# Patient Record
Sex: Male | Born: 1945 | Race: White | Hispanic: No | Marital: Married | Smoking: Never smoker
Health system: Southern US, Community
[De-identification: ages and names within clinical notes are randomized; demographics above are authoritative.]

## PROBLEM LIST (undated history)

## (undated) DIAGNOSIS — G4730 Sleep apnea, unspecified: Secondary | ICD-10-CM

## (undated) DIAGNOSIS — I10 Essential (primary) hypertension: Secondary | ICD-10-CM

## (undated) DIAGNOSIS — E7800 Pure hypercholesterolemia, unspecified: Secondary | ICD-10-CM

## (undated) DIAGNOSIS — G4733 Obstructive sleep apnea (adult) (pediatric): Secondary | ICD-10-CM

## (undated) DIAGNOSIS — J302 Other seasonal allergic rhinitis: Secondary | ICD-10-CM

## (undated) DIAGNOSIS — Z9289 Personal history of other medical treatment: Secondary | ICD-10-CM

## (undated) DIAGNOSIS — K219 Gastro-esophageal reflux disease without esophagitis: Secondary | ICD-10-CM

## (undated) DIAGNOSIS — R42 Dizziness and giddiness: Secondary | ICD-10-CM

## (undated) DIAGNOSIS — R011 Cardiac murmur, unspecified: Secondary | ICD-10-CM

## (undated) DIAGNOSIS — C8290 Follicular lymphoma, unspecified, unspecified site: Secondary | ICD-10-CM

## (undated) DIAGNOSIS — K649 Unspecified hemorrhoids: Secondary | ICD-10-CM

## (undated) HISTORY — DX: Sleep apnea, unspecified: G47.30

## (undated) HISTORY — PX: LYMPH NODE BIOPSY: SHX201

## (undated) HISTORY — PX: TONSILLECTOMY: SUR1361

## (undated) HISTORY — PX: COLONOSCOPY: SHX174

## (undated) HISTORY — DX: Unspecified hemorrhoids: K64.9

## (undated) HISTORY — DX: Gastro-esophageal reflux disease without esophagitis: K21.9

## (undated) HISTORY — PX: HEMORROIDECTOMY: SUR656

## (undated) HISTORY — PX: CATARACT EXTRACTION: SUR2

---

## 2007-04-07 DIAGNOSIS — C8290 Follicular lymphoma, unspecified, unspecified site: Secondary | ICD-10-CM

## 2007-04-07 HISTORY — DX: Follicular lymphoma, unspecified, unspecified site: C82.90

## 2011-02-20 ENCOUNTER — Ambulatory Visit: Payer: Self-pay | Admitting: Oncology

## 2011-02-24 ENCOUNTER — Ambulatory Visit: Payer: Self-pay | Admitting: Oncology

## 2011-03-07 ENCOUNTER — Ambulatory Visit: Payer: Self-pay | Admitting: Oncology

## 2011-04-07 ENCOUNTER — Ambulatory Visit: Payer: Self-pay | Admitting: Oncology

## 2011-04-21 ENCOUNTER — Ambulatory Visit: Payer: Self-pay | Admitting: Ophthalmology

## 2011-04-21 LAB — CREATININE, SERUM
Creatinine: 0.84 mg/dL (ref 0.60–1.30)
EGFR (African American): >60
EGFR (Non-African Amer.): >60

## 2011-04-29 ENCOUNTER — Ambulatory Visit: Payer: Self-pay | Admitting: Oncology

## 2011-05-08 ENCOUNTER — Ambulatory Visit: Payer: Self-pay | Admitting: Oncology

## 2011-05-20 LAB — CBC CANCER CENTER
Basophil #: 0 x10 3/mm (ref 0.0–0.1)
Eosinophil #: 0.1 x10 3/mm (ref 0.0–0.7)
Eosinophil %: 1.3 %
HGB: 14.2 g/dL (ref 13.0–18.0)
Lymphocyte %: 20.3 %
MCH: 30.8 pg (ref 26.0–34.0)
MCV: 90 fL (ref 80–100)
Monocyte %: 9.8 %
Neutrophil #: 3.8 x10 3/mm (ref 1.4–6.5)
Platelet: 181 x10 3/mm (ref 150–440)
RBC: 4.62 10*6/uL (ref 4.40–5.90)
RDW: 13.1 % (ref 11.5–14.5)

## 2011-05-27 LAB — CBC CANCER CENTER
Basophil #: 0 x10 3/mm (ref 0.0–0.1)
Eosinophil #: 0.1 x10 3/mm (ref 0.0–0.7)
Lymphocyte %: 21.4 %
MCHC: 34.1 g/dL (ref 32.0–36.0)
MCV: 90 fL (ref 80–100)
Neutrophil %: 66.5 %
RBC: 4.98 10*6/uL (ref 4.40–5.90)
RDW: 13.3 % (ref 11.5–14.5)

## 2011-06-03 LAB — CBC CANCER CENTER
Basophil %: 0.3 %
Eosinophil #: 0.1 x10 3/mm (ref 0.0–0.7)
Lymphocyte #: 1.3 x10 3/mm (ref 1.0–3.6)
Lymphocyte %: 19.3 %
MCHC: 34 g/dL (ref 32.0–36.0)
Monocyte #: 0.6 x10 3/mm (ref 0.0–0.7)
Monocyte %: 9.5 %
Neutrophil #: 4.6 x10 3/mm (ref 1.4–6.5)
Neutrophil %: 69.8 %
RBC: 4.77 10*6/uL (ref 4.40–5.90)
WBC: 6.6 x10 3/mm (ref 3.8–10.6)

## 2011-06-05 ENCOUNTER — Ambulatory Visit: Payer: Self-pay | Admitting: Oncology

## 2011-07-06 ENCOUNTER — Ambulatory Visit: Payer: Self-pay | Admitting: Oncology

## 2011-08-03 ENCOUNTER — Ambulatory Visit: Payer: Self-pay | Admitting: Oncology

## 2011-08-05 ENCOUNTER — Ambulatory Visit: Payer: Self-pay | Admitting: Oncology

## 2011-08-10 LAB — CBC CANCER CENTER
Basophil #: 0.1 x10 3/mm (ref 0.0–0.1)
Basophil %: 1.2 %
Eosinophil #: 0.1 x10 3/mm (ref 0.0–0.7)
Eosinophil %: 1.2 %
HCT: 42.3 % (ref 40.0–52.0)
HGB: 14.1 g/dL (ref 13.0–18.0)
MCHC: 33.3 g/dL (ref 32.0–36.0)
MCV: 91 fL (ref 80–100)
Monocyte #: 0.6 x10 3/mm (ref 0.2–1.0)
Neutrophil %: 68.9 %
Platelet: 181 x10 3/mm (ref 150–440)
WBC: 6.3 x10 3/mm (ref 3.8–10.6)

## 2011-08-10 LAB — LACTATE DEHYDROGENASE: LDH: 172 U/L (ref 87–241)

## 2011-09-05 ENCOUNTER — Ambulatory Visit: Payer: Self-pay | Admitting: Oncology

## 2011-10-05 ENCOUNTER — Ambulatory Visit: Payer: Self-pay | Admitting: Oncology

## 2011-11-05 ENCOUNTER — Ambulatory Visit: Payer: Self-pay | Admitting: Oncology

## 2011-12-14 ENCOUNTER — Ambulatory Visit: Payer: Self-pay | Admitting: Oncology

## 2011-12-16 ENCOUNTER — Ambulatory Visit: Payer: Self-pay | Admitting: Oncology

## 2011-12-16 LAB — CBC CANCER CENTER
Basophil #: 0.1 x10 3/mm (ref 0.0–0.1)
Basophil %: 1.2 %
Eosinophil #: 0.1 x10 3/mm (ref 0.0–0.7)
HCT: 36.3 % — ABNORMAL LOW (ref 40.0–52.0)
HGB: 11.6 g/dL — ABNORMAL LOW (ref 13.0–18.0)
Lymphocyte #: 1.1 x10 3/mm (ref 1.0–3.6)
Lymphocyte %: 16.2 %
MCHC: 32 g/dL (ref 32.0–36.0)
MCV: 88 fL (ref 80–100)
Monocyte %: 8.1 %
Neutrophil #: 4.8 x10 3/mm (ref 1.4–6.5)
Neutrophil %: 72.9 %
RBC: 4.15 10*6/uL — ABNORMAL LOW (ref 4.40–5.90)
WBC: 6.6 x10 3/mm (ref 3.8–10.6)

## 2011-12-16 LAB — LACTATE DEHYDROGENASE: LDH: 148 U/L (ref 81–234)

## 2012-01-05 ENCOUNTER — Ambulatory Visit: Payer: Self-pay | Admitting: Oncology

## 2012-01-11 ENCOUNTER — Ambulatory Visit: Payer: Self-pay | Admitting: Unknown Physician Specialty

## 2012-01-12 LAB — PATHOLOGY REPORT

## 2012-02-05 ENCOUNTER — Ambulatory Visit: Payer: Self-pay | Admitting: Oncology

## 2012-03-06 ENCOUNTER — Ambulatory Visit: Payer: Self-pay | Admitting: Oncology

## 2012-04-06 ENCOUNTER — Ambulatory Visit: Payer: Self-pay | Admitting: Oncology

## 2012-05-07 ENCOUNTER — Ambulatory Visit: Payer: Self-pay | Admitting: Oncology

## 2012-06-08 ENCOUNTER — Ambulatory Visit: Payer: Self-pay | Admitting: Oncology

## 2012-06-14 ENCOUNTER — Ambulatory Visit: Payer: Self-pay | Admitting: Oncology

## 2012-06-14 LAB — CBC CANCER CENTER
Basophil %: 1.4 %
Eosinophil #: 0.1 x10 3/mm (ref 0.0–0.7)
Lymphocyte #: 1.4 x10 3/mm (ref 1.0–3.6)
Lymphocyte %: 24.9 %
MCHC: 33.4 g/dL (ref 32.0–36.0)
RDW: 15.1 % — ABNORMAL HIGH (ref 11.5–14.5)
WBC: 5.5 x10 3/mm (ref 3.8–10.6)

## 2012-06-14 LAB — BASIC METABOLIC PANEL
Anion Gap: 10 (ref 7–16)
Chloride: 104 mmol/L (ref 98–107)
Co2: 28 mmol/L (ref 21–32)
EGFR (African American): >60
EGFR (Non-African Amer.): >60
Glucose: 103 mg/dL — ABNORMAL HIGH (ref 65–99)
Potassium: 3.5 mmol/L (ref 3.5–5.1)
Sodium: 142 mmol/L (ref 136–145)

## 2012-06-14 LAB — LACTATE DEHYDROGENASE: LDH: 159 U/L (ref 85–241)

## 2012-07-05 ENCOUNTER — Ambulatory Visit: Payer: Self-pay | Admitting: Oncology

## 2012-08-04 ENCOUNTER — Ambulatory Visit: Payer: Self-pay | Admitting: Oncology

## 2012-08-22 ENCOUNTER — Ambulatory Visit: Payer: Self-pay | Admitting: Oncology

## 2012-08-24 ENCOUNTER — Ambulatory Visit: Payer: Self-pay | Admitting: Oncology

## 2012-09-04 ENCOUNTER — Ambulatory Visit: Payer: Self-pay | Admitting: Oncology

## 2012-09-09 LAB — CBC CANCER CENTER
Bands: 2 %
Comment - H1-Com1: NORMAL
HGB: 14.3 g/dL (ref 13.0–18.0)
MCH: 30.7 pg (ref 26.0–34.0)
MCHC: 34.6 g/dL (ref 32.0–36.0)
Platelet: 209 x10 3/mm (ref 150–440)
RDW: 13.1 % (ref 11.5–14.5)
WBC: 7.6 x10 3/mm (ref 3.8–10.6)

## 2012-09-20 LAB — BASIC METABOLIC PANEL
Anion Gap: 8 (ref 7–16)
BUN: 8 mg/dL (ref 7–18)
Calcium, Total: 8.7 mg/dL (ref 8.5–10.1)
Co2: 27 mmol/L (ref 21–32)
Glucose: 183 mg/dL — ABNORMAL HIGH (ref 65–99)
Osmolality: 279 (ref 275–301)
Sodium: 138 mmol/L (ref 136–145)

## 2012-09-20 LAB — CBC CANCER CENTER
Lymphocyte #: 1.4 x10 3/mm (ref 1.0–3.6)
MCHC: 34.5 g/dL (ref 32.0–36.0)
MCV: 89 fL (ref 80–100)
Monocyte %: 7.9 %
Neutrophil %: 70.1 %
WBC: 7.5 x10 3/mm (ref 3.8–10.6)

## 2012-09-29 LAB — CBC CANCER CENTER
Basophil #: 0 x10 3/mm (ref 0.0–0.1)
Basophil %: 0.6 %
Eosinophil #: 0.1 x10 3/mm (ref 0.0–0.7)
HGB: 13.4 g/dL (ref 13.0–18.0)
Lymphocyte #: 1.1 x10 3/mm (ref 1.0–3.6)
MCV: 89 fL (ref 80–100)
Monocyte #: 0.7 x10 3/mm (ref 0.2–1.0)
Neutrophil %: 71.3 %
RDW: 13.1 % (ref 11.5–14.5)

## 2012-10-04 ENCOUNTER — Ambulatory Visit: Payer: Self-pay | Admitting: Oncology

## 2012-10-05 LAB — CBC CANCER CENTER
Basophil #: 0.1 x10 3/mm (ref 0.0–0.1)
Basophil %: 1 %
HCT: 43 % (ref 40.0–52.0)
HGB: 14.3 g/dL (ref 13.0–18.0)
MCH: 29.2 pg (ref 26.0–34.0)
MCHC: 33.2 g/dL (ref 32.0–36.0)
Neutrophil #: 5.2 x10 3/mm (ref 1.4–6.5)
Neutrophil %: 84.1 %
RDW: 13.4 % (ref 11.5–14.5)
WBC: 6.2 x10 3/mm (ref 3.8–10.6)

## 2012-10-12 LAB — CBC CANCER CENTER
Basophil #: 0.1 x10 3/mm (ref 0.0–0.1)
Basophil %: 1.3 %
Eosinophil %: 0.9 %
HCT: 43.9 % (ref 40.0–52.0)
HGB: 15.1 g/dL (ref 13.0–18.0)
Lymphocyte %: 12.7 %
MCV: 89 fL (ref 80–100)
Monocyte #: 0.6 x10 3/mm (ref 0.2–1.0)
Neutrophil %: 70.2 %
RBC: 4.94 10*6/uL (ref 4.40–5.90)
RDW: 13.6 % (ref 11.5–14.5)
WBC: 4.3 x10 3/mm (ref 3.8–10.6)

## 2012-10-19 LAB — CBC CANCER CENTER
Basophil #: 0.1 x10 3/mm (ref 0.0–0.1)
Basophil %: 1.4 %
Eosinophil #: 0 x10 3/mm (ref 0.0–0.7)
HCT: 42.3 % (ref 40.0–52.0)
HGB: 14.5 g/dL (ref 13.0–18.0)
Lymphocyte %: 14.5 %
MCHC: 34.3 g/dL (ref 32.0–36.0)
MCV: 89 fL (ref 80–100)
Monocyte %: 14.6 %
Neutrophil %: 68.4 %
Platelet: 96 x10 3/mm — ABNORMAL LOW (ref 150–440)
RDW: 14.3 % (ref 11.5–14.5)

## 2012-10-26 LAB — CBC CANCER CENTER
Basophil #: 0 x10 3/mm (ref 0.0–0.1)
Basophil %: 1.1 %
Eosinophil #: 0.1 x10 3/mm (ref 0.0–0.7)
HGB: 14.2 g/dL (ref 13.0–18.0)
MCH: 30.7 pg (ref 26.0–34.0)
MCHC: 34.4 g/dL (ref 32.0–36.0)
MCV: 89 fL (ref 80–100)
Monocyte #: 0.4 x10 3/mm (ref 0.2–1.0)
Monocyte %: 8.7 %
Platelet: 61 x10 3/mm — ABNORMAL LOW (ref 150–440)
RBC: 4.63 10*6/uL (ref 4.40–5.90)
WBC: 4.4 x10 3/mm (ref 3.8–10.6)

## 2012-11-02 LAB — CBC CANCER CENTER
Basophil %: 0.7 %
Eosinophil #: 0.1 x10 3/mm (ref 0.0–0.7)
Eosinophil %: 2.1 %
HGB: 13.8 g/dL (ref 13.0–18.0)
MCHC: 34.6 g/dL (ref 32.0–36.0)
MCV: 90 fL (ref 80–100)
Monocyte #: 0.4 x10 3/mm (ref 0.2–1.0)
Platelet: 57 x10 3/mm — ABNORMAL LOW (ref 150–440)
RBC: 4.41 10*6/uL (ref 4.40–5.90)
RDW: 15.1 % — ABNORMAL HIGH (ref 11.5–14.5)
WBC: 2.8 x10 3/mm — ABNORMAL LOW (ref 3.8–10.6)

## 2012-11-04 ENCOUNTER — Ambulatory Visit: Payer: Self-pay | Admitting: Oncology

## 2012-11-09 LAB — CBC CANCER CENTER
Eosinophil #: 0 x10 3/mm (ref 0.0–0.7)
HCT: 37.2 % — ABNORMAL LOW (ref 40.0–52.0)
Lymphocyte %: 31.7 %
MCH: 31.3 pg (ref 26.0–34.0)
MCHC: 35.2 g/dL (ref 32.0–36.0)
Monocyte %: 21.8 %
Neutrophil #: 0.8 x10 3/mm — ABNORMAL LOW (ref 1.4–6.5)
WBC: 1.9 x10 3/mm — CL (ref 3.8–10.6)

## 2012-11-16 LAB — CBC CANCER CENTER
Basophil #: 0 x10 3/mm (ref 0.0–0.1)
Basophil %: 1 %
Eosinophil #: 0 x10 3/mm (ref 0.0–0.7)
Eosinophil %: 0.4 %
HCT: 39.2 % — ABNORMAL LOW (ref 40.0–52.0)
HGB: 13.8 g/dL (ref 13.0–18.0)
Lymphocyte #: 0.7 x10 3/mm — ABNORMAL LOW (ref 1.0–3.6)
Lymphocyte %: 27.1 %
MCH: 31.7 pg (ref 26.0–34.0)
MCHC: 35.2 g/dL (ref 32.0–36.0)
MCV: 90 fL (ref 80–100)
Neutrophil #: 1.3 x10 3/mm — ABNORMAL LOW (ref 1.4–6.5)
Neutrophil %: 48.4 %
Platelet: 130 x10 3/mm — ABNORMAL LOW (ref 150–440)
RBC: 4.35 10*6/uL — ABNORMAL LOW (ref 4.40–5.90)
RDW: 16.3 % — ABNORMAL HIGH (ref 11.5–14.5)
WBC: 2.8 x10 3/mm — ABNORMAL LOW (ref 3.8–10.6)

## 2012-11-23 LAB — CBC CANCER CENTER
Eosinophil #: 0 x10 3/mm (ref 0.0–0.7)
Eosinophil %: 0.3 %
HCT: 39.8 % — ABNORMAL LOW (ref 40.0–52.0)
MCH: 31.7 pg (ref 26.0–34.0)
MCV: 91 fL (ref 80–100)
Monocyte #: 0.8 x10 3/mm (ref 0.2–1.0)
Monocyte %: 20 %
Neutrophil #: 2.3 x10 3/mm (ref 1.4–6.5)
Platelet: 181 x10 3/mm (ref 150–440)
RBC: 4.37 10*6/uL — ABNORMAL LOW (ref 4.40–5.90)
WBC: 3.8 x10 3/mm (ref 3.8–10.6)

## 2012-11-29 LAB — CBC CANCER CENTER
Basophil #: 0 x10 3/mm (ref 0.0–0.1)
Eosinophil #: 0 x10 3/mm (ref 0.0–0.7)
Eosinophil %: 0.5 %
Lymphocyte #: 0.8 x10 3/mm — ABNORMAL LOW (ref 1.0–3.6)
Lymphocyte %: 20.4 %
MCH: 31.5 pg (ref 26.0–34.0)
MCHC: 34.6 g/dL (ref 32.0–36.0)
Monocyte #: 0.8 x10 3/mm (ref 0.2–1.0)
RDW: 17.6 % — ABNORMAL HIGH (ref 11.5–14.5)
WBC: 4 x10 3/mm (ref 3.8–10.6)

## 2012-12-05 ENCOUNTER — Ambulatory Visit: Payer: Self-pay | Admitting: Oncology

## 2012-12-07 LAB — CBC CANCER CENTER
Basophil #: 0 x10 3/mm (ref 0.0–0.1)
Eosinophil #: 0 x10 3/mm (ref 0.0–0.7)
Eosinophil %: 0.6 %
HCT: 39 % — ABNORMAL LOW (ref 40.0–52.0)
Lymphocyte #: 0.8 x10 3/mm — ABNORMAL LOW (ref 1.0–3.6)
MCH: 31.8 pg (ref 26.0–34.0)
MCHC: 34.6 g/dL (ref 32.0–36.0)
Monocyte #: 0.7 x10 3/mm (ref 0.2–1.0)
Monocyte %: 12.9 %
Neutrophil #: 3.6 x10 3/mm (ref 1.4–6.5)
Neutrophil %: 69.6 %
Platelet: 181 x10 3/mm (ref 150–440)
RBC: 4.24 10*6/uL — ABNORMAL LOW (ref 4.40–5.90)
RDW: 17.5 % — ABNORMAL HIGH (ref 11.5–14.5)
WBC: 5.1 x10 3/mm (ref 3.8–10.6)

## 2012-12-14 LAB — BASIC METABOLIC PANEL
Anion Gap: 9 (ref 7–16)
Calcium, Total: 8.9 mg/dL (ref 8.5–10.1)
Chloride: 106 mmol/L (ref 98–107)
Co2: 29 mmol/L (ref 21–32)
EGFR (African American): >60
EGFR (Non-African Amer.): >60
Glucose: 102 mg/dL — ABNORMAL HIGH (ref 65–99)

## 2012-12-14 LAB — CBC CANCER CENTER
Basophil #: 0.1 x10 3/mm (ref 0.0–0.1)
Eosinophil #: 0.1 x10 3/mm (ref 0.0–0.7)
Eosinophil %: 1.5 %
HGB: 13.8 g/dL (ref 13.0–18.0)
Lymphocyte %: 13.1 %
MCH: 31.5 pg (ref 26.0–34.0)
MCHC: 34.1 g/dL (ref 32.0–36.0)
MCV: 92 fL (ref 80–100)
Monocyte #: 0.8 x10 3/mm (ref 0.2–1.0)
Monocyte %: 14 %
Neutrophil %: 70.2 %
RBC: 4.4 10*6/uL (ref 4.40–5.90)
RDW: 17 % — ABNORMAL HIGH (ref 11.5–14.5)

## 2012-12-14 LAB — LACTATE DEHYDROGENASE: LDH: 182 U/L (ref 85–241)

## 2012-12-21 LAB — CBC CANCER CENTER
Eosinophil %: 2.1 %
HCT: 40.6 % (ref 40.0–52.0)
Lymphocyte %: 18.7 %
MCH: 31.8 pg (ref 26.0–34.0)
MCHC: 34.1 g/dL (ref 32.0–36.0)
Monocyte #: 0.9 x10 3/mm (ref 0.2–1.0)
Monocyte %: 15.3 %
Neutrophil %: 63.2 %
Platelet: 175 x10 3/mm (ref 150–440)
WBC: 5.6 x10 3/mm (ref 3.8–10.6)

## 2012-12-29 ENCOUNTER — Ambulatory Visit: Payer: Self-pay | Admitting: Oncology

## 2013-01-04 ENCOUNTER — Ambulatory Visit: Payer: Self-pay | Admitting: Oncology

## 2013-02-04 ENCOUNTER — Ambulatory Visit: Payer: Self-pay | Admitting: Oncology

## 2013-02-14 ENCOUNTER — Ambulatory Visit: Payer: Self-pay | Admitting: Internal Medicine

## 2013-02-15 ENCOUNTER — Ambulatory Visit: Payer: Self-pay | Admitting: Oncology

## 2013-02-23 ENCOUNTER — Ambulatory Visit: Payer: Self-pay | Admitting: Unknown Physician Specialty

## 2013-03-06 ENCOUNTER — Ambulatory Visit: Payer: Self-pay | Admitting: Oncology

## 2013-04-06 ENCOUNTER — Ambulatory Visit: Payer: Self-pay | Admitting: Oncology

## 2013-04-10 ENCOUNTER — Ambulatory Visit: Payer: Self-pay | Admitting: Oncology

## 2013-04-19 ENCOUNTER — Ambulatory Visit: Payer: Self-pay | Admitting: Oncology

## 2013-04-19 LAB — CBC CANCER CENTER
BASOS PCT: 1.4 %
Basophil #: 0.1 x10 3/mm (ref 0.0–0.1)
Eosinophil #: 0.1 x10 3/mm (ref 0.0–0.7)
Eosinophil %: 1.6 %
HCT: 42.1 % (ref 40.0–52.0)
HGB: 14.1 g/dL (ref 13.0–18.0)
LYMPHS ABS: 1.2 x10 3/mm (ref 1.0–3.6)
LYMPHS PCT: 22.4 %
MCH: 30.8 pg (ref 26.0–34.0)
MCHC: 33.4 g/dL (ref 32.0–36.0)
MCV: 92 fL (ref 80–100)
MONOS PCT: 8.9 %
Monocyte #: 0.5 x10 3/mm (ref 0.2–1.0)
NEUTROS ABS: 3.5 x10 3/mm (ref 1.4–6.5)
Neutrophil %: 65.7 %
Platelet: 158 x10 3/mm (ref 150–440)
RBC: 4.57 10*6/uL (ref 4.40–5.90)
RDW: 12.3 % (ref 11.5–14.5)
WBC: 5.3 x10 3/mm (ref 3.8–10.6)

## 2013-04-19 LAB — COMPREHENSIVE METABOLIC PANEL
Albumin: 3.9 g/dL (ref 3.4–5.0)
Alkaline Phosphatase: 81 U/L
Anion Gap: 10 (ref 7–16)
BILIRUBIN TOTAL: 0.6 mg/dL (ref 0.2–1.0)
BUN: 10 mg/dL (ref 7–18)
CALCIUM: 8.7 mg/dL (ref 8.5–10.1)
Chloride: 103 mmol/L (ref 98–107)
Co2: 29 mmol/L (ref 21–32)
Creatinine: 0.95 mg/dL (ref 0.60–1.30)
EGFR (African American): >60
EGFR (Non-African Amer.): >60
Glucose: 95 mg/dL (ref 65–99)
OSMOLALITY: 282 (ref 275–301)
POTASSIUM: 3.5 mmol/L (ref 3.5–5.1)
SGOT(AST): 25 U/L (ref 15–37)
SGPT (ALT): 33 U/L (ref 12–78)
Sodium: 142 mmol/L (ref 136–145)
TOTAL PROTEIN: 6.4 g/dL (ref 6.4–8.2)

## 2013-04-19 LAB — LACTATE DEHYDROGENASE: LDH: 148 U/L (ref 85–241)

## 2013-05-07 ENCOUNTER — Ambulatory Visit: Payer: Self-pay | Admitting: Oncology

## 2013-06-04 ENCOUNTER — Ambulatory Visit: Payer: Self-pay | Admitting: Oncology

## 2013-07-12 ENCOUNTER — Ambulatory Visit: Payer: Self-pay | Admitting: Oncology

## 2013-07-12 LAB — CBC CANCER CENTER
Basophil #: 0.1 10*3/uL
Basophil %: 1.4 %
Eosinophil #: 0.1 10*3/uL
Eosinophil %: 1.1 %
HCT: 41 %
HGB: 13.7 g/dL
Lymphocyte %: 22.2 %
Lymphs Abs: 1.1 10*3/uL
MCH: 31 pg
MCHC: 33.5 g/dL
MCV: 92 fL
Monocyte #: 0.4 10*3/uL
Monocyte %: 8.7 %
Neutrophil #: 3.2 10*3/uL
Neutrophil %: 66.6 %
Platelet: 153 10*3/uL
RBC: 4.43 10*6/uL
RDW: 13.2 %
WBC: 4.8 10*3/uL

## 2013-07-12 LAB — LACTATE DEHYDROGENASE: LDH: 139 U/L

## 2013-08-04 ENCOUNTER — Ambulatory Visit: Payer: Self-pay | Admitting: Oncology

## 2013-08-22 DIAGNOSIS — K219 Gastro-esophageal reflux disease without esophagitis: Secondary | ICD-10-CM | POA: Insufficient documentation

## 2013-08-22 DIAGNOSIS — G4730 Sleep apnea, unspecified: Secondary | ICD-10-CM | POA: Insufficient documentation

## 2013-08-22 DIAGNOSIS — I10 Essential (primary) hypertension: Secondary | ICD-10-CM | POA: Insufficient documentation

## 2013-08-22 DIAGNOSIS — E785 Hyperlipidemia, unspecified: Secondary | ICD-10-CM | POA: Insufficient documentation

## 2013-08-22 DIAGNOSIS — C8590 Non-Hodgkin lymphoma, unspecified, unspecified site: Secondary | ICD-10-CM | POA: Insufficient documentation

## 2013-08-22 DIAGNOSIS — C859A Non-Hodgkin lymphoma, unspecified, in remission: Secondary | ICD-10-CM | POA: Insufficient documentation

## 2013-09-04 ENCOUNTER — Ambulatory Visit: Payer: Self-pay | Admitting: Oncology

## 2013-10-04 ENCOUNTER — Ambulatory Visit: Payer: Self-pay | Admitting: Oncology

## 2013-10-25 ENCOUNTER — Ambulatory Visit: Payer: Self-pay | Admitting: Ophthalmology

## 2013-11-14 ENCOUNTER — Ambulatory Visit: Payer: Self-pay | Admitting: Oncology

## 2013-11-27 ENCOUNTER — Ambulatory Visit: Payer: Self-pay | Admitting: Oncology

## 2013-11-27 LAB — BASIC METABOLIC PANEL
Anion Gap: 8 (ref 7–16)
BUN: 10 mg/dL (ref 7–18)
CHLORIDE: 104 mmol/L (ref 98–107)
CREATININE: 1.05 mg/dL (ref 0.60–1.30)
Calcium, Total: 8.8 mg/dL (ref 8.5–10.1)
Co2: 29 mmol/L (ref 21–32)
EGFR (African American): >60
Glucose: 105 mg/dL — ABNORMAL HIGH (ref 65–99)
OSMOLALITY: 281 (ref 275–301)
POTASSIUM: 3.9 mmol/L (ref 3.5–5.1)
SODIUM: 141 mmol/L (ref 136–145)

## 2013-11-27 LAB — CBC CANCER CENTER
Basophil #: 0 x10 3/mm (ref 0.0–0.1)
Basophil %: 0.6 %
EOS ABS: 0.1 x10 3/mm (ref 0.0–0.7)
EOS PCT: 1.8 %
HCT: 43 % (ref 40.0–52.0)
HGB: 14.4 g/dL (ref 13.0–18.0)
Lymphocyte #: 1.7 x10 3/mm (ref 1.0–3.6)
Lymphocyte %: 27.3 %
MCH: 30.6 pg (ref 26.0–34.0)
MCHC: 33.4 g/dL (ref 32.0–36.0)
MCV: 92 fL (ref 80–100)
Monocyte #: 0.6 x10 3/mm (ref 0.2–1.0)
Monocyte %: 10.5 %
NEUTROS ABS: 3.6 x10 3/mm (ref 1.4–6.5)
NEUTROS PCT: 59.8 %
Platelet: 153 x10 3/mm (ref 150–440)
RBC: 4.7 10*6/uL (ref 4.40–5.90)
RDW: 12.9 % (ref 11.5–14.5)
WBC: 6 x10 3/mm (ref 3.8–10.6)

## 2013-11-27 LAB — LACTATE DEHYDROGENASE: LDH: 165 U/L (ref 85–241)

## 2013-12-05 ENCOUNTER — Ambulatory Visit: Payer: Self-pay | Admitting: Oncology

## 2014-01-04 ENCOUNTER — Ambulatory Visit: Payer: Self-pay | Admitting: Oncology

## 2014-02-07 ENCOUNTER — Ambulatory Visit: Payer: Self-pay | Admitting: Oncology

## 2014-03-06 ENCOUNTER — Ambulatory Visit: Payer: Self-pay | Admitting: Oncology

## 2014-03-07 LAB — BASIC METABOLIC PANEL
Anion Gap: 9 (ref 7–16)
BUN: 9 mg/dL (ref 7–18)
Calcium, Total: 8.6 mg/dL (ref 8.5–10.1)
Chloride: 106 mmol/L (ref 98–107)
Co2: 27 mmol/L (ref 21–32)
Creatinine: 1.06 mg/dL (ref 0.60–1.30)
EGFR (African American): >60
Glucose: 100 mg/dL — ABNORMAL HIGH (ref 65–99)
Osmolality: 282 (ref 275–301)
Potassium: 3.7 mmol/L (ref 3.5–5.1)
SODIUM: 142 mmol/L (ref 136–145)

## 2014-03-07 LAB — CBC CANCER CENTER
BASOS ABS: 0.1 x10 3/mm (ref 0.0–0.1)
Basophil %: 1.1 %
EOS ABS: 0.1 x10 3/mm (ref 0.0–0.7)
Eosinophil %: 0.9 %
HCT: 42.5 % (ref 40.0–52.0)
HGB: 14 g/dL (ref 13.0–18.0)
Lymphocyte #: 1.2 x10 3/mm (ref 1.0–3.6)
Lymphocyte %: 19.4 %
MCH: 30.2 pg (ref 26.0–34.0)
MCHC: 32.8 g/dL (ref 32.0–36.0)
MCV: 92 fL (ref 80–100)
Monocyte #: 0.5 x10 3/mm (ref 0.2–1.0)
Monocyte %: 8.4 %
Neutrophil #: 4.3 x10 3/mm (ref 1.4–6.5)
Neutrophil %: 70.2 %
Platelet: 145 x10 3/mm — ABNORMAL LOW (ref 150–440)
RBC: 4.63 10*6/uL (ref 4.40–5.90)
RDW: 13.1 % (ref 11.5–14.5)
WBC: 6.2 x10 3/mm (ref 3.8–10.6)

## 2014-03-07 LAB — LACTATE DEHYDROGENASE: LDH: 165 U/L (ref 85–241)

## 2014-04-06 ENCOUNTER — Ambulatory Visit: Payer: Self-pay | Admitting: Oncology

## 2014-05-07 ENCOUNTER — Ambulatory Visit: Payer: Self-pay | Admitting: Oncology

## 2014-06-06 ENCOUNTER — Ambulatory Visit: Payer: Self-pay | Admitting: Oncology

## 2014-06-06 ENCOUNTER — Ambulatory Visit
Admit: 2014-06-06 | Disposition: A | Discharge status: Home / Self Care | Payer: Self-pay | Attending: Oncology | Admitting: Oncology

## 2014-07-06 ENCOUNTER — Ambulatory Visit
Admit: 2014-07-06 | Disposition: A | Discharge status: Home / Self Care | Payer: Self-pay | Attending: Oncology | Admitting: Oncology

## 2014-07-29 NOTE — Consult Note (Signed)
Reason for Visit: This 69 year old Male patient presents to the clinic for initial evaluation of  Malignant lymphoma of right orbit .   Referred by Dr. Grayland Ormond.  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 CHOP with complete response. He was on maintenance Rituxan with his last infusion in April 2012. He recently noted noticed some protrusion of his right orbit seen by ophthalmologist to brain MRI scan showing a mass lesion in the right orbit consistent with progressivelymphoma. PET scan was performed showing avid uptake in the left orbit also a small area superficially in the left temple region. No other significant evidence of disease. Patient is having no fever chills or night sweats at this time. He is referred to radiation oncology for consideration of treatment. His case was presented at our weekly tumor conference and treatment recommendations for palliative radiation therapy were made.patient this time is having no diplopia or any change in visual fields.  Allergies:   No Known Allergies:   Home Meds:  Home Medications:  Norvasc 5 mg oral tablet: 1 tab(s) orally once a day, Active  Lipitor 10 mg oral tablet: 1 tab(s) orally once a day (at bedtime), Active  Prilosec 40 mg oral delayed release capsule: 1 cap(s) orally once a day, Active  Calcium 600+D 600 mg-200 intl units oral tablet: 1 tab(s) orally once a day, Active  Viagra 50 mg oral tablet: 1 tab(s) orally once a day, As Needed, Active  Review of Systems:   General negative    Performance Status  (ECOG) 0    Skin see HPI    Breast negative    Ophthalmologic see HPI    ENMT negative    Respiratory and Thorax negative    Cardiovascular negative    Gastrointestinal negative    Genitourinary negative    Musculoskeletal negative    Neurological negative    Hematology/Lymphatics negative    Endocrine negative    Allergic/Immunologic negative   Physical Exam:  General/Skin/HEENT:   General normal    Additional PE Well-developed male in NAD. He has obvious ptosis of his right orbit. He also small patchy infiltrate in the left temple region. No other evidence of some digastric cervical or supraclavicular adenopathy is identified. Oral cavity is clear no oral mucosal lesions are identified. Indirect mirror examination shows upper airway clear vallecula and base of tongue within normal limits. Crude visual fields are within normal range radial nerves II through XII are grossly intact.   Breasts/Resp/CV/GI/GU:   Respiratory and Thorax normal    Cardiovascular normal    Gastrointestinal normal    Genitourinary normal   MS/Neuro/Psych/Lymph:   Musculoskeletal normal    Neurological normal    Lymphatics normal   Other Results:  Radiology Results: MRI:    15-Jan-13 13:34, MRI Brain* and  Orbits WWO   MRI Brain* and  Orbits WWO    REASON FOR EXAM:    proptosis right eye x 1 month new onset Hx of lymphoma  COMMENTS:       PROCEDURE: MR  -  MR BRAIN AND ORBITS W/WO CONTR  - Apr 21 2011  1:34PM     RESULT: History: Proptosis    Technique: Multiplanar, multisequence MRI of the brain and orbits was   obtained without IV contrast.     Comparison:  None    Findings:     There is no acute infarct. There is no hemorrhage. There is no pathologic     extra-axial fluid collection.  There is no hydrocephalus. The ventricles   are normalfor age. The basal cisterns are patent. There are no abnormal   extra-axial fluid collections.     There is a 2 x 3 cm soft  tissue mass along the superolateral aspect of   the right orbit with a portion which appears to be postseptal and a small   portion which is preseptal resulting in proptosis. The mass appears arise   from the right lacrimal gland. Differential diagnosis includes lymphoma   versus granulomatous disease versus infection.    The visualized paranasal sinuses and mastoid sinuses are clear. The skull   base and calvarium demonstrate normal signal. The major intracranial flow   voids, including dural venous sinuses appear patent.    IMPRESSION:   There is a 2 x 3 cm soft tissue mass along the superolateral aspect of   the rightorbit with a portion which appears to be postseptal and a small   portion which is preseptal resulting in proptosis. The mass appears to   arise from the right lacrimal gland. Differential diagnosis includes   lymphoma versus granulomatous disease versus infection.          Verified By: Jennette Banker, M.D., MD  Nuclear Med:    23-Jan-13 14:21, PET/CT Scan Lymphoma Restaging   PET/CT Scan Lymphoma Restaging    REASON FOR EXAM:    Restaging lymphoma  COMMENTS:       PROCEDURE: PET - PET/CT RESTAGING LYMPHOMA  - Apr 29 2011  2:21PM       RESULT:     History: Lymphoma.    Comparison Study: No prior.    Findings: Following determination of a fasting blood sugar of 85   mg/deciliter, 12.52 mCi F-18 FDG was administered to the patient. CT was   obtained for attenuation correction and fusion.   Noted is enlargement and increased FDG uptake in the lateral rectus   muscle in the right orbit. Similar finding is noted along the lateral   aspect of the left orbit within the subcutaneous region on the left.     Noted anterior to the stomach is a tiny 1.3 cm solid nodule that is PET   positive with SUV of 4.6. Tiny borderline lymph nodes are noted in the   groin. Borderline lesion is noted in the left breast.     IMPRESSION:      1. Diffusely enlarged PET positive  lateral rectus muscle right orbit.   Soft tissue density noted adjacent to the left orbit in the subcutaneous   region. This is PET positive.  2. PET positive tiny nodule anterior to the stomach.  3. Borderline PET positive right inguinal lymph nodes and left breast     lesion.      Thank you for the opportunity to contribute to the care of your patient.           Verified By: Osa Craver, M.D., MD   Assessment and Plan:  Impression:   ocular recurrence of known B-cell lymphoma in the right orbit  a 69 year old male  Plan:   at this time I recommended external beam radiation therapy to the right orbit. We will plan on delivering 3600 cGy in 20 fractions using IMRT to spare and distal optic nerve as well as cochlea, spinal cord and lacrimal gland region. Risks and benefits of treatment were reviewed with the patient and his wife in both seem to comprehend her treatment plan well. I have set him up for CT simulation later this week. He does have another lesion which is possibly lymphoma in the left temple region which is fairly subcutaneous and would be amenable to electron beam to this area continues to progress. Have talked the case over with Dr. Grayland Ormond who will probably put him back on Rituxan. If disease continues to progress and was treat with radiopharmaceutical for CD20 positive disease.  I would like to take this opportunity to thank you for allowing me to continue to participate in this patient's care.  CC Referral:  cc:: Dr. Rowland Lathe   Electronic Signatures: Baruch Gouty Roda Shutters (MD)  (Signed 28-Jan-13 15:31)  Authored: HPI, Diagnosis, Allergies, Home Meds, ROS, Physical Exam, Other Results, Encounter Assessment and Plan, CC Referring Physician   Last Updated: 28-Jan-13 15:31 by Armstead Peaks (MD)

## 2014-09-05 ENCOUNTER — Inpatient Hospital Stay: Payer: Medicare Other | Attending: Oncology

## 2014-09-05 ENCOUNTER — Other Ambulatory Visit: Payer: Self-pay | Admitting: *Deleted

## 2014-09-05 ENCOUNTER — Inpatient Hospital Stay: Payer: Medicare Other

## 2014-09-05 DIAGNOSIS — Z8572 Personal history of non-Hodgkin lymphomas: Secondary | ICD-10-CM | POA: Diagnosis present

## 2014-09-05 DIAGNOSIS — C8591 Non-Hodgkin lymphoma, unspecified, lymph nodes of head, face, and neck: Secondary | ICD-10-CM

## 2014-09-05 DIAGNOSIS — Z95828 Presence of other vascular implants and grafts: Secondary | ICD-10-CM

## 2014-09-05 LAB — LACTATE DEHYDROGENASE: LDH: 132 U/L (ref 98–192)

## 2014-09-05 LAB — BASIC METABOLIC PANEL
Anion gap: 5 (ref 5–15)
BUN: 10 mg/dL (ref 6–20)
CALCIUM: 8.2 mg/dL — AB (ref 8.9–10.3)
CO2: 27 mmol/L (ref 22–32)
Chloride: 102 mmol/L (ref 101–111)
Creatinine, Ser: 0.91 mg/dL (ref 0.61–1.24)
GFR calc Af Amer: >60 mL/min (ref >60)
GLUCOSE: 100 mg/dL — AB (ref 65–99)
Potassium: 3.5 mmol/L (ref 3.5–5.1)
SODIUM: 134 mmol/L — AB (ref 135–145)

## 2014-09-05 LAB — CBC WITH DIFFERENTIAL/PLATELET
BASOS ABS: 0.1 10*3/uL (ref 0–0.1)
Basophils Relative: 1 %
Eosinophils Absolute: 0.1 10*3/uL (ref 0–0.7)
Eosinophils Relative: 2 %
HCT: 41.1 % (ref 40.0–52.0)
Hemoglobin: 13.8 g/dL (ref 13.0–18.0)
LYMPHS PCT: 27 %
Lymphs Abs: 1.3 10*3/uL (ref 1.0–3.6)
MCH: 30.5 pg (ref 26.0–34.0)
MCHC: 33.6 g/dL (ref 32.0–36.0)
MCV: 90.8 fL (ref 80.0–100.0)
Monocytes Absolute: 0.4 10*3/uL (ref 0.2–1.0)
Monocytes Relative: 9 %
NEUTROS ABS: 2.8 10*3/uL (ref 1.4–6.5)
NEUTROS PCT: 61 %
PLATELETS: 135 10*3/uL — AB (ref 150–440)
RBC: 4.53 MIL/uL (ref 4.40–5.90)
RDW: 13.3 % (ref 11.5–14.5)
WBC: 4.6 10*3/uL (ref 3.8–10.6)

## 2014-09-05 MED ORDER — SODIUM CHLORIDE 0.9 % IJ SOLN
10.0000 mL | INTRAMUSCULAR | Status: DC | PRN
  Administered 2014-09-05: 10 mL via INTRAVENOUS
  Filled 2014-09-05: qty 10

## 2014-09-05 MED ORDER — HEPARIN SOD (PORK) LOCK FLUSH 100 UNIT/ML IV SOLN
500.0000 [IU] | Freq: Once | INTRAVENOUS | Status: AC
  Administered 2014-09-05: 500 [IU] via INTRAVENOUS
  Filled 2014-09-05: qty 5

## 2014-09-07 ENCOUNTER — Other Ambulatory Visit: Payer: Self-pay | Admitting: Oncology

## 2014-09-07 ENCOUNTER — Encounter: Payer: Self-pay | Admitting: Oncology

## 2014-09-07 ENCOUNTER — Inpatient Hospital Stay: Payer: Medicare Other | Attending: Oncology | Admitting: Oncology

## 2014-09-07 VITALS — BP 134/65 | HR 86 | Temp 98.0°F | Resp 18 | Wt 210.5 lb

## 2014-09-07 DIAGNOSIS — C8290 Follicular lymphoma, unspecified, unspecified site: Secondary | ICD-10-CM

## 2014-09-07 DIAGNOSIS — Z801 Family history of malignant neoplasm of trachea, bronchus and lung: Secondary | ICD-10-CM | POA: Diagnosis not present

## 2014-09-07 DIAGNOSIS — Z9221 Personal history of antineoplastic chemotherapy: Secondary | ICD-10-CM | POA: Diagnosis not present

## 2014-09-07 DIAGNOSIS — Z800 Family history of malignant neoplasm of digestive organs: Secondary | ICD-10-CM | POA: Diagnosis not present

## 2014-09-07 DIAGNOSIS — Z79899 Other long term (current) drug therapy: Secondary | ICD-10-CM | POA: Diagnosis not present

## 2014-09-07 DIAGNOSIS — Z7982 Long term (current) use of aspirin: Secondary | ICD-10-CM

## 2014-09-07 DIAGNOSIS — K219 Gastro-esophageal reflux disease without esophagitis: Secondary | ICD-10-CM | POA: Diagnosis not present

## 2014-09-07 DIAGNOSIS — G4730 Sleep apnea, unspecified: Secondary | ICD-10-CM

## 2014-10-06 NOTE — Progress Notes (Signed)
[facility]  Telephone:([phone]) [phone] [phone]) [phone]  ID: Sean Raymond OB: October 29, 1945  MR#: [id number]  KGU#:[id number]  No care team member to display  CHIEF COMPLAINT: Stage IV, grade 2, follicular lymphoma.  INTERVAL HISTORY: Patient returns to clinic today for repeat laboratory work and further evaluation.  He no longer complains of visual issues with his left eye.  He has no neurologic complaints.  He denies any fevers, weight loss, or night sweats.  He has noted no new lymphadenopathy.  He denies any chest pain or shortness of breath.  He denies any nausea, vomiting, constipation, or diarrhea.  He has no urinary complaints.  Patient offers no specific complaints today.  REVIEW OF SYSTEMS:   Review of Systems  Constitutional: Negative for fever, weight loss and diaphoresis.  Eyes: Negative.   Neurological: Negative.  Negative for weakness.    As per HPI. Otherwise, a complete review of systems is negatve.  PAST MEDICAL HISTORY: Past Medical History  Diagnosis Date  . Sleep apnea   . GERD (gastroesophageal reflux disease)   . Hemorrhoids     PAST SURGICAL HISTORY: Hemorrhoidectomy.  FAMILY HISTORY: Colon cancer and lung cancer.     ADVANCED DIRECTIVES:    HEALTH MAINTENANCE: History  Substance Use Topics  . Smoking status: Never Smoker   . Smokeless tobacco: Never Used  . Alcohol Use: Yes     Comment: occasional     Colonoscopy:  PAP:  Bone density:  Lipid panel:  No Known Allergies  Current Outpatient Prescriptions  Medication Sig Dispense Refill  . amLODipine (NORVASC) 5 MG tablet     . aspirin EC 81 MG tablet Take by mouth.    Marland Kitchen atorvastatin (LIPITOR) 10 MG tablet     . Calcium Carbonate-Vitamin D 600-400 MG-UNIT per tablet Take by mouth.    . Multiple Vitamin (MULTI-VITAMINS) TABS Take by mouth.    Marland Kitchen omeprazole (PRILOSEC) 40 MG capsule     . oxybutynin (DITROPAN) 5 MG tablet     . prednisoLONE acetate (PRED FORTE) 1 %  ophthalmic suspension     . sildenafil (VIAGRA) 100 MG tablet Take by mouth.     No current facility-administered medications for this visit.    OBJECTIVE: Filed Vitals:   09/07/14 1200  BP: 134/65  Pulse: 86  Temp: 98 F (36.7 C)  Resp: 18     There is no height on file to calculate BMI.    ECOG FS:0 - Asymptomatic  General: Well-developed, well-nourished, no acute distress. Eyes: anicteric sclera. Neck: No palpable lymphadenopathy. Lungs: Clear to auscultation bilaterally. Heart: Regular rate and rhythm. No rubs, murmurs, or gallops. Abdomen: Soft, nontender, nondistended. No organomegaly noted, normoactive bowel sounds. Musculoskeletal: No edema, cyanosis, or clubbing. Neuro: Alert, answering all questions appropriately. Cranial nerves grossly intact. Skin: No rashes or petechiae noted. Psych: Normal affect. Lymphatics: No cervical, calvicular, axillary or inguinal LAD.   LAB RESULTS:  Lab Results  Component Value Date   NA 134* 09/05/2014   K 3.5 09/05/2014   CL 102 09/05/2014   CO2 27 09/05/2014   GLUCOSE 100* 09/05/2014   BUN 10 09/05/2014   CREATININE 0.91 09/05/2014   CALCIUM 8.2* 09/05/2014   PROT 6.4 04/19/2013   ALBUMIN 3.9 04/19/2013   AST 25 04/19/2013   ALT 33 04/19/2013   ALKPHOS 81 04/19/2013   GFRNONAA >60 09/05/2014   GFRAA >60 09/05/2014    Lab Results  Component Value Date   WBC 4.6 09/05/2014  NEUTROABS 2.8 09/05/2014   HGB 13.8 09/05/2014   HCT 41.1 09/05/2014   MCV 90.8 09/05/2014   PLT 135* 09/05/2014     STUDIES: No results found.  ASSESSMENT: Recurrent, stage IV grade 2 follicular lymphoma.  PLAN:    1.  Follicular lymphoma: Patient completed his rituximab and Zevalin therapy in June 2014.  Patient had recurrence in his right orbit. CT scan results in March 2016 were reviewed independently and reported as no evidence of disease.  Reimage in 3 months for further evaluation at which point, patient can be switched to yearly  imaging with follow-up every 6 months. Return to clinic in 3 months with laboratory work and further evaluation.    Patient expressed understanding and was in agreement with this plan. He also understands that He can call clinic at any time with any questions, concerns, or complaints.    Lloyd Huger, MD   10/06/2014 1:04 PM

## 2014-10-10 ENCOUNTER — Inpatient Hospital Stay: Payer: Medicare Other | Attending: Oncology

## 2014-10-10 DIAGNOSIS — C8590 Non-Hodgkin lymphoma, unspecified, unspecified site: Secondary | ICD-10-CM

## 2014-10-10 DIAGNOSIS — Z8572 Personal history of non-Hodgkin lymphomas: Secondary | ICD-10-CM | POA: Insufficient documentation

## 2014-10-10 DIAGNOSIS — Z452 Encounter for adjustment and management of vascular access device: Secondary | ICD-10-CM | POA: Diagnosis not present

## 2014-10-10 MED ORDER — SODIUM CHLORIDE 0.9 % IJ SOLN
10.0000 mL | INTRAMUSCULAR | Status: DC | PRN
  Administered 2014-10-10: 10 mL via INTRAVENOUS
  Filled 2014-10-10: qty 10

## 2014-10-10 MED ORDER — HEPARIN SOD (PORK) LOCK FLUSH 100 UNIT/ML IV SOLN
500.0000 [IU] | Freq: Once | INTRAVENOUS | Status: AC
  Administered 2014-10-10: 500 [IU] via INTRAVENOUS
  Filled 2014-10-10: qty 5

## 2014-11-21 ENCOUNTER — Inpatient Hospital Stay: Payer: Medicare Other | Attending: Oncology

## 2014-11-21 DIAGNOSIS — Z9221 Personal history of antineoplastic chemotherapy: Secondary | ICD-10-CM | POA: Insufficient documentation

## 2014-11-21 DIAGNOSIS — Z95828 Presence of other vascular implants and grafts: Secondary | ICD-10-CM

## 2014-11-21 DIAGNOSIS — C8290 Follicular lymphoma, unspecified, unspecified site: Secondary | ICD-10-CM | POA: Insufficient documentation

## 2014-11-21 DIAGNOSIS — Z452 Encounter for adjustment and management of vascular access device: Secondary | ICD-10-CM | POA: Diagnosis not present

## 2014-11-21 MED ORDER — SODIUM CHLORIDE 0.9 % IJ SOLN
10.0000 mL | INTRAMUSCULAR | Status: DC | PRN
  Administered 2014-11-21: 10 mL via INTRAVENOUS
  Filled 2014-11-21: qty 10

## 2014-11-21 MED ORDER — HEPARIN SOD (PORK) LOCK FLUSH 100 UNIT/ML IV SOLN
500.0000 [IU] | Freq: Once | INTRAVENOUS | Status: AC
  Administered 2014-11-21: 500 [IU] via INTRAVENOUS
  Filled 2014-11-21: qty 5

## 2014-12-12 ENCOUNTER — Ambulatory Visit: Payer: Medicare Other

## 2014-12-12 ENCOUNTER — Inpatient Hospital Stay: Payer: Medicare Other | Attending: Oncology

## 2014-12-12 DIAGNOSIS — Z801 Family history of malignant neoplasm of trachea, bronchus and lung: Secondary | ICD-10-CM | POA: Diagnosis not present

## 2014-12-12 DIAGNOSIS — Z79899 Other long term (current) drug therapy: Secondary | ICD-10-CM | POA: Insufficient documentation

## 2014-12-12 DIAGNOSIS — Z9221 Personal history of antineoplastic chemotherapy: Secondary | ICD-10-CM | POA: Insufficient documentation

## 2014-12-12 DIAGNOSIS — Z923 Personal history of irradiation: Secondary | ICD-10-CM | POA: Insufficient documentation

## 2014-12-12 DIAGNOSIS — G4730 Sleep apnea, unspecified: Secondary | ICD-10-CM | POA: Diagnosis not present

## 2014-12-12 DIAGNOSIS — Z452 Encounter for adjustment and management of vascular access device: Secondary | ICD-10-CM | POA: Diagnosis not present

## 2014-12-12 DIAGNOSIS — C8290 Follicular lymphoma, unspecified, unspecified site: Secondary | ICD-10-CM | POA: Diagnosis present

## 2014-12-12 DIAGNOSIS — K219 Gastro-esophageal reflux disease without esophagitis: Secondary | ICD-10-CM | POA: Diagnosis not present

## 2014-12-12 DIAGNOSIS — Z7982 Long term (current) use of aspirin: Secondary | ICD-10-CM | POA: Insufficient documentation

## 2014-12-12 LAB — CBC WITH DIFFERENTIAL/PLATELET
Basophils Absolute: 0.1 10*3/uL (ref 0–0.1)
Basophils Relative: 1 %
Eosinophils Absolute: 0.1 10*3/uL (ref 0–0.7)
Eosinophils Relative: 2 %
HCT: 43.8 % (ref 40.0–52.0)
Hemoglobin: 14.7 g/dL (ref 13.0–18.0)
LYMPHS ABS: 1.4 10*3/uL (ref 1.0–3.6)
LYMPHS PCT: 24 %
MCH: 30.4 pg (ref 26.0–34.0)
MCHC: 33.6 g/dL (ref 32.0–36.0)
MCV: 90.5 fL (ref 80.0–100.0)
Monocytes Absolute: 0.6 10*3/uL (ref 0.2–1.0)
Monocytes Relative: 9 %
Neutro Abs: 3.8 10*3/uL (ref 1.4–6.5)
Neutrophils Relative %: 64 %
PLATELETS: 158 10*3/uL (ref 150–440)
RBC: 4.84 MIL/uL (ref 4.40–5.90)
RDW: 13.1 % (ref 11.5–14.5)
WBC: 6 10*3/uL (ref 3.8–10.6)

## 2014-12-12 LAB — COMPREHENSIVE METABOLIC PANEL
ALT: 21 U/L (ref 17–63)
AST: 25 U/L (ref 15–41)
Albumin: 4.4 g/dL (ref 3.5–5.0)
Alkaline Phosphatase: 65 U/L (ref 38–126)
Anion gap: 11 (ref 5–15)
BILIRUBIN TOTAL: 0.8 mg/dL (ref 0.3–1.2)
BUN: 14 mg/dL (ref 6–20)
CO2: 25 mmol/L (ref 22–32)
CREATININE: 1 mg/dL (ref 0.61–1.24)
Calcium: 9.1 mg/dL (ref 8.9–10.3)
Chloride: 103 mmol/L (ref 101–111)
Glucose, Bld: 102 mg/dL — ABNORMAL HIGH (ref 65–99)
Potassium: 3.9 mmol/L (ref 3.5–5.1)
Sodium: 139 mmol/L (ref 135–145)
Total Protein: 6.6 g/dL (ref 6.5–8.1)

## 2014-12-12 LAB — LACTATE DEHYDROGENASE: LDH: 139 U/L (ref 98–192)

## 2014-12-13 ENCOUNTER — Ambulatory Visit: Payer: Medicare Other

## 2014-12-13 ENCOUNTER — Other Ambulatory Visit: Payer: Medicare Other

## 2014-12-13 ENCOUNTER — Ambulatory Visit
Admission: RE | Admit: 2014-12-13 | Discharge: 2014-12-13 | Disposition: A | Discharge status: Home / Self Care | Payer: Medicare Other | Source: Ambulatory Visit | Attending: Oncology | Admitting: Oncology

## 2014-12-13 DIAGNOSIS — R911 Solitary pulmonary nodule: Secondary | ICD-10-CM | POA: Diagnosis not present

## 2014-12-13 DIAGNOSIS — K8020 Calculus of gallbladder without cholecystitis without obstruction: Secondary | ICD-10-CM | POA: Diagnosis not present

## 2014-12-13 DIAGNOSIS — C8290 Follicular lymphoma, unspecified, unspecified site: Secondary | ICD-10-CM | POA: Insufficient documentation

## 2014-12-13 HISTORY — DX: Follicular lymphoma, unspecified, unspecified site: C82.90

## 2014-12-13 MED ORDER — IOHEXOL 350 MG/ML SOLN
125.0000 mL | Freq: Once | INTRAVENOUS | Status: AC | PRN
  Administered 2014-12-13: 125 mL via INTRAVENOUS

## 2014-12-14 ENCOUNTER — Inpatient Hospital Stay (HOSPITAL_BASED_OUTPATIENT_CLINIC_OR_DEPARTMENT_OTHER): Payer: Medicare Other | Admitting: Oncology

## 2014-12-14 VITALS — BP 120/71 | HR 72 | Temp 96.7°F | Resp 20 | Wt 209.0 lb

## 2014-12-14 DIAGNOSIS — K219 Gastro-esophageal reflux disease without esophagitis: Secondary | ICD-10-CM | POA: Diagnosis not present

## 2014-12-14 DIAGNOSIS — Z801 Family history of malignant neoplasm of trachea, bronchus and lung: Secondary | ICD-10-CM

## 2014-12-14 DIAGNOSIS — Z7982 Long term (current) use of aspirin: Secondary | ICD-10-CM

## 2014-12-14 DIAGNOSIS — G4730 Sleep apnea, unspecified: Secondary | ICD-10-CM

## 2014-12-14 DIAGNOSIS — Z9221 Personal history of antineoplastic chemotherapy: Secondary | ICD-10-CM

## 2014-12-14 DIAGNOSIS — Z923 Personal history of irradiation: Secondary | ICD-10-CM | POA: Diagnosis not present

## 2014-12-14 DIAGNOSIS — Z79899 Other long term (current) drug therapy: Secondary | ICD-10-CM

## 2014-12-14 DIAGNOSIS — C8290 Follicular lymphoma, unspecified, unspecified site: Secondary | ICD-10-CM

## 2014-12-14 NOTE — Progress Notes (Signed)
[facility]  [phone] [phone]  ID: Gregroy Dombkowski OB: July 21, 1945  MR#: [id number]  AQT#:[id number]  Patient Care Team: Idelle Crouch, MD as PCP - General (Internal Medicine)  CHIEF COMPLAINT: Stage IV, grade 2, follicular lymphoma.  INTERVAL HISTORY: Patient returns to clinic today for repeat laboratory work and further evaluation.   He currently feels well and is asymptomatic. He has no visual changes.  He has no neurologic complaints.  He denies any fevers, weight loss, or night sweats.  He has noted no new lymphadenopathy.  He denies any chest pain or shortness of breath.  He denies any nausea, vomiting, constipation, or diarrhea.  He has no urinary complaints.  Patient offers no specific complaints today.  REVIEW OF SYSTEMS:   Review of Systems  Constitutional: Negative for fever, weight loss and diaphoresis.  Eyes: Negative.   Neurological: Negative.  Negative for weakness.    As per HPI. Otherwise, a complete review of systems is negatve.  PAST MEDICAL HISTORY: Past Medical History  Diagnosis Date  . Sleep apnea   . GERD (gastroesophageal reflux disease)   . Hemorrhoids   . Follicular lymphoma 5625    Patient had chemo + rad tx's and zevaline shots.    PAST SURGICAL HISTORY: Hemorrhoidectomy.  FAMILY HISTORY: Colon cancer and lung cancer.     ADVANCED DIRECTIVES:    HEALTH MAINTENANCE: Social History  Substance Use Topics  . Smoking status: Never Smoker   . Smokeless tobacco: Never Used  . Alcohol Use: Yes     Comment: occasional     Colonoscopy:  PAP:  Bone density:  Lipid panel:  No Known Allergies  Current Outpatient Prescriptions  Medication Sig Dispense Refill  . amLODipine (NORVASC) 5 MG tablet     . aspirin EC 81 MG tablet Take by mouth.    Marland Kitchen atorvastatin (LIPITOR) 10 MG tablet     . Calcium Carbonate-Vitamin D 600-400 MG-UNIT per tablet Take by mouth.    . cetirizine (ZYRTEC) 10 MG tablet  Take 10 mg by mouth daily.    . Multiple Vitamin (MULTI-VITAMINS) TABS Take by mouth.    Marland Kitchen omeprazole (PRILOSEC) 40 MG capsule     . sildenafil (VIAGRA) 100 MG tablet Take by mouth.    . oxybutynin (DITROPAN) 5 MG tablet     . prednisoLONE acetate (PRED FORTE) 1 % ophthalmic suspension      No current facility-administered medications for this visit.    OBJECTIVE: Filed Vitals:   12/14/14 1033  BP: 120/71  Pulse: 72  Temp: 96.7 F (35.9 C)  Resp: 20     There is no height on file to calculate BMI.    ECOG FS:0 - Asymptomatic  General: Well-developed, well-nourished, no acute distress. Eyes: anicteric sclera. Neck: No palpable lymphadenopathy. Lungs: Clear to auscultation bilaterally. Heart: Regular rate and rhythm. No rubs, murmurs, or gallops. Abdomen: Soft, nontender, nondistended. No organomegaly noted, normoactive bowel sounds. Musculoskeletal: No edema, cyanosis, or clubbing. Neuro: Alert, answering all questions appropriately. Cranial nerves grossly intact. Skin: Subcentimeter lesion on left neck. Psych: Normal affect. Lymphatics: No cervical, calvicular, axillary or inguinal LAD.   LAB RESULTS:  Lab Results  Component Value Date   NA 139 12/12/2014   K 3.9 12/12/2014   CL 103 12/12/2014   CO2 25 12/12/2014   GLUCOSE 102* 12/12/2014   BUN 14 12/12/2014   CREATININE 1.00 12/12/2014   CALCIUM 9.1 12/12/2014   PROT 6.6 12/12/2014   ALBUMIN 4.4  12/12/2014   AST 25 12/12/2014   ALT 21 12/12/2014   ALKPHOS 65 12/12/2014   BILITOT 0.8 12/12/2014   GFRNONAA >60 12/12/2014   GFRAA >60 12/12/2014    Lab Results  Component Value Date   WBC 6.0 12/12/2014   NEUTROABS 3.8 12/12/2014   HGB 14.7 12/12/2014   HCT 43.8 12/12/2014   MCV 90.5 12/12/2014   PLT 158 12/12/2014     STUDIES: Ct Head W Wo Contrast  12/13/2014   CLINICAL DATA:  69 year old male with recurrent follicular lymphoma. Restaging. Subsequent encounter.  EXAM: CT HEAD WITHOUT AND WITH CONTRAST   TECHNIQUE: Contiguous axial images were obtained from the base of the skull through the vertex without and with intravenous contrast  CONTRAST:  151mL OMNIPAQUE IOHEXOL 350 MG/ML SOLN  COMPARISON:  Head CT 06/06/2014 and earlier. Face CT from today reported separately.  FINDINGS: Visualized paranasal sinuses and mastoids are clear. Stable visualized osseous structures. Visualized scalp soft tissues are within normal limits.  Mild Calcified atherosclerosis at the skull base. Cerebral volume is stable and normal for age. No ventriculomegaly. No midline shift, mass effect, or evidence of intracranial mass lesion. No acute intracranial hemorrhage identified. No cortically based acute infarct identified. Major intracranial vascular structures are enhancing. No abnormal enhancement identified.  IMPRESSION: Stable and normal for age CT appearance of the brain.   Electronically Signed   By: Genevie Ann M.D.   On: 12/13/2014 14:47   Ct Chest W Contrast  12/13/2014   CLINICAL DATA:  Restaging of follicular lymphoma of right optic nerve. History of lymph node resection left-sided neck. Chemotherapy and radiation therapy.  EXAM: CT CHEST, ABDOMEN, AND PELVIS WITH CONTRAST  TECHNIQUE: Multidetector CT imaging of the chest, abdomen and pelvis was performed following the standard protocol during bolus administration of intravenous contrast.  CONTRAST:  150mL OMNIPAQUE IOHEXOL 350 MG/ML SOLN  COMPARISON:  Face and neck CT, dictated separately. Prior exams of 06/06/2014.  FINDINGS: CT CHEST FINDINGS  Mediastinum/Nodes: A low left jugular node measures 6 mm on image 5 and is enlarged since the prior.  Right axillary node measures 8 mm on image 15 versus 7 mm on the prior.  Multiple small left axillary nodes. Maximally 1.0 cm today versus 8 mm on the prior.  Normal heart size, without pericardial effusion. No central pulmonary embolism, on this non-dedicated study. 7 mm right paratracheal node on image 8 measured 4 mm on the prior. No  hilar adenopathy.  Lungs/Pleura: No pleural fluid. Azygos fissure. Bibasilar dependent atelectasis, greater on the left.  3 mm left apical pulmonary nodule on image 12 of series 6. This is unchanged.  Musculoskeletal: No acute osseous abnormality.  CT ABDOMEN AND PELVIS FINDINGS  Hepatobiliary: Normal liver. Gallstone without acute inflammation or biliary duct dilatation.  Pancreas: Normal, without mass or ductal dilatation.  Spleen: A too small to characterize posterior splenic lesion on image 40 measures approximately 5 mm. Not readily apparent on the prior exam, but of doubtful clinical significance.  Adrenals/Urinary Tract: Right adrenal nodule is similar, 1.8 cm. A left adrenal nodule is not significantly changed at 2.5 cm.  Too small to characterize lesions in both kidneys. No hydronephrosis. Normal ureters and urinary bladder.  Stomach/Bowel: Normal stomach, without wall thickening. Scattered colonic diverticula. Normal terminal ileum and appendix. Normal small bowel caliber. Redemonstration of increased density within the small bowel mesentery. Example image 73 of series 5.  Vascular/Lymphatic: Aortic and branch vessel atherosclerosis. No abdominopelvic adenopathy.  Reproductive: Mild prostatomegaly with  impression of the median lobe into the urinary bladder, eccentric right. Similar.  Other: No significant free fluid. Tiny fat containing ventral abdominal wall hernia. A nodule just deep to the xiphoid process of the sternum measures 1.3 cm on image 49 and is unchanged.  Musculoskeletal: Mild osteopenia. Degenerative partial fusion of the bilateral sacroiliac joints. Mild convex left lumbar spine curvature. Degenerative disc disease and a disc bulge at the L4-5 level.  IMPRESSION: 1. Minimal enlargement of nonpathologic sized upper thoracic and lower cervical nodes. Given suspicious findings on today's neck CT, cannot exclude active lymphoma 2. Similar size of a nonspecific nodule deep to the xiphoid process  of the sternum. 3. Similar bilateral adrenal nodules. 4. No evidence of adenopathy within the abdomen or pelvis. 5. similar appearance of small bowel mesenteric fat increased density. This could relate to treated lymphoma or mesenteric panniculitis. 6. Similar 3 mm left apical pulmonary nodule. 7. Cholelithiasis.   Electronically Signed   By: Abigail Miyamoto M.D.   On: 12/13/2014 16:38   Ct Abdomen Pelvis W Contrast  12/13/2014   CLINICAL DATA:  Restaging of follicular lymphoma of right optic nerve. History of lymph node resection left-sided neck. Chemotherapy and radiation therapy.  EXAM: CT CHEST, ABDOMEN, AND PELVIS WITH CONTRAST  TECHNIQUE: Multidetector CT imaging of the chest, abdomen and pelvis was performed following the standard protocol during bolus administration of intravenous contrast.  CONTRAST:  173mL OMNIPAQUE IOHEXOL 350 MG/ML SOLN  COMPARISON:  Face and neck CT, dictated separately. Prior exams of 06/06/2014.  FINDINGS: CT CHEST FINDINGS  Mediastinum/Nodes: A low left jugular node measures 6 mm on image 5 and is enlarged since the prior.  Right axillary node measures 8 mm on image 15 versus 7 mm on the prior.  Multiple small left axillary nodes. Maximally 1.0 cm today versus 8 mm on the prior.  Normal heart size, without pericardial effusion. No central pulmonary embolism, on this non-dedicated study. 7 mm right paratracheal node on image 8 measured 4 mm on the prior. No hilar adenopathy.  Lungs/Pleura: No pleural fluid. Azygos fissure. Bibasilar dependent atelectasis, greater on the left.  3 mm left apical pulmonary nodule on image 12 of series 6. This is unchanged.  Musculoskeletal: No acute osseous abnormality.  CT ABDOMEN AND PELVIS FINDINGS  Hepatobiliary: Normal liver. Gallstone without acute inflammation or biliary duct dilatation.  Pancreas: Normal, without mass or ductal dilatation.  Spleen: A too small to characterize posterior splenic lesion on image 40 measures approximately 5 mm. Not  readily apparent on the prior exam, but of doubtful clinical significance.  Adrenals/Urinary Tract: Right adrenal nodule is similar, 1.8 cm. A left adrenal nodule is not significantly changed at 2.5 cm.  Too small to characterize lesions in both kidneys. No hydronephrosis. Normal ureters and urinary bladder.  Stomach/Bowel: Normal stomach, without wall thickening. Scattered colonic diverticula. Normal terminal ileum and appendix. Normal small bowel caliber. Redemonstration of increased density within the small bowel mesentery. Example image 73 of series 5.  Vascular/Lymphatic: Aortic and branch vessel atherosclerosis. No abdominopelvic adenopathy.  Reproductive: Mild prostatomegaly with impression of the median lobe into the urinary bladder, eccentric right. Similar.  Other: No significant free fluid. Tiny fat containing ventral abdominal wall hernia. A nodule just deep to the xiphoid process of the sternum measures 1.3 cm on image 49 and is unchanged.  Musculoskeletal: Mild osteopenia. Degenerative partial fusion of the bilateral sacroiliac joints. Mild convex left lumbar spine curvature. Degenerative disc disease and a disc bulge at the  L4-5 level.  IMPRESSION: 1. Minimal enlargement of nonpathologic sized upper thoracic and lower cervical nodes. Given suspicious findings on today's neck CT, cannot exclude active lymphoma 2. Similar size of a nonspecific nodule deep to the xiphoid process of the sternum. 3. Similar bilateral adrenal nodules. 4. No evidence of adenopathy within the abdomen or pelvis. 5. similar appearance of small bowel mesenteric fat increased density. This could relate to treated lymphoma or mesenteric panniculitis. 6. Similar 3 mm left apical pulmonary nodule. 7. Cholelithiasis.   Electronically Signed   By: Abigail Miyamoto M.D.   On: 12/13/2014 16:38   Ct Maxillofacial W/cm  12/13/2014   CLINICAL DATA:  69 year old male restaging recurrent follicular lymphoma. Site of recurrence the optic nerve.  Subsequent encounter.  EXAM: CT MAXILLOFACIAL WITH CONTRAST  TECHNIQUE: Multidetector CT imaging of the maxillofacial structures was performed with intravenous contrast. Multiplanar CT image reconstructions were also generated. A small metallic BB was placed on the right temple in order to reliably differentiate right from left.  CONTRAST:  147mL OMNIPAQUE IOHEXOL 350 MG/ML SOLN  COMPARISON:  Head and Neck CT 06/06/2014, and earlier  FINDINGS: Mandible intact. Degenerative changes in the visualized cervical spine. Visualized calvarium intact. No acute or suspicious osseous lesion identified in the face or at the skullbase.  Visualized major vascular structures in the neck and at the skullbase are patent. Negative visualized brain parenchyma.  Cataract surgery changes to the right globe. No abnormal intraorbital enhancement, or intraorbital inflammation. Normal extraocular muscles. No intraorbital mass. Symmetric and normal CT appearance of the optic nerves.  The cavernous sinus appears within normal limits.  Enlarged right level IIIb lymph node seen on series 3, image 73, now measuring 7-8 mm short axis (previously 5-6 mm). Partially visible right level 4 node measuring 5-6 mm short axis now was 3-4 mm in March. Bilateral level 2 lymph nodes appear stable and within normal limits. Negative parent she will, retropharyngeal, and sublingual spaces. Negative visualized submandibular glands and parotid visualized pharyngeal and laryngeal soft tissue contours are within normal limits. Partially visible glands superior thyroid poles appear normal.  IMPRESSION: 1. Mildly increased right level 3 and level 4 lymph nodes since March suspicious for active lymphoma. 2. No other suspicious finding identified in the face. No orbital mass. The cavernous sinus appears within normal limits.   Electronically Signed   By: Genevie Ann M.D.   On: 12/13/2014 14:44    ASSESSMENT: Recurrent, stage IV grade 2 follicular lymphoma.  PLAN:     1.  Follicular lymphoma: Patient completed his rituximab and Zevalin therapy in June 2014.  Patient had recurrence in his right orbit. CT scan results reviewed independently and reported as above with minimal increase in several lymph nodes.  Although lymph nodes are nonpathologic, given his history will monitor closely. Return to clinic in 6 months with repeat laboratory work imaging, and further evaluation.     Patient expressed understanding and was in agreement with this plan. He also understands that He can call clinic at any time with any questions, concerns, or complaints.    Lloyd Huger, MD   12/14/2014 11:27 AM

## 2015-01-02 ENCOUNTER — Inpatient Hospital Stay: Payer: Medicare Other

## 2015-01-02 ENCOUNTER — Other Ambulatory Visit: Payer: Medicare Other

## 2015-01-02 DIAGNOSIS — C8290 Follicular lymphoma, unspecified, unspecified site: Secondary | ICD-10-CM | POA: Diagnosis not present

## 2015-01-02 DIAGNOSIS — Z95828 Presence of other vascular implants and grafts: Secondary | ICD-10-CM

## 2015-01-02 MED ORDER — HEPARIN SOD (PORK) LOCK FLUSH 100 UNIT/ML IV SOLN
500.0000 [IU] | Freq: Once | INTRAVENOUS | Status: AC
  Administered 2015-01-02: 500 [IU] via INTRAVENOUS

## 2015-01-02 MED ORDER — HEPARIN SOD (PORK) LOCK FLUSH 100 UNIT/ML IV SOLN
INTRAVENOUS | Status: AC
  Filled 2015-01-02: qty 5

## 2015-01-02 MED ORDER — SODIUM CHLORIDE 0.9 % IJ SOLN
10.0000 mL | INTRAMUSCULAR | Status: DC | PRN
  Administered 2015-01-02: 10 mL via INTRAVENOUS
  Filled 2015-01-02: qty 10

## 2015-02-13 ENCOUNTER — Inpatient Hospital Stay: Payer: Medicare Other | Attending: Oncology

## 2015-02-13 VITALS — BP 121/73 | HR 74 | Temp 97.5°F | Resp 20

## 2015-02-13 DIAGNOSIS — Z452 Encounter for adjustment and management of vascular access device: Secondary | ICD-10-CM | POA: Insufficient documentation

## 2015-02-13 DIAGNOSIS — Z9221 Personal history of antineoplastic chemotherapy: Secondary | ICD-10-CM | POA: Insufficient documentation

## 2015-02-13 DIAGNOSIS — Z923 Personal history of irradiation: Secondary | ICD-10-CM | POA: Diagnosis not present

## 2015-02-13 DIAGNOSIS — C8290 Follicular lymphoma, unspecified, unspecified site: Secondary | ICD-10-CM | POA: Diagnosis not present

## 2015-02-13 DIAGNOSIS — Z95828 Presence of other vascular implants and grafts: Secondary | ICD-10-CM

## 2015-02-13 MED ORDER — SODIUM CHLORIDE 0.9 % IJ SOLN
10.0000 mL | INTRAMUSCULAR | Status: DC | PRN
  Administered 2015-02-13: 10 mL via INTRAVENOUS
  Filled 2015-02-13: qty 10

## 2015-02-13 MED ORDER — HEPARIN SOD (PORK) LOCK FLUSH 100 UNIT/ML IV SOLN
500.0000 [IU] | Freq: Once | INTRAVENOUS | Status: AC
  Administered 2015-02-13: 500 [IU] via INTRAVENOUS
  Filled 2015-02-13: qty 5

## 2015-02-21 ENCOUNTER — Ambulatory Visit
Admission: RE | Admit: 2015-02-21 | Discharge: 2015-02-21 | Disposition: A | Discharge status: Home / Self Care | Payer: Medicare Other | Source: Ambulatory Visit | Attending: Radiation Oncology | Admitting: Radiation Oncology

## 2015-02-21 ENCOUNTER — Encounter: Payer: Self-pay | Admitting: Radiation Oncology

## 2015-02-21 VITALS — BP 125/80 | HR 80 | Temp 97.4°F | Resp 18 | Ht 70.5 in | Wt 210.0 lb

## 2015-02-21 DIAGNOSIS — C8211 Follicular lymphoma grade II, lymph nodes of head, face, and neck: Secondary | ICD-10-CM

## 2015-02-21 NOTE — Progress Notes (Signed)
Radiation Oncology Follow up Note  Name: Sean Raymond   Date:   02/21/2015 MRN:  [id number] DOB: 1946/03/28    This 69 y.o. 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  REFERRING PROVIDER: No ref. 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 He develop trusion the right orbit underwent radiation therapy to that site with excellent response.. Patient also received Zevalin now out 2-1/2 years. Patient is doing well recent CT scan shows only some minimal involved nodes in his cervical chain no other evidence of progressive disease at this point. He specifically denies fever chills night sweats. He still has diplopia in his right eye has been seen by mouth ophthalmology multiple times with no significant correction.  COMPLICATIONS OF TREATMENT: none  FOLLOW UP COMPLIANCE: keeps appointments   PHYSICAL EXAM:  BP 125/80 mmHg  Pulse 80  Temp(Src) 97.4 F (36.3 C)  Resp 18  Ht 5' 10.5" (1.791 m)  Wt 209 lb 15.8 oz (95.25 kg)  BMI 29.69 kg/m2 No evidence of peripheral adenopathy is noted no cervical supra clavicular axillary or inguinal adenopathy is detected. Crude visual fields are within normal range. Well-developed well-nourished patient in NAD. HEENT reveals PERLA, EOMI, discs not visualized.  Oral cavity is clear. No oral mucosal lesions are identified. Neck is clear without evidence of cervical or supraclavicular adenopathy. Lungs are clear to A&P. Cardiac examination is essentially unremarkable with regular rate and rhythm without murmur rub or thrill. Abdomen is benign with no organomegaly or masses noted. Motor sensory and DTR levels are equal and symmetric in the upper and lower extremities. Cranial nerves II through XII are grossly intact. Proprioception is intact. No peripheral adenopathy or edema is  identified. No motor or sensory levels are noted. Crude visual fields are within normal range.  RADIOLOGY RESULTS: CT scans of head chest abdomen and pelvis are all reviewed compatible with the above-stated findings.  PLAN: Present time he continues under controlled his follicular lymphoma. He continues close follow-up care with medical oncology. I have asked to see him back in 1 year for follow-up. He continues workup with ophthalmology. Patient knows to call with any concerns.  I would like to take this opportunity for allowing me to participate in the care of your patient.Armstead Peaks., MD

## 2015-03-20 ENCOUNTER — Inpatient Hospital Stay: Payer: Medicare Other | Attending: Oncology

## 2015-03-20 VITALS — BP 132/82 | HR 69 | Temp 97.2°F

## 2015-03-20 DIAGNOSIS — Z923 Personal history of irradiation: Secondary | ICD-10-CM | POA: Diagnosis not present

## 2015-03-20 DIAGNOSIS — C8290 Follicular lymphoma, unspecified, unspecified site: Secondary | ICD-10-CM | POA: Insufficient documentation

## 2015-03-20 DIAGNOSIS — Z452 Encounter for adjustment and management of vascular access device: Secondary | ICD-10-CM | POA: Insufficient documentation

## 2015-03-20 DIAGNOSIS — Z9221 Personal history of antineoplastic chemotherapy: Secondary | ICD-10-CM | POA: Insufficient documentation

## 2015-03-20 DIAGNOSIS — Z95828 Presence of other vascular implants and grafts: Secondary | ICD-10-CM

## 2015-03-20 MED ORDER — SODIUM CHLORIDE 0.9 % IJ SOLN
10.0000 mL | INTRAMUSCULAR | Status: DC | PRN
  Administered 2015-03-20: 10 mL via INTRAVENOUS
  Filled 2015-03-20: qty 10

## 2015-03-20 MED ORDER — HEPARIN SOD (PORK) LOCK FLUSH 100 UNIT/ML IV SOLN
500.0000 [IU] | Freq: Once | INTRAVENOUS | Status: AC
  Administered 2015-03-20: 500 [IU] via INTRAVENOUS
  Filled 2015-03-20: qty 5

## 2015-03-27 ENCOUNTER — Inpatient Hospital Stay: Payer: Medicare Other

## 2015-04-20 ENCOUNTER — Emergency Department
Admission: EM | Admit: 2015-04-20 | Discharge: 2015-04-20 | Disposition: A | Discharge status: Home / Self Care | Payer: Medicare Other | Attending: Emergency Medicine | Admitting: Emergency Medicine

## 2015-04-20 ENCOUNTER — Encounter: Payer: Self-pay | Admitting: Emergency Medicine

## 2015-04-20 ENCOUNTER — Emergency Department: Payer: Medicare Other

## 2015-04-20 DIAGNOSIS — Z79899 Other long term (current) drug therapy: Secondary | ICD-10-CM | POA: Insufficient documentation

## 2015-04-20 DIAGNOSIS — Z7982 Long term (current) use of aspirin: Secondary | ICD-10-CM | POA: Diagnosis not present

## 2015-04-20 DIAGNOSIS — G454 Transient global amnesia: Secondary | ICD-10-CM

## 2015-04-20 DIAGNOSIS — R4182 Altered mental status, unspecified: Secondary | ICD-10-CM | POA: Diagnosis present

## 2015-04-20 LAB — URINALYSIS COMPLETE WITH MICROSCOPIC (ARMC ONLY)
BILIRUBIN URINE: NEGATIVE
Bacteria, UA: NONE SEEN
GLUCOSE, UA: NEGATIVE mg/dL
Hgb urine dipstick: NEGATIVE
KETONES UR: NEGATIVE mg/dL
LEUKOCYTES UA: NEGATIVE
NITRITE: NEGATIVE
PROTEIN: NEGATIVE mg/dL
Specific Gravity, Urine: 1.008 (ref 1.005–1.030)
Squamous Epithelial / LPF: NONE SEEN
pH: 7 (ref 5.0–8.0)

## 2015-04-20 LAB — CBC WITH DIFFERENTIAL/PLATELET
BASOS ABS: 0.1 10*3/uL (ref 0–0.1)
BASOS PCT: 1 %
EOS ABS: 0.1 10*3/uL (ref 0–0.7)
Eosinophils Relative: 1 %
HCT: 44.8 % (ref 40.0–52.0)
HEMOGLOBIN: 15 g/dL (ref 13.0–18.0)
Lymphocytes Relative: 19 %
Lymphs Abs: 1.2 10*3/uL (ref 1.0–3.6)
MCH: 30.9 pg (ref 26.0–34.0)
MCHC: 33.5 g/dL (ref 32.0–36.0)
MCV: 92 fL (ref 80.0–100.0)
MONOS PCT: 8 %
Monocytes Absolute: 0.5 10*3/uL (ref 0.2–1.0)
NEUTROS PCT: 71 %
Neutro Abs: 4.4 10*3/uL (ref 1.4–6.5)
Platelets: 171 10*3/uL (ref 150–440)
RBC: 4.87 MIL/uL (ref 4.40–5.90)
RDW: 13.1 % (ref 11.5–14.5)
WBC: 6.2 10*3/uL (ref 3.8–10.6)

## 2015-04-20 LAB — BASIC METABOLIC PANEL
ANION GAP: 8 (ref 5–15)
BUN: 11 mg/dL (ref 6–20)
CALCIUM: 9.1 mg/dL (ref 8.9–10.3)
CO2: 27 mmol/L (ref 22–32)
CREATININE: 0.94 mg/dL (ref 0.61–1.24)
Chloride: 105 mmol/L (ref 101–111)
Glucose, Bld: 91 mg/dL (ref 65–99)
Potassium: 3.9 mmol/L (ref 3.5–5.1)
SODIUM: 140 mmol/L (ref 135–145)

## 2015-04-20 LAB — GLUCOSE, CAPILLARY: GLUCOSE-CAPILLARY: 89 mg/dL (ref 65–99)

## 2015-04-20 NOTE — ED Provider Notes (Signed)
[facility] Emergency Department Provider Note  ____________________________________________  Time seen: 8:40 PM  I have reviewed the triage vital signs and the nursing notes.   HISTORY  Chief Complaint Altered Mental Status    HPI Sean Raymond is a 70 y.o. male is brought to the ED by his son due to 2 episodes of forgetfulness today. He's never had anything like this before. Today around 3 PM he had about a 5 minute episode of confusion where he was asking his wife what she was washing in the laundry machine, when in fact he had placed a set of sheets in their and started it himself just prior.A little while later, he was sitting on the couch after his wife had been given her medicine that he helped with, and he brought up on his own that they needed to give her her medicine that she had just taken. He has no recollection of the events as described by his son. He feels like he is back to normal now in the department needs to 5 minute episodes, he's been entirely normal. Denies any numbness tingling weakness vision changes or headache. No syncope or head injuries.  The family's been under a lot of stress recently because his wife had back surgery that was compensated by a C. difficile infection. He's been subacutely sleep deprived, not been eating and drinking regularly, and has recently had a viral upper respiratory illness. He was taking cough medicine up until 2 days ago     Past Medical History  Diagnosis Date  . Sleep apnea   . GERD (gastroesophageal reflux disease)   . Hemorrhoids   . 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     There are no active problems to display for this patient.    History reviewed. No pertinent past surgical history.   Current Outpatient Rx  Name  Route  Sig  Dispense  Refill  . amLODipine (NORVASC) 5 MG tablet               . aspirin EC 81 MG tablet   Oral   Take  by mouth.         Marland Kitchen atorvastatin (LIPITOR) 10 MG tablet               . Calcium Carbonate-Vitamin D 600-400 MG-UNIT per tablet   Oral   Take by mouth.         . cetirizine (ZYRTEC) 10 MG tablet   Oral   Take 10 mg by mouth daily.         . fesoterodine (TOVIAZ) 8 MG TB24 tablet   Oral   Take by mouth.         . Multiple Vitamin (MULTI-VITAMINS) TABS   Oral   Take by mouth.         Marland Kitchen omeprazole (PRILOSEC) 40 MG capsule               . oxybutynin (DITROPAN) 5 MG tablet               . prednisoLONE acetate (PRED FORTE) 1 % ophthalmic suspension               . saw palmetto 500 MG capsule   Oral   Take 500 mg by mouth daily.         . sildenafil (VIAGRA) 100 MG tablet   Oral   Take by mouth.  Allergies Review of patient's allergies indicates no known allergies.   History reviewed. No pertinent family history.  Social History Social History  Substance Use Topics  . Smoking status: Never Smoker   . Smokeless tobacco: Never Used  . Alcohol Use: Yes     Comment: occasional    Review of Systems  Constitutional:   No fever or chills. No weight changes Eyes:   No blurry vision or double vision.  ENT:   No sore throat. Cardiovascular:   No chest pain. Respiratory:   No dyspnea or cough. Gastrointestinal:   Negative for abdominal pain, vomiting and diarrhea.  No BRBPR or melena. Genitourinary:   Negative for dysuria, urinary retention, bloody urine, or difficulty urinating. Musculoskeletal:   Negative for back pain. No joint swelling or pain. Skin:   Negative for rash. Neurological:   Negative for headaches, focal weakness or numbness. Psychiatric:  No anxiety or depression.   Endocrine:  No hot/cold intolerance, changes in energy, or sleep difficulty.  10-point ROS otherwise negative.  ____________________________________________   PHYSICAL EXAM:  VITAL SIGNS: ED Triage Vitals  Enc Vitals Group     BP 04/20/15  1844 134/77 mmHg     Pulse Rate 04/20/15 1844 93     Resp 04/20/15 2000 18     Temp 04/20/15 1844 97.7 F (36.5 C)     Temp src --      SpO2 04/20/15 1844 94 %     Weight 04/20/15 1844 199 lb (90.266 kg)     Height 04/20/15 1844 5\' 11"  (1.803 m)     Head Cir --      Peak Flow --      Pain Score --      Pain Loc --      Pain Edu? --      Excl. in [place]? --     Vital signs reviewed, nursing assessments reviewed.   Constitutional:   Alert and oriented. Well appearing and in no distress. Eyes:   No scleral icterus. No conjunctival pallor. PERRL. EOMI ENT   Head:   Normocephalic and atraumatic.   Nose:   No congestion/rhinnorhea. No septal hematoma   Mouth/Throat:   MMM, no pharyngeal erythema. No peritonsillar mass. No uvula shift.   Neck:   No stridor. No SubQ emphysema. No meningismus. Hematological/Lymphatic/Immunilogical:   No cervical lymphadenopathy. Cardiovascular:   RRR. Normal and symmetric distal pulses are present in all extremities. No murmurs, rubs, or gallops. Respiratory:   Normal respiratory effort without tachypnea nor retractions. Breath sounds are clear and equal bilaterally. No wheezes/rales/rhonchi. Gastrointestinal:   Soft and nontender. No distention. There is no CVA tenderness.  No rebound, rigidity, or guarding. Genitourinary:   deferred Musculoskeletal:   Nontender with normal range of motion in all extremities. No joint effusions.  No lower extremity tenderness.  No edema. Neurologic:   Normal speech and language.  CN 2-10 normal. Motor grossly intact. No pronator drift.  Normal gait. Normal finger-nose-finger Intact serial sevens, spelling "world" forward and backwards, 3 word recall No gross focal neurologic deficits are appreciated.  Skin:    Skin is warm, dry and intact. No rash noted.  No petechiae, purpura, or bullae. Psychiatric:   Mood and affect are normal. Speech and behavior are normal. Patient exhibits appropriate insight and  judgment.  ____________________________________________    LABS (pertinent positives/negatives) (all labs ordered are listed, but only abnormal results are displayed) Labs Reviewed  URINALYSIS COMPLETEWITH MICROSCOPIC ([place]) - Abnormal; Notable for the  following:    Color, Urine YELLOW (*)    APPearance CLEAR (*)    All other components within normal limits  GLUCOSE, CAPILLARY  BASIC METABOLIC PANEL  CBC WITH DIFFERENTIAL/PLATELET   ____________________________________________   EKG  Interpreted by me  Date: 04/20/2015  Rate: 97  Rhythm: normal sinus rhythm  QRS Axis: normal  Intervals: normal  ST/T Wave abnormalities: normal  Conduction Disutrbances: none  Narrative Interpretation: unremarkable      ____________________________________________    RADIOLOGY  CT head unremarkable  ____________________________________________   PROCEDURES   ____________________________________________   INITIAL IMPRESSION / ASSESSMENT AND PLAN / ED COURSE  Pertinent labs & imaging results that were available during my care of the patient were reviewed by me and considered in my medical decision making (see chart for details).  Patient presents with 2 episodes of forgetfulness but is now resolved. Currently at his baseline. Vital signs are normal. He has no complaints at this time. This appears to represent transient global amnesia. There are multiple possible causes for this including stress sleep deprivation and recent illness and taking cough medicines with some potential anticholinergic effect. This could also represent TIA, but I think this is unlikely. There is no evidence of stroke or intracranial hemorrhage on exam right now, low suspicion for encephalitis or meningitis. He has follow-up with his primary care doctor in 4 days, and also advised him to follow up with neurology as well for further evaluation. He'll continue taking his daily aspirin. His medical problems  are well managed and he is compliant with medical therapy and low risk for stroke at this time. His certainly not a TPA candidate given the fact that the symptoms are overall very minor and completely resolved..     ____________________________________________   FINAL CLINICAL IMPRESSION(S) / ED DIAGNOSES  Final diagnoses:  Transient global amnesia      Carrie Mew, MD 04/20/15 2106

## 2015-04-20 NOTE — ED Notes (Signed)
Pt was normal until 1500, then started forgetting what he was doing, which is unusual for him.

## 2015-04-20 NOTE — Discharge Instructions (Signed)
If you have worsened symptoms including increased frequency of attacks or increased duration, new weakness anywhere in the body, severe headache, changes in speech, vomiting, decreased alertness, or any other concerns, return to the emergency department for repeat evaluation. Otherwise follow-up with her primary care doctor and neurologist.  Transient Global Amnesia Transient global amnesia causes a sudden and temporary (transient) loss of memory (amnesia). While you may recall memories from your distant past, including being able to recognize people you know well, you may not recall things that happened more recently in the past days, months, or even year. A transient global amnesia episode does not last longer than 24 hours.  Transient global amnesia does not affect your other brain functions. Your memory usually returns to normal after an episode is over. One episode of transient global amnesia does not make you more likely to have a stroke, a relapse, or other complications.  CAUSES  The cause of this condition is not known.  RISK FACTORS  Transient global amnesia is more likely to develop in people who:  Are 45-53 years old.  Have a history of migraine headaches. SYMPTOMS  The main symptoms of this condition include:  The inability to remember recent events.  Asking repetitive questions about the situation and surroundings and not recalling the answers to these questions. Other symptoms include:  Restlessness and nervousness.  Confusion.  Headaches.  Dizziness.  Nausea. DIAGNOSIS  Your health care provider may suspect transient global amnesia based on your symptoms. Your health care provider will do a physical exam. This may include a test to check your mental abilities (cognitive evaluation). You may also have imaging studies done to check your brain function. These may include:   Electroencephalography (EEG).  Diffusion-weighted imaging (DWI).  MRI. TREATMENT  There is  no treatment for this condition. An episode typically goes away on its own after a few hours. If you also have a seizure or migraine during an episode, you will receive treatment for these conditions. This may include medicines. HOME CARE INSTRUCTIONS  Take medicines only as directed by your health care provider.  Tell your family or friends that you have transient global amnesia. Ask them to help you avoid physical exertion, including sexual intercourse, swimming, and straining while holding your breath (Valsalva maneuver), until the episode passes. These are events that can bring on transient global amnesia attacks. SEEK MEDICAL CARE IF:   You have a migraine and it does not go away after you have followed your treatment plan for this condition.  You have a seizure for the first time, or a seizure that is different from seizures you normally have.  You experience transient global amnesia repeatedly.   This information is not intended to replace advice given to you by your health care provider. Make sure you discuss any questions you have with your health care provider.   Document Released: 04/30/2004 Document Revised: 12/12/2014 Document Reviewed: 12/06/2013 Elsevier Interactive Patient Education [employer].

## 2015-04-25 ENCOUNTER — Other Ambulatory Visit: Payer: Self-pay | Admitting: Internal Medicine

## 2015-04-25 DIAGNOSIS — G454 Transient global amnesia: Secondary | ICD-10-CM

## 2015-04-26 ENCOUNTER — Other Ambulatory Visit: Payer: Self-pay | Admitting: Internal Medicine

## 2015-04-26 DIAGNOSIS — G454 Transient global amnesia: Secondary | ICD-10-CM

## 2015-04-26 DIAGNOSIS — R519 Headache, unspecified: Secondary | ICD-10-CM

## 2015-04-26 DIAGNOSIS — R51 Headache: Secondary | ICD-10-CM

## 2015-04-26 DIAGNOSIS — R4182 Altered mental status, unspecified: Secondary | ICD-10-CM

## 2015-04-26 DIAGNOSIS — R413 Other amnesia: Secondary | ICD-10-CM

## 2015-04-29 ENCOUNTER — Other Ambulatory Visit: Payer: Self-pay | Admitting: Internal Medicine

## 2015-04-29 DIAGNOSIS — G454 Transient global amnesia: Secondary | ICD-10-CM

## 2015-04-29 DIAGNOSIS — R413 Other amnesia: Secondary | ICD-10-CM

## 2015-05-08 ENCOUNTER — Inpatient Hospital Stay: Payer: Medicare Other | Attending: Oncology

## 2015-05-08 VITALS — BP 120/77 | HR 77 | Temp 94.5°F | Resp 20

## 2015-05-08 DIAGNOSIS — Z9221 Personal history of antineoplastic chemotherapy: Secondary | ICD-10-CM | POA: Diagnosis not present

## 2015-05-08 DIAGNOSIS — Z452 Encounter for adjustment and management of vascular access device: Secondary | ICD-10-CM | POA: Diagnosis not present

## 2015-05-08 DIAGNOSIS — Z95828 Presence of other vascular implants and grafts: Secondary | ICD-10-CM

## 2015-05-08 DIAGNOSIS — C8290 Follicular lymphoma, unspecified, unspecified site: Secondary | ICD-10-CM | POA: Diagnosis present

## 2015-05-08 DIAGNOSIS — Z923 Personal history of irradiation: Secondary | ICD-10-CM | POA: Diagnosis not present

## 2015-05-08 MED ORDER — SODIUM CHLORIDE 0.9% FLUSH
10.0000 mL | INTRAVENOUS | Status: DC | PRN
  Administered 2015-05-08: 10 mL via INTRAVENOUS
  Filled 2015-05-08: qty 10

## 2015-05-08 MED ORDER — HEPARIN SOD (PORK) LOCK FLUSH 100 UNIT/ML IV SOLN
500.0000 [IU] | Freq: Once | INTRAVENOUS | Status: AC
  Administered 2015-05-08: 500 [IU] via INTRAVENOUS
  Filled 2015-05-08: qty 5

## 2015-05-15 ENCOUNTER — Ambulatory Visit
Admission: RE | Admit: 2015-05-15 | Discharge: 2015-05-15 | Disposition: A | Discharge status: Home / Self Care | Payer: Medicare Other | Source: Ambulatory Visit | Attending: Internal Medicine | Admitting: Internal Medicine

## 2015-05-15 DIAGNOSIS — G454 Transient global amnesia: Secondary | ICD-10-CM | POA: Diagnosis not present

## 2015-05-15 MED ORDER — GADOBENATE DIMEGLUMINE 529 MG/ML IV SOLN
20.0000 mL | Freq: Once | INTRAVENOUS | Status: AC | PRN
  Administered 2015-05-15: 20 mL via INTRAVENOUS

## 2015-06-18 ENCOUNTER — Encounter: Payer: Self-pay | Admitting: Oncology

## 2015-06-18 ENCOUNTER — Ambulatory Visit
Admission: RE | Admit: 2015-06-18 | Discharge: 2015-06-18 | Disposition: A | Discharge status: Home / Self Care | Payer: Medicare Other | Source: Ambulatory Visit | Attending: Oncology | Admitting: Oncology

## 2015-06-18 ENCOUNTER — Inpatient Hospital Stay: Payer: Medicare Other | Attending: Oncology

## 2015-06-18 ENCOUNTER — Inpatient Hospital Stay: Payer: Medicare Other

## 2015-06-18 DIAGNOSIS — Z9221 Personal history of antineoplastic chemotherapy: Secondary | ICD-10-CM | POA: Insufficient documentation

## 2015-06-18 DIAGNOSIS — C8210 Follicular lymphoma grade II, unspecified site: Secondary | ICD-10-CM | POA: Diagnosis not present

## 2015-06-18 DIAGNOSIS — G4730 Sleep apnea, unspecified: Secondary | ICD-10-CM | POA: Insufficient documentation

## 2015-06-18 DIAGNOSIS — Z923 Personal history of irradiation: Secondary | ICD-10-CM | POA: Insufficient documentation

## 2015-06-18 DIAGNOSIS — C8290 Follicular lymphoma, unspecified, unspecified site: Secondary | ICD-10-CM

## 2015-06-18 DIAGNOSIS — D3501 Benign neoplasm of right adrenal gland: Secondary | ICD-10-CM | POA: Diagnosis not present

## 2015-06-18 DIAGNOSIS — Z801 Family history of malignant neoplasm of trachea, bronchus and lung: Secondary | ICD-10-CM | POA: Diagnosis not present

## 2015-06-18 DIAGNOSIS — Z79899 Other long term (current) drug therapy: Secondary | ICD-10-CM | POA: Diagnosis not present

## 2015-06-18 DIAGNOSIS — Z95828 Presence of other vascular implants and grafts: Secondary | ICD-10-CM

## 2015-06-18 DIAGNOSIS — K760 Fatty (change of) liver, not elsewhere classified: Secondary | ICD-10-CM | POA: Diagnosis not present

## 2015-06-18 DIAGNOSIS — K5730 Diverticulosis of large intestine without perforation or abscess without bleeding: Secondary | ICD-10-CM | POA: Insufficient documentation

## 2015-06-18 DIAGNOSIS — D3502 Benign neoplasm of left adrenal gland: Secondary | ICD-10-CM | POA: Diagnosis not present

## 2015-06-18 DIAGNOSIS — K8020 Calculus of gallbladder without cholecystitis without obstruction: Secondary | ICD-10-CM | POA: Insufficient documentation

## 2015-06-18 DIAGNOSIS — M793 Panniculitis, unspecified: Secondary | ICD-10-CM | POA: Diagnosis not present

## 2015-06-18 DIAGNOSIS — Z7982 Long term (current) use of aspirin: Secondary | ICD-10-CM | POA: Insufficient documentation

## 2015-06-18 DIAGNOSIS — Z800 Family history of malignant neoplasm of digestive organs: Secondary | ICD-10-CM | POA: Insufficient documentation

## 2015-06-18 DIAGNOSIS — K219 Gastro-esophageal reflux disease without esophagitis: Secondary | ICD-10-CM | POA: Diagnosis not present

## 2015-06-18 LAB — CBC WITH DIFFERENTIAL/PLATELET
Basophils Absolute: 0.1 10*3/uL (ref 0–0.1)
Basophils Relative: 1 %
Eosinophils Absolute: 0.1 10*3/uL (ref 0–0.7)
Eosinophils Relative: 2 %
HEMATOCRIT: 41.8 % (ref 40.0–52.0)
HEMOGLOBIN: 14 g/dL (ref 13.0–18.0)
LYMPHS ABS: 1.3 10*3/uL (ref 1.0–3.6)
LYMPHS PCT: 23 %
MCH: 30.6 pg (ref 26.0–34.0)
MCHC: 33.6 g/dL (ref 32.0–36.0)
MCV: 91.1 fL (ref 80.0–100.0)
MONOS PCT: 8 %
Monocytes Absolute: 0.5 10*3/uL (ref 0.2–1.0)
NEUTROS ABS: 3.7 10*3/uL (ref 1.4–6.5)
NEUTROS PCT: 66 %
Platelets: 153 10*3/uL (ref 150–440)
RBC: 4.59 MIL/uL (ref 4.40–5.90)
RDW: 13.1 % (ref 11.5–14.5)
WBC: 5.6 10*3/uL (ref 3.8–10.6)

## 2015-06-18 LAB — COMPREHENSIVE METABOLIC PANEL
ALK PHOS: 61 U/L (ref 38–126)
ALT: 17 U/L (ref 17–63)
ANION GAP: 5 (ref 5–15)
AST: 22 U/L (ref 15–41)
Albumin: 4 g/dL (ref 3.5–5.0)
BILIRUBIN TOTAL: 0.7 mg/dL (ref 0.3–1.2)
BUN: 11 mg/dL (ref 6–20)
CALCIUM: 8.2 mg/dL — AB (ref 8.9–10.3)
CO2: 28 mmol/L (ref 22–32)
CREATININE: 1 mg/dL (ref 0.61–1.24)
Chloride: 104 mmol/L (ref 101–111)
GFR calc non Af Amer: >60 mL/min (ref >60)
Glucose, Bld: 109 mg/dL — ABNORMAL HIGH (ref 65–99)
Potassium: 3.5 mmol/L (ref 3.5–5.1)
Sodium: 137 mmol/L (ref 135–145)
TOTAL PROTEIN: 6 g/dL — AB (ref 6.5–8.1)

## 2015-06-18 LAB — LACTATE DEHYDROGENASE: LDH: 136 U/L (ref 98–192)

## 2015-06-18 MED ORDER — SODIUM CHLORIDE 0.9% FLUSH
10.0000 mL | INTRAVENOUS | Status: DC | PRN
  Administered 2015-06-18: 10 mL via INTRAVENOUS
  Filled 2015-06-18: qty 10

## 2015-06-18 MED ORDER — HEPARIN SOD (PORK) LOCK FLUSH 100 UNIT/ML IV SOLN
500.0000 [IU] | Freq: Once | INTRAVENOUS | Status: AC
  Administered 2015-06-18: 500 [IU] via INTRAVENOUS
  Filled 2015-06-18: qty 5

## 2015-06-18 MED ORDER — IOHEXOL 350 MG/ML SOLN
100.0000 mL | Freq: Once | INTRAVENOUS | Status: AC | PRN
  Administered 2015-06-18: 100 mL via INTRAVENOUS

## 2015-06-19 ENCOUNTER — Inpatient Hospital Stay: Payer: Medicare Other

## 2015-06-22 LAB — COMP PANEL: LEUKEMIA/LYMPHOMA

## 2015-06-28 ENCOUNTER — Inpatient Hospital Stay (HOSPITAL_BASED_OUTPATIENT_CLINIC_OR_DEPARTMENT_OTHER): Payer: Medicare Other | Admitting: Oncology

## 2015-06-28 VITALS — BP 117/76 | HR 79 | Temp 97.0°F | Resp 16 | Wt 210.3 lb

## 2015-06-28 DIAGNOSIS — D3501 Benign neoplasm of right adrenal gland: Secondary | ICD-10-CM

## 2015-06-28 DIAGNOSIS — Z923 Personal history of irradiation: Secondary | ICD-10-CM

## 2015-06-28 DIAGNOSIS — C8210 Follicular lymphoma grade II, unspecified site: Secondary | ICD-10-CM

## 2015-06-28 DIAGNOSIS — Z9221 Personal history of antineoplastic chemotherapy: Secondary | ICD-10-CM | POA: Diagnosis not present

## 2015-06-28 DIAGNOSIS — K219 Gastro-esophageal reflux disease without esophagitis: Secondary | ICD-10-CM | POA: Diagnosis not present

## 2015-06-28 DIAGNOSIS — Z7982 Long term (current) use of aspirin: Secondary | ICD-10-CM

## 2015-06-28 DIAGNOSIS — Z801 Family history of malignant neoplasm of trachea, bronchus and lung: Secondary | ICD-10-CM

## 2015-06-28 DIAGNOSIS — Z79899 Other long term (current) drug therapy: Secondary | ICD-10-CM

## 2015-06-28 DIAGNOSIS — G4730 Sleep apnea, unspecified: Secondary | ICD-10-CM

## 2015-06-28 DIAGNOSIS — C8299 Follicular lymphoma, unspecified, extranodal and solid organ sites: Secondary | ICD-10-CM

## 2015-06-28 DIAGNOSIS — Z800 Family history of malignant neoplasm of digestive organs: Secondary | ICD-10-CM

## 2015-06-28 NOTE — Progress Notes (Signed)
Patient does not offer any problems today.  

## 2015-06-28 NOTE — Progress Notes (Signed)
[place]  [phone] [phone]  ID: Sean Raymond OB: 10-15-1945  MR#: [id number]  [id number]  Patient Care Team: Idelle Crouch, MD as PCP - General (Internal Medicine)  CHIEF COMPLAINT: Stage IV, grade 2, follicular lymphoma.  INTERVAL HISTORY: Patient returns to clinic today for repeat laboratory work, CT results and further evaluation.   He currently feels well and is asymptomatic. He has no visual changes.  He has no neurologic complaints.  He denies any fevers, weight loss, or night sweats.  He has noted no new lymphadenopathy.  He denies any chest pain or shortness of breath.  He denies any nausea, vomiting, constipation, or diarrhea.  He has no urinary complaints.  Patient offers no specific complaints today.  REVIEW OF SYSTEMS:   Review of Systems  Constitutional: Negative for fever, weight loss and diaphoresis.  Eyes: Negative.   Neurological: Negative.  Negative for weakness.    As per HPI. Otherwise, a complete review of systems is negatve.  PAST MEDICAL HISTORY: Past Medical History  Diagnosis Date  . Sleep apnea   . GERD (gastroesophageal reflux disease)   . Hemorrhoids   . Follicular lymphoma ([place]) 2009    Patient had chemo + rad tx's and zevaline shots.    PAST SURGICAL HISTORY: Hemorrhoidectomy.  FAMILY HISTORY: Colon cancer and lung cancer.     ADVANCED DIRECTIVES:    HEALTH MAINTENANCE: Social History  Substance Use Topics  . Smoking status: Never Smoker   . Smokeless tobacco: Never Used  . Alcohol Use: Yes     Comment: occasional     No Known Allergies  Current Outpatient Prescriptions  Medication Sig Dispense Refill  . amLODipine (NORVASC) 5 MG tablet     . aspirin EC 81 MG tablet Take by mouth.    Marland Kitchen atorvastatin (LIPITOR) 10 MG tablet     . Calcium Carbonate-Vitamin D 600-400 MG-UNIT per tablet Take by mouth.    . cetirizine (ZYRTEC) 10 MG tablet Take 10 mg by mouth daily.    .  Multiple Vitamin (MULTI-VITAMINS) TABS Take by mouth.    Marland Kitchen omeprazole (PRILOSEC) 40 MG capsule     . saw palmetto 500 MG capsule Take 500 mg by mouth daily.    . sildenafil (VIAGRA) 100 MG tablet Take by mouth.     No current facility-administered medications for this visit.    OBJECTIVE: Filed Vitals:   06/28/15 1137  BP: 117/76  Pulse: 79  Temp: 97 F (36.1 C)  Resp: 16     Body mass index is 29.35 kg/(m^2).    ECOG FS:0 - Asymptomatic  General: Well-developed, well-nourished, no acute distress. Eyes: anicteric sclera. Neck: No palpable lymphadenopathy. Lungs: Clear to auscultation bilaterally. Heart: Regular rate and rhythm. No rubs, murmurs, or gallops. Abdomen: Soft, nontender, nondistended. No organomegaly noted, normoactive bowel sounds. Musculoskeletal: No edema, cyanosis, or clubbing. Neuro: Alert, answering all questions appropriately. Cranial nerves grossly intact. Skin: No erythema or rash Psych: Normal affect. Lymphatics: No cervical, calvicular, axillary or inguinal LAD.   LAB RESULTS:  Lab Results  Component Value Date   NA 137 06/18/2015   K 3.5 06/18/2015   CL 104 06/18/2015   CO2 28 06/18/2015   GLUCOSE 109* 06/18/2015   BUN 11 06/18/2015   CREATININE 1.00 06/18/2015   CALCIUM 8.2* 06/18/2015   PROT 6.0* 06/18/2015   ALBUMIN 4.0 06/18/2015   AST 22 06/18/2015   ALT 17 06/18/2015   ALKPHOS 61 06/18/2015   BILITOT  0.7 06/18/2015   GFRNONAA >60 06/18/2015   GFRAA >60 06/18/2015    Lab Results  Component Value Date   WBC 5.6 06/18/2015   NEUTROABS 3.7 06/18/2015   HGB 14.0 06/18/2015   HCT 41.8 06/18/2015   MCV 91.1 06/18/2015   PLT 153 06/18/2015     STUDIES: Ct Soft Tissue Neck W Contrast  06/18/2015  CLINICAL DATA:  70 year old male restaging recurrent follicular lymphoma. Subsequent encounter. EXAM: CT NECK WITH CONTRAST TECHNIQUE: Multidetector CT imaging of the neck was performed using the standard protocol following the bolus  administration of intravenous contrast. CONTRAST:  127mL OMNIPAQUE IOHEXOL 350 MG/ML SOLN in conjunction with contrast enhanced imaging of the face, chest, abdomen, and pelvis reported separately. COMPARISON:  Neck CT 06/06/2014.  Face CT 12/13/2014. FINDINGS: Pharynx and larynx: Laryngeal contour remains within normal limits, the glottis is closed. The oropharynx is more effaced today, but pharyngeal soft tissue contours remain within normal limits. Negative parapharyngeal and retropharyngeal spaces. Salivary glands: Sublingual space, submandibular glands, and parotid glands are stable and within normal limits. Thyroid: Negative. Lymph nodes: Previously described right level III (series 3, image 70 today) and right level IV conspicuous but sub-centimeter lymph nodes are stable since September. However, a right level IV/peritracheal node at the thoracic inlet on series 3, image 90 now measures 9 mm short axis and was 4-5 mm in March. Furthermore, two right side supraclavicular nodes not included on the September study are mildly increased compared to March on series 3, image 67, up to 8 mm short axis. Similar increase in contralateral left supraclavicular nodes since March, now up to 9 mm (previously 5 mm). Also, there are several nonspecific posterior subcutaneous soft tissue nodules with enhancement and spiculated margins, and that on series 3, image 57 has increased in transaxial size since March (currently 15 x 23 mm versus 10-11 mm diameter previously). A small right level V lymph node on series 3, image 50 also has mildly increased since March. Sub-centimeter right level IIa and IIB lymph nodes also have increased in size and number since March and since September (series 3, image 46 today versus series 3, image 53 in September. By comparison left side level II nodes appear stable. Vascular: Right chest porta cath remains in place. Major vascular structures in the neck and at the skullbase are patent. Limited  intracranial: Negative. Visualized orbits: Negative. Mastoids and visualized paranasal sinuses: Stable and negative paranasal sinuses. Skeleton: Stable visualized osseous structures. No acute or suspicious osseous lesion identified in the neck. Upper chest: Reported separately today. IMPRESSION: 1. Mildly increased right greater than left cervical lymph node size and number since September, and moderate increase since the prior neck CT in March 2016, compatible with progression of lymphoma. 2. CT face, chest abdomen and pelvis reported separately today. Electronically Signed   By: Genevie Ann M.D.   On: 06/18/2015 16:55   Ct Chest W Contrast  06/18/2015  CLINICAL DATA:  Restaging follicular lymphoma, treated with chemotherapy and radiation therapy. EXAM: CT CHEST, ABDOMEN, AND PELVIS WITH CONTRAST TECHNIQUE: Multidetector CT imaging of the chest, abdomen and pelvis was performed following the standard protocol during bolus administration of intravenous contrast. CONTRAST:  159mL OMNIPAQUE IOHEXOL 350 MG/ML SOLN COMPARISON:  12/13/2014 CT chest, abdomen and pelvis. FINDINGS: CT CHEST Mediastinum/Nodes: Normal heart size. No pericardial fluid/thickening. Right subclavian MediPort terminates in the lower third of the superior vena cava. Great vessels are normal in course and caliber. No central pulmonary emboli. Normal visualized thyroid. Normal  esophagus. There are several top-normal size and mildly enlarged axillary nodes bilaterally, which have increased in size, for example a 1.0 cm right lower axillary node (series 3/ image 26) is increased from 0.8 cm and a 1.1 cm left axillary node (series 3/image 16) is minimally increased from 1.0 cm. Mild growth of top-normal size bilateral supraclavicular nodes measuring 0.7 cm on the right (series 3/ image 1), previously 0.5 cm and 0.8 cm on the left (series 3/image 4), previously 0.6 cm. No additional pathologically enlarged mediastinal or hilar nodes. Lungs/Pleura: No  pneumothorax. No pleural effusion. Left upper lobe 4 mm solid pulmonary nodule (series 5/ image 10), unchanged since 04/29/2011 PET-CT and benign. No acute consolidative airspace disease or new significant pulmonary nodules. Dependent density in the lower lobes is favored to represent hypoventilatory change. Musculoskeletal: No aggressive appearing focal osseous lesions. Mild degenerative changes in the thoracic spine. CT ABDOMEN AND PELVIS Hepatobiliary: Suggestion of diffuse hepatic steatosis. No liver mass. Subcentimeter calcified layering gallstone in the nondistended gallbladder, with no gallbladder wall thickening or pericholecystic fluid. No biliary ductal dilatation. Pancreas: Normal, with no mass or duct dilation. Spleen: Normal size spleen. Stable solitary subcentimeter hypodense medial upper splenic lesion, too small to characterize. Adrenals/Urinary Tract: Stable 1.4 cm right adrenal and 2.7 cm left adrenal adenomas, unchanged in size since 04/29/2011 PET-CT. No hydronephrosis. Subcentimeter hypodense renal cortical lesions in both kidneys are too small to characterize and are unchanged, probably benign renal cysts. Stable tiny urachal diverticulum at the bladder dome in the midline. Otherwise normal bladder. Stomach/Bowel: Grossly normal stomach. Normal caliber small bowel with no small bowel wall thickening. Normal appendix. Mild to moderate sigmoid diverticulosis, with no large bowel wall thickening or pericolonic fat stranding. Oral contrast progresses to the distal colon. Vascular/Lymphatic: Normal caliber abdominal aorta. Insert vein No pathologically enlarged lymph nodes in the abdomen or pelvis. Nodule between the stomach and xiphoid process measure 0.9 cm short axis (series 3/image 49), decreased from 1.3 cm. Reproductive: Stable moderate prostatomegaly with nonspecific internal prostatic calcifications. Other: No pneumoperitoneum, ascites or focal fluid collection. Mild haziness in central  mesentery without adenopathy, not appreciably changed back to 04/29/2011, favor mild panniculitis. Musculoskeletal: No aggressive appearing focal osseous lesions. Moderate degenerative changes in the lumbar spine. IMPRESSION: 1. Mixed interval treatment response. Mild bilateral axillary lymphadenopathy has mildly increased. Top-normal size bilateral supraclavicular nodes have mildly increased. Nodule between the xiphoid process and stomach has decreased in size. No new sites of recurrent lymphoma in the chest, abdomen or pelvis. 2. Additional findings include diffuse hepatic steatosis, cholelithiasis, bilateral adrenal adenomas, sigmoid diverticulosis, moderate prostatomegaly and chronic central mesenteric haziness favoring mild panniculitis. Electronically Signed   By: Ilona Sorrel M.D.   On: 06/18/2015 17:13   Ct Abdomen Pelvis W Contrast  06/18/2015  CLINICAL DATA:  Restaging follicular lymphoma, treated with chemotherapy and radiation therapy. EXAM: CT CHEST, ABDOMEN, AND PELVIS WITH CONTRAST TECHNIQUE: Multidetector CT imaging of the chest, abdomen and pelvis was performed following the standard protocol during bolus administration of intravenous contrast. CONTRAST:  166mL OMNIPAQUE IOHEXOL 350 MG/ML SOLN COMPARISON:  12/13/2014 CT chest, abdomen and pelvis. FINDINGS: CT CHEST Mediastinum/Nodes: Normal heart size. No pericardial fluid/thickening. Right subclavian MediPort terminates in the lower third of the superior vena cava. Great vessels are normal in course and caliber. No central pulmonary emboli. Normal visualized thyroid. Normal esophagus. There are several top-normal size and mildly enlarged axillary nodes bilaterally, which have increased in size, for example a 1.0 cm right lower axillary  node (series 3/ image 26) is increased from 0.8 cm and a 1.1 cm left axillary node (series 3/image 16) is minimally increased from 1.0 cm. Mild growth of top-normal size bilateral supraclavicular nodes measuring  0.7 cm on the right (series 3/ image 1), previously 0.5 cm and 0.8 cm on the left (series 3/image 4), previously 0.6 cm. No additional pathologically enlarged mediastinal or hilar nodes. Lungs/Pleura: No pneumothorax. No pleural effusion. Left upper lobe 4 mm solid pulmonary nodule (series 5/ image 10), unchanged since 04/29/2011 PET-CT and benign. No acute consolidative airspace disease or new significant pulmonary nodules. Dependent density in the lower lobes is favored to represent hypoventilatory change. Musculoskeletal: No aggressive appearing focal osseous lesions. Mild degenerative changes in the thoracic spine. CT ABDOMEN AND PELVIS Hepatobiliary: Suggestion of diffuse hepatic steatosis. No liver mass. Subcentimeter calcified layering gallstone in the nondistended gallbladder, with no gallbladder wall thickening or pericholecystic fluid. No biliary ductal dilatation. Pancreas: Normal, with no mass or duct dilation. Spleen: Normal size spleen. Stable solitary subcentimeter hypodense medial upper splenic lesion, too small to characterize. Adrenals/Urinary Tract: Stable 1.4 cm right adrenal and 2.7 cm left adrenal adenomas, unchanged in size since 04/29/2011 PET-CT. No hydronephrosis. Subcentimeter hypodense renal cortical lesions in both kidneys are too small to characterize and are unchanged, probably benign renal cysts. Stable tiny urachal diverticulum at the bladder dome in the midline. Otherwise normal bladder. Stomach/Bowel: Grossly normal stomach. Normal caliber small bowel with no small bowel wall thickening. Normal appendix. Mild to moderate sigmoid diverticulosis, with no large bowel wall thickening or pericolonic fat stranding. Oral contrast progresses to the distal colon. Vascular/Lymphatic: Normal caliber abdominal aorta. Insert vein No pathologically enlarged lymph nodes in the abdomen or pelvis. Nodule between the stomach and xiphoid process measure 0.9 cm short axis (series 3/image 49),  decreased from 1.3 cm. Reproductive: Stable moderate prostatomegaly with nonspecific internal prostatic calcifications. Other: No pneumoperitoneum, ascites or focal fluid collection. Mild haziness in central mesentery without adenopathy, not appreciably changed back to 04/29/2011, favor mild panniculitis. Musculoskeletal: No aggressive appearing focal osseous lesions. Moderate degenerative changes in the lumbar spine. IMPRESSION: 1. Mixed interval treatment response. Mild bilateral axillary lymphadenopathy has mildly increased. Top-normal size bilateral supraclavicular nodes have mildly increased. Nodule between the xiphoid process and stomach has decreased in size. No new sites of recurrent lymphoma in the chest, abdomen or pelvis. 2. Additional findings include diffuse hepatic steatosis, cholelithiasis, bilateral adrenal adenomas, sigmoid diverticulosis, moderate prostatomegaly and chronic central mesenteric haziness favoring mild panniculitis. Electronically Signed   By: Ilona Sorrel M.D.   On: 06/18/2015 17:13   Ct Maxillofacial W/cm  06/18/2015  CLINICAL DATA:  70 year old male restaging recurrent follicular lymphoma. Site of recurrence the optic nerve. Subsequent encounter. EXAM: CT MAXILLOFACIAL WITH CONTRAST TECHNIQUE: Multidetector CT imaging of the maxillofacial structures was performed with intravenous contrast. Multiplanar CT image reconstructions were also generated. A small metallic BB was placed on the right temple in order to reliably differentiate right from left. CONTRAST:  127mL OMNIPAQUE IOHEXOL 350 MG/ML SOLN in conjunction with contrast enhanced imaging of the neck, chest, abdomen, and pelvis reported separately. COMPARISON:  CT face 12/13/2014. Neck CT from today reported separately. FINDINGS: As described on the neck CT today (please see that report), there is been mild progression of right level 2A and level 2B lymph node size and number since September (e.g. Series 3, image 15 today  versus series 3, image 55 previously). Visualized pharyngeal soft tissue contours are stable. Deep soft tissue spaces  of the face otherwise are stable and within normal limits. Major vascular structures in the visible neck and at the skullbase are patent. Negative visualized brain parenchyma. Stable and negative CT appearance of the orbits. No intraorbital mass or inflammation identified. Cavernous sinus appears normal. Mild left maxillary sinus mucosal thickening. Other paranasal sinuses and mastoids are clear. No acute or suspicious osseous abnormality identified in the face. IMPRESSION: 1. Mildly increased size and number of right greater than left cervical lymph nodes which is detailed on the neck CT today (please see that report). 2. Other face soft tissues remain normal. No orbital mass identified. Electronically Signed   By: Genevie Ann M.D.   On: 06/18/2015 17:00    ASSESSMENT: Recurrent, stage IV grade 2 follicular lymphoma.  PLAN:    1.  Follicular lymphoma: Patient completed his rituximab and Zevalin therapy in June 2014.  Patient had recurrence in his right orbit. CT scan results reviewed independently and reported as above with minimal increase in several lymph nodes.  Although lymph nodes are nonpathologic, given his history will monitor closely. Return to clinic in 6 months with repeat laboratory work imaging, and further evaluation.     Patient expressed understanding and was in agreement with this plan. He also understands that He can call clinic at any time with any questions, concerns, or complaints.    Mayra Reel, NP   06/28/2015 12:04 PM  Patient was seen and evaluated independently and I agree with the assessment and plan as dictated above.  Lloyd Huger, MD 07/05/2015 4:03 PM

## 2015-07-31 ENCOUNTER — Inpatient Hospital Stay: Payer: Medicare Other | Attending: Oncology

## 2015-07-31 DIAGNOSIS — Z9221 Personal history of antineoplastic chemotherapy: Secondary | ICD-10-CM | POA: Diagnosis not present

## 2015-07-31 DIAGNOSIS — Z452 Encounter for adjustment and management of vascular access device: Secondary | ICD-10-CM | POA: Diagnosis not present

## 2015-07-31 DIAGNOSIS — Z95828 Presence of other vascular implants and grafts: Secondary | ICD-10-CM

## 2015-07-31 DIAGNOSIS — Z923 Personal history of irradiation: Secondary | ICD-10-CM | POA: Insufficient documentation

## 2015-07-31 DIAGNOSIS — C8210 Follicular lymphoma grade II, unspecified site: Secondary | ICD-10-CM | POA: Diagnosis not present

## 2015-07-31 MED ORDER — SODIUM CHLORIDE 0.9% FLUSH
10.0000 mL | INTRAVENOUS | Status: DC | PRN
  Administered 2015-07-31: 10 mL via INTRAVENOUS
  Filled 2015-07-31: qty 10

## 2015-07-31 MED ORDER — HEPARIN SOD (PORK) LOCK FLUSH 100 UNIT/ML IV SOLN
500.0000 [IU] | Freq: Once | INTRAVENOUS | Status: AC
  Administered 2015-07-31: 500 [IU] via INTRAVENOUS

## 2015-07-31 MED ORDER — HEPARIN SOD (PORK) LOCK FLUSH 100 UNIT/ML IV SOLN
INTRAVENOUS | Status: AC
  Filled 2015-07-31: qty 5

## 2015-09-11 ENCOUNTER — Inpatient Hospital Stay: Payer: Medicare Other | Attending: Oncology

## 2015-09-11 VITALS — BP 126/80 | HR 67 | Temp 96.7°F | Resp 20

## 2015-09-11 DIAGNOSIS — Z95828 Presence of other vascular implants and grafts: Secondary | ICD-10-CM

## 2015-09-11 DIAGNOSIS — Z452 Encounter for adjustment and management of vascular access device: Secondary | ICD-10-CM | POA: Diagnosis not present

## 2015-09-11 DIAGNOSIS — C8210 Follicular lymphoma grade II, unspecified site: Secondary | ICD-10-CM | POA: Insufficient documentation

## 2015-09-11 DIAGNOSIS — Z9221 Personal history of antineoplastic chemotherapy: Secondary | ICD-10-CM | POA: Diagnosis not present

## 2015-09-11 DIAGNOSIS — Z923 Personal history of irradiation: Secondary | ICD-10-CM | POA: Diagnosis not present

## 2015-09-11 MED ORDER — HEPARIN SOD (PORK) LOCK FLUSH 100 UNIT/ML IV SOLN
500.0000 [IU] | Freq: Once | INTRAVENOUS | Status: AC
  Administered 2015-09-11: 500 [IU] via INTRAVENOUS

## 2015-09-11 MED ORDER — HEPARIN SOD (PORK) LOCK FLUSH 100 UNIT/ML IV SOLN
INTRAVENOUS | Status: AC
  Filled 2015-09-11: qty 5

## 2015-09-11 MED ORDER — SODIUM CHLORIDE 0.9% FLUSH
10.0000 mL | INTRAVENOUS | Status: DC | PRN
  Administered 2015-09-11: 10 mL via INTRAVENOUS
  Filled 2015-09-11: qty 10

## 2015-10-23 ENCOUNTER — Inpatient Hospital Stay: Payer: Medicare Other | Attending: Oncology

## 2015-10-23 VITALS — BP 116/77 | HR 75 | Temp 96.6°F | Resp 20

## 2015-10-23 DIAGNOSIS — Z95828 Presence of other vascular implants and grafts: Secondary | ICD-10-CM

## 2015-10-23 DIAGNOSIS — Z452 Encounter for adjustment and management of vascular access device: Secondary | ICD-10-CM | POA: Diagnosis not present

## 2015-10-23 DIAGNOSIS — Z923 Personal history of irradiation: Secondary | ICD-10-CM | POA: Insufficient documentation

## 2015-10-23 DIAGNOSIS — Z9221 Personal history of antineoplastic chemotherapy: Secondary | ICD-10-CM | POA: Insufficient documentation

## 2015-10-23 DIAGNOSIS — C8210 Follicular lymphoma grade II, unspecified site: Secondary | ICD-10-CM | POA: Diagnosis present

## 2015-10-23 MED ORDER — SODIUM CHLORIDE 0.9% FLUSH
10.0000 mL | INTRAVENOUS | Status: DC | PRN
  Administered 2015-10-23: 10 mL via INTRAVENOUS
  Filled 2015-10-23: qty 10

## 2015-10-23 MED ORDER — HEPARIN SOD (PORK) LOCK FLUSH 100 UNIT/ML IV SOLN
500.0000 [IU] | Freq: Once | INTRAVENOUS | Status: AC
  Administered 2015-10-23: 500 [IU] via INTRAVENOUS

## 2015-12-04 ENCOUNTER — Inpatient Hospital Stay: Payer: Medicare Other

## 2015-12-11 ENCOUNTER — Other Ambulatory Visit: Payer: Self-pay | Admitting: *Deleted

## 2015-12-11 ENCOUNTER — Other Ambulatory Visit: Payer: Medicare Other

## 2015-12-11 DIAGNOSIS — C8590 Non-Hodgkin lymphoma, unspecified, unspecified site: Secondary | ICD-10-CM

## 2015-12-13 ENCOUNTER — Inpatient Hospital Stay: Payer: Medicare Other | Attending: Oncology

## 2015-12-13 ENCOUNTER — Inpatient Hospital Stay: Payer: Medicare Other

## 2015-12-13 DIAGNOSIS — Z801 Family history of malignant neoplasm of trachea, bronchus and lung: Secondary | ICD-10-CM | POA: Insufficient documentation

## 2015-12-13 DIAGNOSIS — Z9221 Personal history of antineoplastic chemotherapy: Secondary | ICD-10-CM | POA: Diagnosis not present

## 2015-12-13 DIAGNOSIS — R590 Localized enlarged lymph nodes: Secondary | ICD-10-CM | POA: Insufficient documentation

## 2015-12-13 DIAGNOSIS — Z923 Personal history of irradiation: Secondary | ICD-10-CM | POA: Insufficient documentation

## 2015-12-13 DIAGNOSIS — Z7982 Long term (current) use of aspirin: Secondary | ICD-10-CM | POA: Insufficient documentation

## 2015-12-13 DIAGNOSIS — Z79899 Other long term (current) drug therapy: Secondary | ICD-10-CM | POA: Insufficient documentation

## 2015-12-13 DIAGNOSIS — Z800 Family history of malignant neoplasm of digestive organs: Secondary | ICD-10-CM | POA: Insufficient documentation

## 2015-12-13 DIAGNOSIS — K219 Gastro-esophageal reflux disease without esophagitis: Secondary | ICD-10-CM | POA: Insufficient documentation

## 2015-12-13 DIAGNOSIS — G4730 Sleep apnea, unspecified: Secondary | ICD-10-CM | POA: Diagnosis not present

## 2015-12-13 DIAGNOSIS — Z452 Encounter for adjustment and management of vascular access device: Secondary | ICD-10-CM | POA: Insufficient documentation

## 2015-12-13 DIAGNOSIS — C8211 Follicular lymphoma grade II, lymph nodes of head, face, and neck: Secondary | ICD-10-CM | POA: Diagnosis present

## 2015-12-13 DIAGNOSIS — C8590 Non-Hodgkin lymphoma, unspecified, unspecified site: Secondary | ICD-10-CM

## 2015-12-13 DIAGNOSIS — R221 Localized swelling, mass and lump, neck: Secondary | ICD-10-CM | POA: Insufficient documentation

## 2015-12-13 LAB — COMPREHENSIVE METABOLIC PANEL
ALBUMIN: 3.9 g/dL (ref 3.5–5.0)
ALK PHOS: 66 U/L (ref 38–126)
ALT: 19 U/L (ref 17–63)
ANION GAP: 7 (ref 5–15)
AST: 21 U/L (ref 15–41)
BILIRUBIN TOTAL: 1 mg/dL (ref 0.3–1.2)
BUN: 10 mg/dL (ref 6–20)
CALCIUM: 8.5 mg/dL — AB (ref 8.9–10.3)
CO2: 27 mmol/L (ref 22–32)
CREATININE: 1.03 mg/dL (ref 0.61–1.24)
Chloride: 105 mmol/L (ref 101–111)
GFR calc Af Amer: >60 mL/min (ref >60)
GFR calc non Af Amer: >60 mL/min (ref >60)
GLUCOSE: 115 mg/dL — AB (ref 65–99)
Potassium: 3.7 mmol/L (ref 3.5–5.1)
SODIUM: 139 mmol/L (ref 135–145)
TOTAL PROTEIN: 6.5 g/dL (ref 6.5–8.1)

## 2015-12-13 LAB — CBC WITH DIFFERENTIAL/PLATELET
BASOS PCT: 2 %
Basophils Absolute: 0.1 10*3/uL (ref 0–0.1)
EOS ABS: 0.1 10*3/uL (ref 0–0.7)
Eosinophils Relative: 2 %
HEMATOCRIT: 41.3 % (ref 40.0–52.0)
HEMOGLOBIN: 14 g/dL (ref 13.0–18.0)
LYMPHS ABS: 1.2 10*3/uL (ref 1.0–3.6)
Lymphocytes Relative: 24 %
MCH: 31 pg (ref 26.0–34.0)
MCHC: 34 g/dL (ref 32.0–36.0)
MCV: 91.2 fL (ref 80.0–100.0)
MONOS PCT: 9 %
Monocytes Absolute: 0.5 10*3/uL (ref 0.2–1.0)
NEUTROS ABS: 3.2 10*3/uL (ref 1.4–6.5)
NEUTROS PCT: 63 %
Platelets: 143 10*3/uL — ABNORMAL LOW (ref 150–440)
RBC: 4.53 MIL/uL (ref 4.40–5.90)
RDW: 13.1 % (ref 11.5–14.5)
WBC: 5 10*3/uL (ref 3.8–10.6)

## 2015-12-13 LAB — LACTATE DEHYDROGENASE: LDH: 134 U/L (ref 98–192)

## 2015-12-13 MED ORDER — SODIUM CHLORIDE 0.9% FLUSH
10.0000 mL | INTRAVENOUS | Status: DC | PRN
  Administered 2015-12-13: 10 mL via INTRAVENOUS
  Filled 2015-12-13: qty 10

## 2015-12-13 MED ORDER — HEPARIN SOD (PORK) LOCK FLUSH 100 UNIT/ML IV SOLN
500.0000 [IU] | Freq: Once | INTRAVENOUS | Status: AC
  Administered 2015-12-13: 500 [IU] via INTRAVENOUS

## 2015-12-17 ENCOUNTER — Telehealth: Payer: Self-pay | Admitting: *Deleted

## 2015-12-17 NOTE — Telephone Encounter (Signed)
error 

## 2015-12-18 ENCOUNTER — Other Ambulatory Visit: Payer: Self-pay | Admitting: Oncology

## 2015-12-18 ENCOUNTER — Ambulatory Visit
Admission: RE | Admit: 2015-12-18 | Discharge: 2015-12-18 | Disposition: A | Discharge status: Home / Self Care | Payer: Medicare Other | Source: Ambulatory Visit | Attending: Oncology | Admitting: Oncology

## 2015-12-18 DIAGNOSIS — Z8572 Personal history of non-Hodgkin lymphomas: Secondary | ICD-10-CM | POA: Diagnosis present

## 2015-12-18 DIAGNOSIS — R599 Enlarged lymph nodes, unspecified: Secondary | ICD-10-CM | POA: Diagnosis not present

## 2015-12-18 DIAGNOSIS — C8299 Follicular lymphoma, unspecified, extranodal and solid organ sites: Secondary | ICD-10-CM

## 2015-12-18 DIAGNOSIS — M5030 Other cervical disc degeneration, unspecified cervical region: Secondary | ICD-10-CM | POA: Diagnosis not present

## 2015-12-18 MED ORDER — IOPAMIDOL (ISOVUE-370) INJECTION 76%
125.0000 mL | Freq: Once | INTRAVENOUS | Status: AC | PRN
  Administered 2015-12-18: 100 mL via INTRAVENOUS

## 2015-12-26 DIAGNOSIS — C8211 Follicular lymphoma grade II, lymph nodes of head, face, and neck: Principal | ICD-10-CM

## 2015-12-26 DIAGNOSIS — C8510 Unspecified B-cell lymphoma, unspecified site: Secondary | ICD-10-CM | POA: Insufficient documentation

## 2015-12-26 NOTE — Progress Notes (Signed)
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  He currently feels well and is asymptomatic. He has no visual changes.  He has no neurologic complaints.  He denies any fevers, weight loss, or night sweats.  He has noted no new lymphadenopathy.  He denies any chest pain or shortness of breath.  He denies any nausea, vomiting, constipation, or diarrhea.  He has no urinary complaints.  Patient offers no specific complaints today.  REVIEW OF SYSTEMS:   Review of Systems  Constitutional: Negative for diaphoresis, fever, malaise/fatigue and weight loss.  Eyes: Negative.  Negative for blurred vision, double vision and pain.  Respiratory: Negative.  Negative for cough and shortness of breath.   Cardiovascular: Negative.  Negative for chest pain and leg swelling.  Gastrointestinal: Negative.  Negative for abdominal pain.  Genitourinary: Negative.   Musculoskeletal: Negative.   Neurological: Negative.  Negative for weakness.  Psychiatric/Behavioral: Negative.  The patient is not nervous/anxious.     As per HPI. Otherwise, a complete review of systems is negative.  PAST MEDICAL HISTORY: Past Medical History:  Diagnosis Date  . Follicular lymphoma ([place]) 2009   Patient had chemo + rad tx's and zevaline shots.  Marland Kitchen GERD (gastroesophageal reflux disease)   . Hemorrhoids   . Sleep apnea     PAST SURGICAL HISTORY: Hemorrhoidectomy.  FAMILY HISTORY: Colon cancer and lung cancer.     ADVANCED DIRECTIVES:    HEALTH MAINTENANCE: Social History  Substance Use Topics  . Smoking status: Never Smoker  . Smokeless tobacco: Never  Used  . Alcohol use Yes     Comment: occasional     No Known Allergies  Current Outpatient Prescriptions  Medication Sig Dispense Refill  . amLODipine (NORVASC) 5 MG tablet Take 5 mg by mouth daily.     Marland Kitchen aspirin EC 81 MG tablet Take 81 mg by mouth daily.     Marland Kitchen atorvastatin (LIPITOR) 10 MG tablet Take 10 mg by mouth daily at 6 PM.     . Calcium Carbonate-Vitamin D 600-400 MG-UNIT per tablet Take 1 tablet by mouth 2 (two) times daily.     . cetirizine (ZYRTEC) 10 MG tablet Take 10 mg by mouth daily.    . Multiple Vitamin (MULTI-VITAMINS) TABS Take 1 tablet by mouth daily.     Marland Kitchen omeprazole (PRILOSEC) 40 MG capsule Take 40 mg by mouth daily.     . saw palmetto 500 MG capsule Take 500 mg by mouth daily.    . sildenafil (VIAGRA) 100 MG tablet Take 100 mg by mouth as needed.      No current facility-administered medications for this visit.     OBJECTIVE: Vitals:   12/27/15 1040  BP: 132/80  Pulse: 82  Resp: 18  Temp: 97.8 F (36.6 C)     Body mass index is 29.7 kg/m.    ECOG FS:0 - Asymptomatic  General: Well-developed, well-nourished, no acute distress. Eyes: anicteric sclera. Neck: No palpable lymphadenopathy. Lungs: Clear to auscultation bilaterally. Heart: Regular rate and rhythm. No rubs, murmurs, or gallops. Abdomen: Soft, nontender, nondistended. No organomegaly noted, normoactive bowel sounds. Musculoskeletal: No edema, cyanosis, or clubbing. Neuro: Alert, answering all questions appropriately.  Cranial nerves grossly intact. Skin: No erythema or rash Psych: Normal affect. Lymphatics: No cervical, calvicular, axillary or inguinal LAD.   LAB RESULTS:  Lab Results  Component Value Date   NA 139 12/13/2015   K 3.7 12/13/2015   CL 105 12/13/2015   CO2 27 12/13/2015   GLUCOSE 115 (H) 12/13/2015   BUN 10 12/13/2015   CREATININE 1.03 12/13/2015   CALCIUM 8.5 (L) 12/13/2015   PROT 6.5 12/13/2015   ALBUMIN 3.9 12/13/2015   AST 21 12/13/2015   ALT 19 12/13/2015    ALKPHOS 66 12/13/2015   BILITOT 1.0 12/13/2015   GFRNONAA >60 12/13/2015   GFRAA >60 12/13/2015    Lab Results  Component Value Date   WBC 5.0 12/13/2015   NEUTROABS 3.2 12/13/2015   HGB 14.0 12/13/2015   HCT 41.3 12/13/2015   MCV 91.2 12/13/2015   PLT 143 (L) 12/13/2015     STUDIES: Ct Soft Tissue Neck W Contrast  Result Date: 12/18/2015 CLINICAL DATA:  Follicular lymphoma.  Restaging. EXAM: CT NECK WITH CONTRAST TECHNIQUE: Multidetector CT imaging of the neck was performed using the standard protocol following the bolus administration of intravenous contrast. CONTRAST:  100 mL Isovue 370 COMPARISON:  06/18/2015 FINDINGS: Pharynx and larynx: The nasopharynx is unremarkable. Mild symmetric soft tissue prominence in the oropharynx is unchanged and without a discrete mass identified. No laryngeal mass. Salivary glands: Submandibular and parotid glands are unchanged and unremarkable in appearance aside from a small left parotid calcification. Thyroid: Unremarkable. Lymph nodes: Right level IIa lymph nodes have mildly enlarged, now measuring up to 9 mm in short axis (series 3, image 42, previously 7 mm). Right supraclavicular lymph nodes measure up to 8 mm inter unchanged (series 3, image 57). 7 mm right level III lymph node is unchanged (series 3, image 63). Two adjacent right level IV lymph nodes appear slightly larger and are now inseparable from one another, in total measuring 9 mm in short axis (series 3, image 73). 9 mm right paratracheal lymph node at the level of the thoracic inlet is unchanged (series 3, image 87). Two adjacent left retroclavicular lymph nodes measure 9 mm in short axis each (series 3, images 73 and 75, previously 7 mm). Left supraclavicular lymph nodes measure up to 10 mm in short axis and overall have slightly enlarged (series 3, image 61). Subcutaneous soft tissue nodules in the posterior neck overall appear minimally smaller, for instance a 21 x 13 mm nodule on the right  (series 3, image 53, previously 23 x 15 mm). Vascular: Small or occluded left internal jugular vein in the lower neck is unchanged. A right subclavian Port-A-Cath is partially visualized. Limited intracranial: Unremarkable. Visualized orbits: Prior right cataract extraction. No orbital mass. Mastoids and visualized paranasal sinuses: Clear. Skeleton: No suspicious lytic or blastic osseous lesions identified. Advanced disc degeneration in the lower cervical spine. Upper chest: Reported separately. IMPRESSION: 1. Overall mildly increased size of bilateral cervical lymph nodes. 2. Minimally decreased size of a subcutaneous nodule in the posterior right neck. Electronically Signed   By: Logan Bores M.D.   On: 12/18/2015 17:04   Ct Chest W Contrast  Result Date: 12/19/2015 CLINICAL DATA:  Restaging follicular lymphoma, original diagnosis 2009. Chemotherapy and radiation therapy. EXAM: CT CHEST, ABDOMEN, AND PELVIS WITH CONTRAST TECHNIQUE: Multidetector CT imaging of the chest, abdomen and pelvis was performed following the standard protocol during bolus administration of intravenous contrast. CONTRAST:  125 cc Isovue 370 COMPARISON:  Multiple exams, including 06/18/2015 FINDINGS:  CT CHEST FINDINGS Cardiovascular: Mild atherosclerotic calcification of the aortic arch. Right IJ Port-A-Cath tip: SVC. Mediastinum/Nodes: Bilateral scattered axillary lymph nodes, the index lateral left axillary node has short axis diameter of 1.1 cm on image 16/3, unchanged. Index right lower axillary lymph node has a short axis diameter of 1.0 cm on image 28/3, unchanged. Sub xiphoid nodule measures 9 mm in short axis on image 51/3, unchanged. Supraclavicular nodes reported on today's neck CT. Lungs/Pleura: Azygos fissure. Dependent subsegmental atelectasis in the lower lobes. 4 by 3 mm left upper lobe nodule on image 35/6, unchanged from 11/27/2013, likely benign. Mild scarring posteriorly in the lingula. Persistent peripheral band of  airspace opacity in the left lower lobe unchanged from prior, likely chronic atelectasis/ scarring. Musculoskeletal: Degenerative glenohumeral arthropathy, left greater than right. Thoracic spondylosis. CT ABDOMEN PELVIS FINDINGS Hepatobiliary: Diffuse hepatic steatosis. 4 mm gallstone dependently in the gallbladder. Pancreas: Unremarkable Spleen: Unremarkable Adrenals/Urinary Tract: No appreciable change in the bilateral adrenal adenomas. 10 mm in long axis left kidney upper pole hypodense lesion, stable, likely a cyst. Stomach/Bowel: Sigmoid colon diverticulosis. Vascular/Lymphatic: No pathologic adenopathy in the abdomen or pelvis. Reproductive: Prostate gland 6.5 by 4.3 by 5.6 cm (volume = 82 cm^3). Curvilinear calcifications along the posterior periphery of the central gland. Other: Stranding in the central mesentery, unchanged from prior. Musculoskeletal: Mild levoconvex lumbar scoliosis with multilevel degenerative facet arthropathy and mild degenerative disc disease, resulting an in left foraminal impingement at L5-S1 and L4-5; and a right foraminal impingement at L2-3 and L3-4. Probable central narrowing of the thecal sac at the L3-4 level. Small umbilical hernia contains adipose tissue and a small amount of fluid. IMPRESSION: 1. Stable CT appearance of the chest, abdomen, and pelvis, with stable mild bilateral axillary adenopathy and stable subxiphoid adenopathy. 2. Other imaging findings of potential clinical significance: Mild atherosclerotic calcification of the aortic arch. Diffuse hepatic steatosis. Cholelithiasis. Sigmoid diverticulosis. Enlarged prostate gland. Lumbar spondylosis and degenerative disc disease causing multilevel impingement. Chronic hazy stranding in the central mesentery. Electronically Signed   By: Van Clines M.D.   On: 12/19/2015 08:29   Ct Abdomen Pelvis W Contrast  Result Date: 12/19/2015 CLINICAL DATA:  Restaging follicular lymphoma, original diagnosis 2009.  Chemotherapy and radiation therapy. EXAM: CT CHEST, ABDOMEN, AND PELVIS WITH CONTRAST TECHNIQUE: Multidetector CT imaging of the chest, abdomen and pelvis was performed following the standard protocol during bolus administration of intravenous contrast. CONTRAST:  125 cc Isovue 370 COMPARISON:  Multiple exams, including 06/18/2015 FINDINGS: CT CHEST FINDINGS Cardiovascular: Mild atherosclerotic calcification of the aortic arch. Right IJ Port-A-Cath tip: SVC. Mediastinum/Nodes: Bilateral scattered axillary lymph nodes, the index lateral left axillary node has short axis diameter of 1.1 cm on image 16/3, unchanged. Index right lower axillary lymph node has a short axis diameter of 1.0 cm on image 28/3, unchanged. Sub xiphoid nodule measures 9 mm in short axis on image 51/3, unchanged. Supraclavicular nodes reported on today's neck CT. Lungs/Pleura: Azygos fissure. Dependent subsegmental atelectasis in the lower lobes. 4 by 3 mm left upper lobe nodule on image 35/6, unchanged from 11/27/2013, likely benign. Mild scarring posteriorly in the lingula. Persistent peripheral band of airspace opacity in the left lower lobe unchanged from prior, likely chronic atelectasis/ scarring. Musculoskeletal: Degenerative glenohumeral arthropathy, left greater than right. Thoracic spondylosis. CT ABDOMEN PELVIS FINDINGS Hepatobiliary: Diffuse hepatic steatosis. 4 mm gallstone dependently in the gallbladder. Pancreas: Unremarkable Spleen: Unremarkable Adrenals/Urinary Tract: No appreciable change in the bilateral adrenal adenomas. 10 mm in long axis left kidney  upper pole hypodense lesion, stable, likely a cyst. Stomach/Bowel: Sigmoid colon diverticulosis. Vascular/Lymphatic: No pathologic adenopathy in the abdomen or pelvis. Reproductive: Prostate gland 6.5 by 4.3 by 5.6 cm (volume = 82 cm^3). Curvilinear calcifications along the posterior periphery of the central gland. Other: Stranding in the central mesentery, unchanged from prior.  Musculoskeletal: Mild levoconvex lumbar scoliosis with multilevel degenerative facet arthropathy and mild degenerative disc disease, resulting an in left foraminal impingement at L5-S1 and L4-5; and a right foraminal impingement at L2-3 and L3-4. Probable central narrowing of the thecal sac at the L3-4 level. Small umbilical hernia contains adipose tissue and a small amount of fluid. IMPRESSION: 1. Stable CT appearance of the chest, abdomen, and pelvis, with stable mild bilateral axillary adenopathy and stable subxiphoid adenopathy. 2. Other imaging findings of potential clinical significance: Mild atherosclerotic calcification of the aortic arch. Diffuse hepatic steatosis. Cholelithiasis. Sigmoid diverticulosis. Enlarged prostate gland. Lumbar spondylosis and degenerative disc disease causing multilevel impingement. Chronic hazy stranding in the central mesentery. Electronically Signed   By: Van Clines M.D.   On: 12/19/2015 08:29   Ct Maxillofacial W Contrast  Result Date: 12/18/2015 CLINICAL DATA:  Follicular lymphoma.  Restaging. EXAM: CT MAXILLOFACIAL WITH CONTRAST TECHNIQUE: Multidetector CT imaging of the maxillofacial structures was performed with intravenous contrast. Multiplanar CT image reconstructions were also generated. A small metallic BB was placed on the right temple in order to reliably differentiate right from left. CONTRAST:  100 mL Isovue 370 COMPARISON:  06/18/2015 FINDINGS: The visualized portion of the brain is unremarkable. Prior right cataract extraction is noted. No orbital mass or inflammatory change is seen. The parotid and submandibular glands are unremarkable aside from a punctate left parotid calcification. No pharyngeal mass is seen. Partially visualized upper cervical lymph nodes are more fully evaluated on separate neck CT. No suspicious osseous lesions are identified. The paranasal sinuses and mastoid air cells are clear. IMPRESSION: 1. No orbital or facial mass. 2.  Cervical lymph nodes reported separately on neck CT. Electronically Signed   By: Logan Bores M.D.   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  Patient had recurrence in his right orbit. CT scan results from December 18, 2015 reviewed independently and reported as above again with minimal increase in several lymph nodes.  Although lymph nodes are nonpathologic, given his history will monitor closely. Return to clinic in 6 months with repeat laboratory work, imaging, and further evaluation.     Patient expressed understanding and was in agreement with this plan. He also understands that He can call clinic at any time with any questions, concerns, or complaints.    Lloyd Huger, MD   01/01/2016 8:28 AM

## 2015-12-27 ENCOUNTER — Other Ambulatory Visit: Payer: Medicare Other

## 2015-12-27 ENCOUNTER — Inpatient Hospital Stay (HOSPITAL_BASED_OUTPATIENT_CLINIC_OR_DEPARTMENT_OTHER): Payer: Medicare Other | Admitting: Oncology

## 2015-12-27 DIAGNOSIS — Z923 Personal history of irradiation: Secondary | ICD-10-CM

## 2015-12-27 DIAGNOSIS — Z9221 Personal history of antineoplastic chemotherapy: Secondary | ICD-10-CM

## 2015-12-27 DIAGNOSIS — K219 Gastro-esophageal reflux disease without esophagitis: Secondary | ICD-10-CM

## 2015-12-27 DIAGNOSIS — Z7982 Long term (current) use of aspirin: Secondary | ICD-10-CM

## 2015-12-27 DIAGNOSIS — C8211 Follicular lymphoma grade II, lymph nodes of head, face, and neck: Secondary | ICD-10-CM

## 2015-12-27 DIAGNOSIS — R221 Localized swelling, mass and lump, neck: Secondary | ICD-10-CM

## 2015-12-27 DIAGNOSIS — Z79899 Other long term (current) drug therapy: Secondary | ICD-10-CM

## 2015-12-27 DIAGNOSIS — R590 Localized enlarged lymph nodes: Secondary | ICD-10-CM

## 2015-12-27 DIAGNOSIS — G4730 Sleep apnea, unspecified: Secondary | ICD-10-CM

## 2015-12-27 NOTE — Progress Notes (Signed)
States is feeling well. Offers no complaints. 

## 2016-01-15 ENCOUNTER — Inpatient Hospital Stay: Payer: Medicare Other | Attending: Oncology

## 2016-01-15 VITALS — Resp 16

## 2016-01-15 DIAGNOSIS — Z452 Encounter for adjustment and management of vascular access device: Secondary | ICD-10-CM | POA: Insufficient documentation

## 2016-01-15 DIAGNOSIS — Z95828 Presence of other vascular implants and grafts: Secondary | ICD-10-CM

## 2016-01-15 DIAGNOSIS — Z9221 Personal history of antineoplastic chemotherapy: Secondary | ICD-10-CM | POA: Diagnosis not present

## 2016-01-15 DIAGNOSIS — Z923 Personal history of irradiation: Secondary | ICD-10-CM | POA: Diagnosis not present

## 2016-01-15 DIAGNOSIS — C8211 Follicular lymphoma grade II, lymph nodes of head, face, and neck: Secondary | ICD-10-CM | POA: Insufficient documentation

## 2016-01-15 MED ORDER — HEPARIN SOD (PORK) LOCK FLUSH 100 UNIT/ML IV SOLN
INTRAVENOUS | Status: AC
  Filled 2016-01-15: qty 5

## 2016-01-15 MED ORDER — SODIUM CHLORIDE 0.9% FLUSH
10.0000 mL | INTRAVENOUS | Status: DC | PRN
  Administered 2016-01-15: 10 mL via INTRAVENOUS
  Filled 2016-01-15: qty 10

## 2016-01-15 MED ORDER — HEPARIN SOD (PORK) LOCK FLUSH 100 UNIT/ML IV SOLN
500.0000 [IU] | Freq: Once | INTRAVENOUS | Status: AC
  Administered 2016-01-15: 500 [IU] via INTRAVENOUS

## 2016-02-20 ENCOUNTER — Ambulatory Visit: Payer: Medicare Other | Admitting: Radiation Oncology

## 2016-02-25 ENCOUNTER — Inpatient Hospital Stay: Payer: Medicare Other | Attending: Oncology

## 2016-02-25 VITALS — BP 108/74 | HR 74 | Resp 20

## 2016-02-25 DIAGNOSIS — Z95828 Presence of other vascular implants and grafts: Secondary | ICD-10-CM

## 2016-02-25 DIAGNOSIS — Z923 Personal history of irradiation: Secondary | ICD-10-CM | POA: Insufficient documentation

## 2016-02-25 DIAGNOSIS — C8211 Follicular lymphoma grade II, lymph nodes of head, face, and neck: Secondary | ICD-10-CM | POA: Diagnosis present

## 2016-02-25 DIAGNOSIS — Z9221 Personal history of antineoplastic chemotherapy: Secondary | ICD-10-CM | POA: Diagnosis not present

## 2016-02-25 DIAGNOSIS — Z452 Encounter for adjustment and management of vascular access device: Secondary | ICD-10-CM | POA: Insufficient documentation

## 2016-02-25 MED ORDER — HEPARIN SOD (PORK) LOCK FLUSH 100 UNIT/ML IV SOLN
INTRAVENOUS | Status: AC
  Filled 2016-02-25: qty 5

## 2016-02-25 MED ORDER — SODIUM CHLORIDE 0.9% FLUSH
10.0000 mL | INTRAVENOUS | Status: DC | PRN
  Administered 2016-02-25: 10 mL via INTRAVENOUS
  Filled 2016-02-25: qty 10

## 2016-02-25 MED ORDER — HEPARIN SOD (PORK) LOCK FLUSH 100 UNIT/ML IV SOLN
500.0000 [IU] | Freq: Once | INTRAVENOUS | Status: AC
  Administered 2016-02-25: 500 [IU] via INTRAVENOUS

## 2016-02-26 ENCOUNTER — Inpatient Hospital Stay: Payer: Medicare Other

## 2016-03-28 IMAGING — CT CT NECK W/ CM
2 of 3 series · 7 of 14 positions shown, 8 images · IV contrast (APPLIED)
Comparison: Neck CT 06/06/2014.  Face CT 12/13/2014.

CLINICAL DATA: 69-year-old male restaging recurrent follicular
lymphoma. Subsequent encounter.

EXAM:
CT NECK WITH CONTRAST
TECHNIQUE: Multidetector CT imaging of the neck was performed using the
standard protocol following the bolus administration of intravenous
contrast.
CONTRAST:  100mL OMNIPAQUE IOHEXOL 350 MG/ML SOLN in conjunction
with contrast enhanced imaging of the face, chest, abdomen, and
pelvis reported separately.

[Series 3: axial neck · axial · 0.95mm/px · z∈[-301,-181]mm · 3 of 122 slices shown]
[im 31/122  bone]
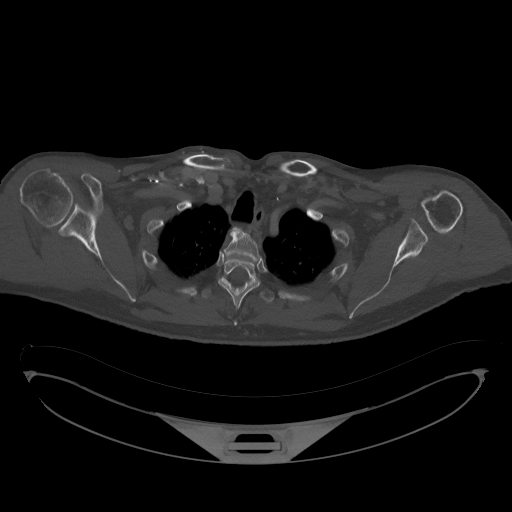
[im 61/122  bone]
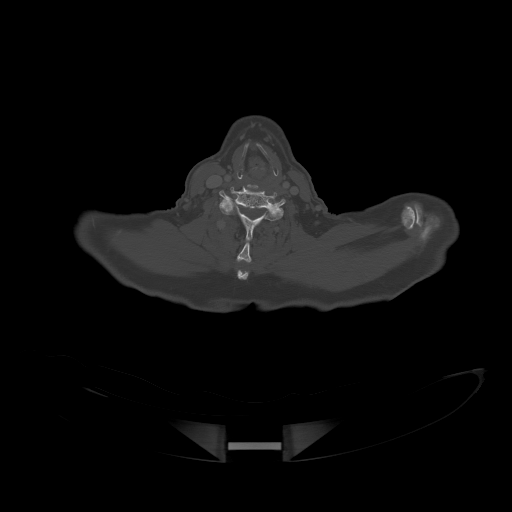
[im 91/122  bone]
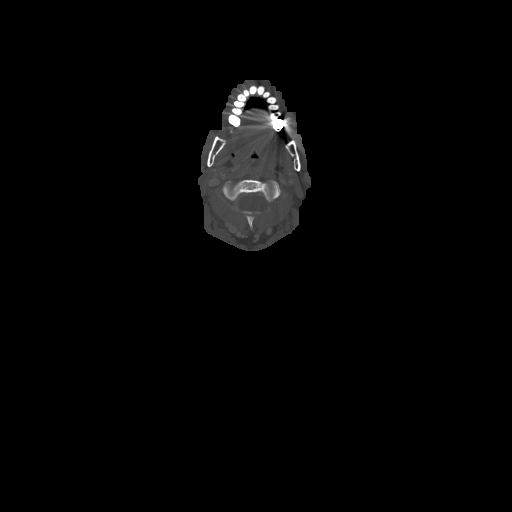

[Series 9: orthogonal ax · axial · 0.49mm/px · z∈[-356,-191]mm · 4 of 145 slices shown, 5 images]
[im 29/145  soft-tissue]
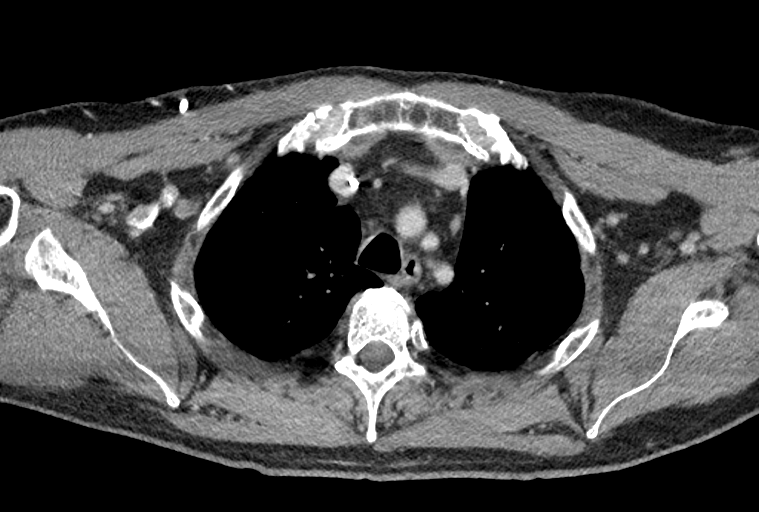
[im 29/145  bone]
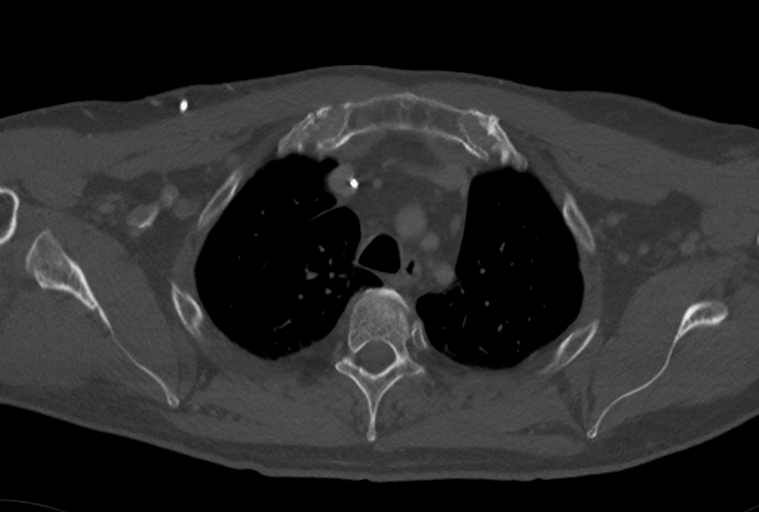
[im 58/145  bone]
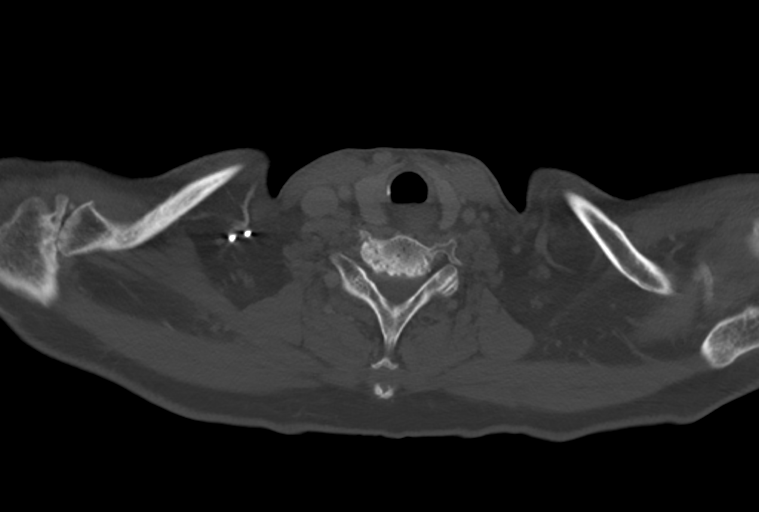
[im 87/145  bone]
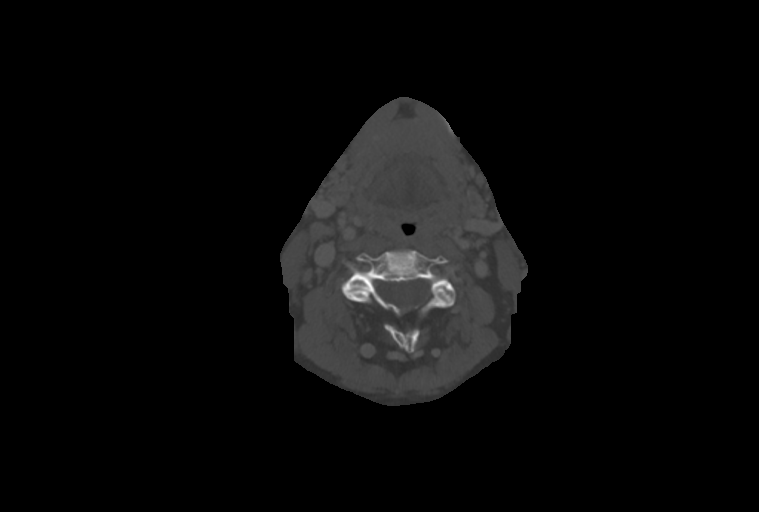
[im 116/145  bone]
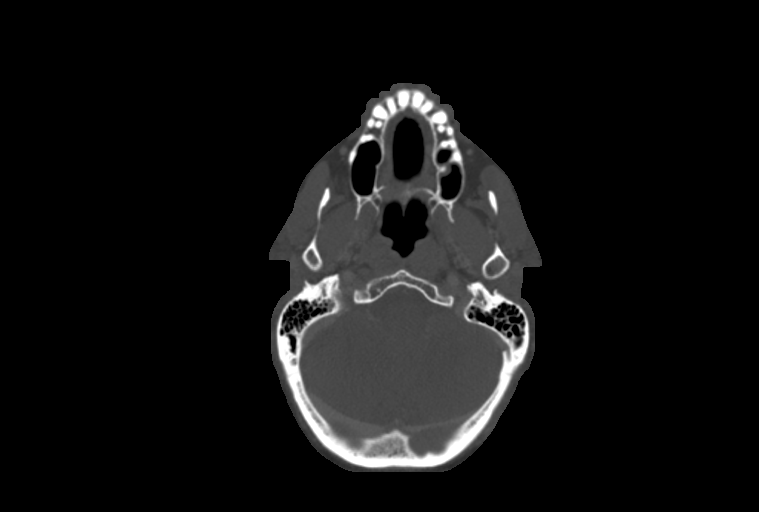

[7 of 14 positions shown; findings below may reference images not displayed]

FINDINGS: Pharynx and larynx: Laryngeal contour remains within normal limits,
the glottis is closed. The oropharynx is more effaced today, but
pharyngeal soft tissue contours remain within normal limits.
Negative parapharyngeal and retropharyngeal spaces.

Salivary glands: Sublingual space, submandibular glands, and parotid
glands are stable and within normal limits.

Thyroid: Negative.

Lymph nodes: Previously described right level III (series 3, image
70 today) and right level IV conspicuous but sub-centimeter lymph
nodes are stable since [REDACTED].

However, a right level IV/peritracheal node at the thoracic inlet on
series 3, image 90 now measures 9 mm short axis and was 4-5 mm in
[REDACTED].

Furthermore, two right side supraclavicular nodes not included on
the Santosini study are mildly increased compared [REDACTED] on series
3, image 67, up to 8 mm short axis. Similar increase in
contralateral left supraclavicular nodes since [REDACTED], now up to 9 mm
(previously 5 mm).

Also, there are several nonspecific posterior subcutaneous soft
tissue nodules with enhancement and spiculated margins, and that on
series 3, image 57 has increased in transaxial size since [DATE] x 23 mm versus 10-11 mm diameter previously).

A small right level V lymph node on series 3, image 50 also has
mildly increased since [REDACTED].

Sub-centimeter right level IIa and IIB lymph nodes also have
increased in size and number since [REDACTED] and since [DATE], image 46 today versus series 3, image [REDACTED].

By comparison left side level II nodes appear stable.

Vascular: Right chest porta cath remains in place. Major vascular
structures in the neck and at the skullbase are patent.

Limited intracranial: Negative.

Visualized orbits: Negative.

Mastoids and visualized paranasal sinuses: Stable and negative
paranasal sinuses.

Skeleton: Stable visualized osseous structures. No acute or
suspicious osseous lesion identified in the neck.

Upper chest: Reported separately today.
IMPRESSION: 1. Mildly increased right greater than left cervical lymph node size
and number since [REDACTED], and moderate increase since the prior
neck CT in June 2014, compatible with progression of lymphoma.
2. CT face, chest abdomen and pelvis reported separately today.

## 2016-04-07 ENCOUNTER — Inpatient Hospital Stay: Payer: Medicare Other | Attending: Oncology

## 2016-04-07 DIAGNOSIS — C8211 Follicular lymphoma grade II, lymph nodes of head, face, and neck: Secondary | ICD-10-CM | POA: Diagnosis not present

## 2016-04-07 DIAGNOSIS — Z923 Personal history of irradiation: Secondary | ICD-10-CM | POA: Diagnosis not present

## 2016-04-07 DIAGNOSIS — Z452 Encounter for adjustment and management of vascular access device: Secondary | ICD-10-CM | POA: Diagnosis not present

## 2016-04-07 DIAGNOSIS — Z9221 Personal history of antineoplastic chemotherapy: Secondary | ICD-10-CM | POA: Diagnosis not present

## 2016-04-07 DIAGNOSIS — Z95828 Presence of other vascular implants and grafts: Secondary | ICD-10-CM

## 2016-04-07 MED ORDER — SODIUM CHLORIDE 0.9% FLUSH
10.0000 mL | INTRAVENOUS | Status: DC | PRN
  Administered 2016-04-07: 10 mL via INTRAVENOUS
  Filled 2016-04-07: qty 10

## 2016-04-07 MED ORDER — HEPARIN SOD (PORK) LOCK FLUSH 100 UNIT/ML IV SOLN
500.0000 [IU] | Freq: Once | INTRAVENOUS | Status: AC
  Administered 2016-04-07: 500 [IU] via INTRAVENOUS

## 2016-04-08 ENCOUNTER — Other Ambulatory Visit: Payer: Self-pay | Admitting: *Deleted

## 2016-04-08 ENCOUNTER — Inpatient Hospital Stay: Payer: Medicare Other

## 2016-04-08 DIAGNOSIS — C8290 Follicular lymphoma, unspecified, unspecified site: Secondary | ICD-10-CM

## 2016-04-09 ENCOUNTER — Ambulatory Visit: Payer: Medicare Other | Admitting: Radiation Oncology

## 2016-04-30 ENCOUNTER — Encounter: Payer: Self-pay | Admitting: Radiation Oncology

## 2016-04-30 ENCOUNTER — Ambulatory Visit
Admission: RE | Admit: 2016-04-30 | Discharge: 2016-04-30 | Disposition: A | Discharge status: Home / Self Care | Payer: Medicare Other | Source: Ambulatory Visit | Attending: Radiation Oncology | Admitting: Radiation Oncology

## 2016-04-30 VITALS — BP 127/81 | HR 72 | Temp 98.1°F | Resp 20 | Wt 204.5 lb

## 2016-04-30 DIAGNOSIS — Z923 Personal history of irradiation: Secondary | ICD-10-CM | POA: Diagnosis not present

## 2016-04-30 DIAGNOSIS — Z8572 Personal history of non-Hodgkin lymphomas: Secondary | ICD-10-CM | POA: Diagnosis not present

## 2016-04-30 DIAGNOSIS — C8211 Follicular lymphoma grade II, lymph nodes of head, face, and neck: Secondary | ICD-10-CM

## 2016-04-30 NOTE — Progress Notes (Signed)
Radiation Oncology Follow up Note  Name: Sean Raymond   Date:   04/30/2016 MRN:  [id number] DOB: 07/09/1945    This 71 y.o. 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 He also been treated to his right orbit for lymphoma involvement. He is done well. Follow-up CT scans showed stable chest abdomen and pelvis. He specifically denies fever chills or night sweats or weight loss. He has no problems with vision in his right eye still remain somewhat red and he does use daily eyedrops..  COMPLICATIONS OF TREATMENT: none  FOLLOW UP COMPLIANCE: keeps appointments   PHYSICAL EXAM:  BP 127/81   Pulse 72   Temp 98.1 F (36.7 C)   Resp 20   Wt 204 lb 7.6 oz (92.8 kg)   BMI 28.52 kg/m  No peripheral adenopathy is identified. Right eye has extraocular movements intact. Fundi are benign. Well-developed well-nourished patient in NAD. HEENT reveals PERLA, EOMI, discs not visualized.  Oral cavity is clear. No oral mucosal lesions are identified. Neck is clear without evidence of cervical or supraclavicular adenopathy. Lungs are clear to A&P. Cardiac examination is essentially unremarkable with regular rate and rhythm without murmur rub or thrill. Abdomen is benign with no organomegaly or masses noted. Motor sensory and DTR levels are equal and symmetric in the upper and lower extremities. Cranial nerves II through XII are grossly intact. Proprioception is intact. No peripheral adenopathy or edema is identified. No motor or sensory levels are noted. Crude visual fields are within normal range.  RADIOLOGY RESULTS: Serial CT scans are reviewed  PLAN: Present time patient continues to do well with no evidence of disease. I'm going to turn  follow-up care over to medical oncology since he is being seen on a six-month basis. We briefly discussed treatment strategy should he develop recurrence. I told him I will reevaluate him at any time should he have any problems or concerns.  I would like to take this opportunity to thank you for allowing me to participate in the care of your patient.Armstead Peaks., MD

## 2016-05-26 ENCOUNTER — Inpatient Hospital Stay: Payer: Medicare Other | Attending: Oncology

## 2016-05-26 ENCOUNTER — Inpatient Hospital Stay: Payer: Medicare Other

## 2016-05-26 DIAGNOSIS — Z923 Personal history of irradiation: Secondary | ICD-10-CM | POA: Insufficient documentation

## 2016-05-26 DIAGNOSIS — Z95828 Presence of other vascular implants and grafts: Secondary | ICD-10-CM

## 2016-05-26 DIAGNOSIS — C8290 Follicular lymphoma, unspecified, unspecified site: Secondary | ICD-10-CM

## 2016-05-26 DIAGNOSIS — C8211 Follicular lymphoma grade II, lymph nodes of head, face, and neck: Secondary | ICD-10-CM | POA: Insufficient documentation

## 2016-05-26 DIAGNOSIS — Z9221 Personal history of antineoplastic chemotherapy: Secondary | ICD-10-CM | POA: Diagnosis not present

## 2016-05-26 LAB — COMPREHENSIVE METABOLIC PANEL
ALT: 19 U/L (ref 17–63)
AST: 23 U/L (ref 15–41)
Albumin: 4 g/dL (ref 3.5–5.0)
Alkaline Phosphatase: 74 U/L (ref 38–126)
Anion gap: 6 (ref 5–15)
BUN: 9 mg/dL (ref 6–20)
CO2: 28 mmol/L (ref 22–32)
Calcium: 8.5 mg/dL — ABNORMAL LOW (ref 8.9–10.3)
Chloride: 105 mmol/L (ref 101–111)
Creatinine, Ser: 0.9 mg/dL (ref 0.61–1.24)
GFR calc Af Amer: >60 mL/min (ref >60)
GFR calc non Af Amer: >60 mL/min (ref >60)
Glucose, Bld: 95 mg/dL (ref 65–99)
Potassium: 3.8 mmol/L (ref 3.5–5.1)
Sodium: 139 mmol/L (ref 135–145)
Total Bilirubin: 0.7 mg/dL (ref 0.3–1.2)
Total Protein: 6.2 g/dL — ABNORMAL LOW (ref 6.5–8.1)

## 2016-05-26 LAB — CBC WITH DIFFERENTIAL/PLATELET
Basophils Absolute: 0.1 10*3/uL (ref 0–0.1)
Basophils Relative: 2 %
Eosinophils Absolute: 0.1 10*3/uL (ref 0–0.7)
Eosinophils Relative: 2 %
HCT: 41.3 % (ref 40.0–52.0)
Hemoglobin: 13.8 g/dL (ref 13.0–18.0)
Lymphocytes Relative: 26 %
Lymphs Abs: 1.5 10*3/uL (ref 1.0–3.6)
MCH: 30.4 pg (ref 26.0–34.0)
MCHC: 33.5 g/dL (ref 32.0–36.0)
MCV: 90.6 fL (ref 80.0–100.0)
Monocytes Absolute: 0.6 10*3/uL (ref 0.2–1.0)
Monocytes Relative: 10 %
Neutro Abs: 3.4 10*3/uL (ref 1.4–6.5)
Neutrophils Relative %: 60 %
Platelets: 154 10*3/uL (ref 150–440)
RBC: 4.56 MIL/uL (ref 4.40–5.90)
RDW: 13 % (ref 11.5–14.5)
WBC: 5.7 10*3/uL (ref 3.8–10.6)

## 2016-05-26 LAB — LACTATE DEHYDROGENASE: LDH: 150 U/L (ref 98–192)

## 2016-05-26 MED ORDER — HEPARIN SOD (PORK) LOCK FLUSH 100 UNIT/ML IV SOLN
500.0000 [IU] | Freq: Once | INTRAVENOUS | Status: AC
  Administered 2016-05-26: 500 [IU] via INTRAVENOUS
  Filled 2016-05-26: qty 5

## 2016-05-26 MED ORDER — SODIUM CHLORIDE 0.9% FLUSH
10.0000 mL | INTRAVENOUS | Status: DC | PRN
  Administered 2016-05-26: 10 mL via INTRAVENOUS
  Filled 2016-05-26: qty 10

## 2016-06-30 NOTE — Progress Notes (Signed)
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  He currently feels well and is asymptomatic. The double vision in his right eye is chronic and unchanged.  He has no neurologic complaints.  He denies any fevers, weight loss, or night sweats.  He has noted no new lymphadenopathy.  He denies any chest pain or shortness of breath.  He denies any nausea, vomiting, constipation, or diarrhea.  He has no urinary complaints.  Patient offers no specific complaints today.  REVIEW OF SYSTEMS:   Review of Systems  Constitutional: Negative for diaphoresis, fever, malaise/fatigue and weight loss.  Eyes: Positive for double vision. Negative for blurred vision and pain.  Respiratory: Negative.  Negative for cough and shortness of breath.   Cardiovascular: Negative.  Negative for chest pain and leg swelling.  Gastrointestinal: Negative.  Negative for abdominal pain.  Genitourinary: Negative.   Musculoskeletal: Negative.   Neurological: Negative.  Negative for sensory change and weakness.  Psychiatric/Behavioral: Negative.  The patient is not nervous/anxious.     As per HPI. Otherwise, a complete review of systems is negative.  PAST MEDICAL HISTORY: Past Medical History:  Diagnosis Date  . Follicular lymphoma ([place]) 2009   Patient had chemo + rad tx's and zevaline shots.  Marland Kitchen GERD (gastroesophageal reflux disease)   . Hemorrhoids   . Sleep apnea     PAST SURGICAL HISTORY: Hemorrhoidectomy.  FAMILY HISTORY: Colon cancer and lung cancer.     ADVANCED DIRECTIVES:    HEALTH MAINTENANCE: Social History  Substance  Use Topics  . Smoking status: Never Smoker  . Smokeless tobacco: Never Used  . Alcohol use Yes     Comment: occasional     No Known Allergies  Current Outpatient Prescriptions  Medication Sig Dispense Refill  . amLODipine (NORVASC) 5 MG tablet Take 5 mg by mouth daily.     Marland Kitchen aspirin EC 81 MG tablet Take 81 mg by mouth daily.     Marland Kitchen atorvastatin (LIPITOR) 10 MG tablet Take 10 mg by mouth daily at 6 PM.     . Calcium Carbonate-Vitamin D 600-400 MG-UNIT per tablet Take 1 tablet by mouth 2 (two) times daily.     . cetirizine (ZYRTEC) 10 MG tablet Take 10 mg by mouth daily.    . Multiple Vitamin (MULTI-VITAMINS) TABS Take 1 tablet by mouth daily.     Marland Kitchen omeprazole (PRILOSEC) 40 MG capsule Take 40 mg by mouth daily.     . saw palmetto 500 MG capsule Take 500 mg by mouth daily.    . sildenafil (VIAGRA) 100 MG tablet Take 100 mg by mouth as needed.      No current facility-administered medications for this visit.     OBJECTIVE: Vitals:   07/03/16 1100  BP: 121/80  Pulse: 71  Temp: 97.8 F (36.6 C)     Body mass index is 29.36 kg/m.    ECOG FS:0 - Asymptomatic  General: Well-developed, well-nourished, no acute distress. Eyes: anicteric sclera. Neck: No palpable lymphadenopathy. Lungs: Clear to auscultation bilaterally. Heart: Regular rate and rhythm. No rubs, murmurs, or gallops. Abdomen: Soft, nontender, nondistended. No organomegaly noted, normoactive bowel sounds. Musculoskeletal: No edema,  cyanosis, or clubbing. Neuro: Alert, answering all questions appropriately. Cranial nerves grossly intact. Skin: No erythema or rash Psych: Normal affect. Lymphatics: No cervical, calvicular, axillary or inguinal LAD.   LAB RESULTS:  Lab Results  Component Value Date   NA 139 05/26/2016   K 3.8 05/26/2016   CL 105 05/26/2016   CO2 28 05/26/2016   GLUCOSE 95 05/26/2016   BUN 9 05/26/2016   CREATININE 0.90 05/26/2016   CALCIUM 8.5 (L) 05/26/2016   PROT 6.2 (L) 05/26/2016    ALBUMIN 4.0 05/26/2016   AST 23 05/26/2016   ALT 19 05/26/2016   ALKPHOS 74 05/26/2016   BILITOT 0.7 05/26/2016   GFRNONAA >60 05/26/2016   GFRAA >60 05/26/2016    Lab Results  Component Value Date   WBC 5.7 05/26/2016   NEUTROABS 3.4 05/26/2016   HGB 13.8 05/26/2016   HCT 41.3 05/26/2016   MCV 90.6 05/26/2016   PLT 154 05/26/2016     STUDIES: Ct Soft Tissue Neck W Contrast  Result Date: 07/01/2016 CLINICAL DATA:  Follicular lymphoma of the right orbit in remission. Previous forehead recurrence treated with chemotherapy and radiation. EXAM: CT NECK WITH CONTRAST TECHNIQUE: Multidetector CT imaging of the neck was performed using the standard protocol following the bolus administration of intravenous contrast. CONTRAST:  125mL ISOVUE-300 IOPAMIDOL (ISOVUE-300) INJECTION 61% COMPARISON:  12/18/2015 FINDINGS: Pharynx and larynx: No mucosal or submucosal lesion is seen. Salivary glands: Parotid and submandibular glands are normal. Thyroid: Normal Lymph nodes: Multiple lymph nodes on both sides of the neck are compared to the previous study. Many of the nodes show slight increase in size. This slight increase is nonspecific and could represent recurrence of disease or could represent reactive nodal enlargement. For example, level 2 node on the right image 44 has increased in size from short axis diameter of 9.2 mm to 12 mm. Right level 5 a nodes adjacent to 1 another have increased from 8 and 10 mm to 10 and 10 mm. On the left, 5 a node image 57 has increased from 6 mm to 8 mm. More posterior level 5 nodes have increased from 8 mm to 12 mm. Vascular: Arterial and venous structures are patent. Limited intracranial: Normal Visualized orbits: Negative.  See facial exam. Mastoids and visualized paranasal sinuses: Clear Skeleton: Ordinary degenerative changes of the spine. No bony destructive process. Upper chest: See results of chest CT. Other: Subcutaneous density at the right posterior neck/ upper  back image 58 has decreased from 13 x 21 mm to 11 x 18 mm. IMPRESSION: Slight enlargement of lymph nodes on both sides of the neck. Short axis diameter of the nodes in many locations increased by 2-3 mm. This does raise the possibility of recurrent disease, though this degree of nodal enlargement could be benign and reactive. Electronically Signed   By: Nelson Chimes M.D.   On: 07/01/2016 13:36   Ct Chest W Contrast  Result Date: 07/01/2016 CLINICAL DATA:  Follow up follicular lymphoma, initially diagnosed within the right orbit as grade 2 in 2009. Recurrence in the forehead in 2014. Currently in remission. EXAM: CT CHEST, ABDOMEN, AND PELVIS WITH CONTRAST TECHNIQUE: Multidetector CT imaging of the chest, abdomen and pelvis was performed following the standard protocol during bolus administration of intravenous contrast. CONTRAST:  124mL ISOVUE-300 IOPAMIDOL (ISOVUE-300) INJECTION 61% COMPARISON:  CT 12/18/2015 and 06/18/2015. FINDINGS: CT CHEST FINDINGS Cardiovascular: No acute vascular findings. There is mild atherosclerosis of the aorta, great vessels and coronary arteries. Right subclavian Port-A-Cath tip  extends to the superior cavoatrial junction. There are few chronic collateral vessels in the upper right chest wall. The heart size is normal. There is no pericardial effusion. Mediastinum/Nodes: Stable mildly prominent axillary lymph nodes bilaterally, measuring up to 10 mm on the left (image number 15). There are no enlarged mediastinal, hilar or internal mammary lymph nodes. There are stable small nodules inferior to the xiphoid process (image 47). The thyroid gland, trachea and esophagus demonstrate no significant findings. Lungs/Pleura: There is no pleural effusion. No acute or worrisome pulmonary findings. Azygos fissure and 3 mm left upper lobe nodule (image 28) are unchanged. There is chronic dependent atelectasis or scarring in both lower lobes, worse on the left. Musculoskeletal/Chest wall: No  chest wall mass or suspicious osseous findings. CT ABDOMEN AND PELVIS FINDINGS Hepatobiliary: Probable hepatic steatosis as before. No focal abnormalities are seen. There is a small dependent calcified gallstone in the gallbladder neck which appears unchanged. No evidence of gallbladder wall thickening or biliary dilatation. Pancreas: Unremarkable. No pancreatic ductal dilatation or surrounding inflammatory changes. Spleen: Normal in size without focal abnormality. Adrenals/Urinary Tract: Stable bilateral adrenal nodules, likely adenomas based on stability. The kidneys appear stable with a small cyst in the upper pole of the left kidney and bilateral extrarenal pelvis. No evidence of urinary tract calculus or hydronephrosis. The bladder appears unchanged. Stomach/Bowel: No evidence of bowel wall thickening, distention or surrounding inflammatory change. There are diverticular changes throughout the sigmoid colon. The appendix appears normal. Vascular/Lymphatic: There are no enlarged abdominal or pelvic lymph nodes. Mild aortic atherosclerosis. Reproductive: There is stable moderate enlargement of the prostate gland with median lobe hypertrophy extending into the bladder base. There are prostatic dystrophic calcifications. Other: Stable soft tissue stranding throughout the mesenteric fat. No ascites. Musculoskeletal: No acute or significant osseous findings. Stable lumbar spondylosis and mild scoliosis. IMPRESSION: 1. Continued stability compared with previous studies. 2. There are small lymph nodes in both axilla and in the subxiphoid area. No progressive adenopathy. 3. No acute findings. 4. Stable incidental findings including chronic lung disease, cholelithiasis, sigmoid diverticulosis and bilateral adrenal adenomas. Electronically Signed   By: Richardean Sale M.D.   On: 07/01/2016 14:13   Ct Abdomen Pelvis W Contrast  Result Date: 07/01/2016 CLINICAL DATA:  Follow up follicular lymphoma, initially diagnosed  within the right orbit as grade 2 in 2009. Recurrence in the forehead in 2014. Currently in remission. EXAM: CT CHEST, ABDOMEN, AND PELVIS WITH CONTRAST TECHNIQUE: Multidetector CT imaging of the chest, abdomen and pelvis was performed following the standard protocol during bolus administration of intravenous contrast. CONTRAST:  176mL ISOVUE-300 IOPAMIDOL (ISOVUE-300) INJECTION 61% COMPARISON:  CT 12/18/2015 and 06/18/2015. FINDINGS: CT CHEST FINDINGS Cardiovascular: No acute vascular findings. There is mild atherosclerosis of the aorta, great vessels and coronary arteries. Right subclavian Port-A-Cath tip extends to the superior cavoatrial junction. There are few chronic collateral vessels in the upper right chest wall. The heart size is normal. There is no pericardial effusion. Mediastinum/Nodes: Stable mildly prominent axillary lymph nodes bilaterally, measuring up to 10 mm on the left (image number 15). There are no enlarged mediastinal, hilar or internal mammary lymph nodes. There are stable small nodules inferior to the xiphoid process (image 47). The thyroid gland, trachea and esophagus demonstrate no significant findings. Lungs/Pleura: There is no pleural effusion. No acute or worrisome pulmonary findings. Azygos fissure and 3 mm left upper lobe nodule (image 28) are unchanged. There is chronic dependent atelectasis or scarring in both lower lobes, worse on the  left. Musculoskeletal/Chest wall: No chest wall mass or suspicious osseous findings. CT ABDOMEN AND PELVIS FINDINGS Hepatobiliary: Probable hepatic steatosis as before. No focal abnormalities are seen. There is a small dependent calcified gallstone in the gallbladder neck which appears unchanged. No evidence of gallbladder wall thickening or biliary dilatation. Pancreas: Unremarkable. No pancreatic ductal dilatation or surrounding inflammatory changes. Spleen: Normal in size without focal abnormality. Adrenals/Urinary Tract: Stable bilateral adrenal  nodules, likely adenomas based on stability. The kidneys appear stable with a small cyst in the upper pole of the left kidney and bilateral extrarenal pelvis. No evidence of urinary tract calculus or hydronephrosis. The bladder appears unchanged. Stomach/Bowel: No evidence of bowel wall thickening, distention or surrounding inflammatory change. There are diverticular changes throughout the sigmoid colon. The appendix appears normal. Vascular/Lymphatic: There are no enlarged abdominal or pelvic lymph nodes. Mild aortic atherosclerosis. Reproductive: There is stable moderate enlargement of the prostate gland with median lobe hypertrophy extending into the bladder base. There are prostatic dystrophic calcifications. Other: Stable soft tissue stranding throughout the mesenteric fat. No ascites. Musculoskeletal: No acute or significant osseous findings. Stable lumbar spondylosis and mild scoliosis. IMPRESSION: 1. Continued stability compared with previous studies. 2. There are small lymph nodes in both axilla and in the subxiphoid area. No progressive adenopathy. 3. No acute findings. 4. Stable incidental findings including chronic lung disease, cholelithiasis, sigmoid diverticulosis and bilateral adrenal adenomas. Electronically Signed   By: Richardean Sale M.D.   On: 07/01/2016 14:13   Ct Maxillofacial W Contrast  Result Date: 07/01/2016 CLINICAL DATA:  Follicular lymphoma of the right orbit previously diagnosed, currently in remission. EXAM: CT MAXILLOFACIAL WITH CONTRAST TECHNIQUE: Standard technique during intravenous administration of 125 cc Isovue-300 COMPARISON:  12/18/2015 FINDINGS: Osseous: No osseous lesion in the region imaged. Orbits: Globes are normal. Optic nerves are normal. Extra-ocular muscles are normal. Intraorbital fat is normal. Lacrimal glands are normal. Vascular structures are normal. No soft tissue mass. Sinuses: Clear Soft tissues: No definable facial soft tissue lesion. Limited  intracranial: Negative IMPRESSION: No evidence of facial or orbital recurrence. Electronically Signed   By: Nelson Chimes M.D.   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  Patient had recurrence in his right orbit. CT scan results from July 01, 2016 reviewed independently and reported as above again with minimal increase in several lymph nodes.  Although lymph nodes are nonpathologic, given his history will monitor closely. Return to clinic in 6 months with repeat laboratory work, imaging, and further evaluation.    Approximately 20 minutes was spent in discussion of which greater than 50% was consultation.   Patient expressed understanding and was in agreement with this plan. He also understands that He can call clinic at any time with any questions, concerns, or complaints.    Lloyd Huger, MD   07/03/2016 12:56 PM

## 2016-07-01 ENCOUNTER — Ambulatory Visit
Admission: RE | Admit: 2016-07-01 | Discharge: 2016-07-01 | Disposition: A | Discharge status: Home / Self Care | Payer: Medicare Other | Source: Ambulatory Visit | Attending: Oncology | Admitting: Oncology

## 2016-07-01 ENCOUNTER — Ambulatory Visit: Payer: Medicare Other

## 2016-07-01 DIAGNOSIS — D3501 Benign neoplasm of right adrenal gland: Secondary | ICD-10-CM | POA: Insufficient documentation

## 2016-07-01 DIAGNOSIS — C8211 Follicular lymphoma grade II, lymph nodes of head, face, and neck: Secondary | ICD-10-CM | POA: Diagnosis present

## 2016-07-01 DIAGNOSIS — K8020 Calculus of gallbladder without cholecystitis without obstruction: Secondary | ICD-10-CM | POA: Insufficient documentation

## 2016-07-01 DIAGNOSIS — K5730 Diverticulosis of large intestine without perforation or abscess without bleeding: Secondary | ICD-10-CM | POA: Insufficient documentation

## 2016-07-01 MED ORDER — IOPAMIDOL (ISOVUE-300) INJECTION 61%
125.0000 mL | Freq: Once | INTRAVENOUS | Status: AC | PRN
  Administered 2016-07-01: 125 mL via INTRAVENOUS

## 2016-07-03 ENCOUNTER — Inpatient Hospital Stay: Payer: Medicare Other | Attending: Oncology | Admitting: Oncology

## 2016-07-03 VITALS — BP 121/80 | HR 71 | Temp 97.8°F | Wt 210.5 lb

## 2016-07-03 DIAGNOSIS — Z79899 Other long term (current) drug therapy: Secondary | ICD-10-CM | POA: Diagnosis not present

## 2016-07-03 DIAGNOSIS — Z9221 Personal history of antineoplastic chemotherapy: Secondary | ICD-10-CM | POA: Insufficient documentation

## 2016-07-03 DIAGNOSIS — Z800 Family history of malignant neoplasm of digestive organs: Secondary | ICD-10-CM | POA: Diagnosis not present

## 2016-07-03 DIAGNOSIS — R590 Localized enlarged lymph nodes: Secondary | ICD-10-CM | POA: Insufficient documentation

## 2016-07-03 DIAGNOSIS — Z801 Family history of malignant neoplasm of trachea, bronchus and lung: Secondary | ICD-10-CM | POA: Insufficient documentation

## 2016-07-03 DIAGNOSIS — C8211 Follicular lymphoma grade II, lymph nodes of head, face, and neck: Secondary | ICD-10-CM | POA: Insufficient documentation

## 2016-07-03 DIAGNOSIS — G4730 Sleep apnea, unspecified: Secondary | ICD-10-CM | POA: Insufficient documentation

## 2016-07-03 DIAGNOSIS — M47816 Spondylosis without myelopathy or radiculopathy, lumbar region: Secondary | ICD-10-CM | POA: Insufficient documentation

## 2016-07-03 DIAGNOSIS — R911 Solitary pulmonary nodule: Secondary | ICD-10-CM | POA: Diagnosis not present

## 2016-07-03 DIAGNOSIS — D3502 Benign neoplasm of left adrenal gland: Secondary | ICD-10-CM | POA: Insufficient documentation

## 2016-07-03 DIAGNOSIS — K8020 Calculus of gallbladder without cholecystitis without obstruction: Secondary | ICD-10-CM | POA: Diagnosis not present

## 2016-07-03 DIAGNOSIS — K5730 Diverticulosis of large intestine without perforation or abscess without bleeding: Secondary | ICD-10-CM | POA: Diagnosis not present

## 2016-07-03 DIAGNOSIS — K219 Gastro-esophageal reflux disease without esophagitis: Secondary | ICD-10-CM | POA: Insufficient documentation

## 2016-07-03 DIAGNOSIS — J45909 Unspecified asthma, uncomplicated: Secondary | ICD-10-CM | POA: Insufficient documentation

## 2016-07-03 DIAGNOSIS — Z7982 Long term (current) use of aspirin: Secondary | ICD-10-CM | POA: Diagnosis not present

## 2016-07-03 DIAGNOSIS — Z923 Personal history of irradiation: Secondary | ICD-10-CM | POA: Insufficient documentation

## 2016-07-03 DIAGNOSIS — I700 Atherosclerosis of aorta: Secondary | ICD-10-CM | POA: Insufficient documentation

## 2016-07-03 DIAGNOSIS — D3501 Benign neoplasm of right adrenal gland: Secondary | ICD-10-CM | POA: Diagnosis not present

## 2016-07-03 DIAGNOSIS — H532 Diplopia: Secondary | ICD-10-CM | POA: Diagnosis not present

## 2016-07-03 NOTE — Progress Notes (Signed)
Patient presents today for CT results no questions or concerns.

## 2016-07-14 ENCOUNTER — Inpatient Hospital Stay: Payer: Medicare Other | Attending: Oncology

## 2016-07-14 DIAGNOSIS — C8211 Follicular lymphoma grade II, lymph nodes of head, face, and neck: Secondary | ICD-10-CM | POA: Diagnosis not present

## 2016-07-14 DIAGNOSIS — Z923 Personal history of irradiation: Secondary | ICD-10-CM | POA: Insufficient documentation

## 2016-07-14 DIAGNOSIS — Z452 Encounter for adjustment and management of vascular access device: Secondary | ICD-10-CM | POA: Diagnosis not present

## 2016-07-14 DIAGNOSIS — Z9221 Personal history of antineoplastic chemotherapy: Secondary | ICD-10-CM | POA: Insufficient documentation

## 2016-07-14 DIAGNOSIS — Z95828 Presence of other vascular implants and grafts: Secondary | ICD-10-CM

## 2016-07-14 MED ORDER — HEPARIN SOD (PORK) LOCK FLUSH 100 UNIT/ML IV SOLN
INTRAVENOUS | Status: AC
  Filled 2016-07-14: qty 5

## 2016-07-14 MED ORDER — HEPARIN SOD (PORK) LOCK FLUSH 100 UNIT/ML IV SOLN
500.0000 [IU] | Freq: Once | INTRAVENOUS | Status: AC
  Administered 2016-07-14: 500 [IU] via INTRAVENOUS

## 2016-07-14 MED ORDER — SODIUM CHLORIDE 0.9% FLUSH
10.0000 mL | INTRAVENOUS | Status: DC | PRN
  Administered 2016-07-14: 10 mL via INTRAVENOUS
  Filled 2016-07-14: qty 10

## 2016-09-01 ENCOUNTER — Inpatient Hospital Stay: Payer: Medicare Other | Attending: Oncology

## 2016-09-01 DIAGNOSIS — Z923 Personal history of irradiation: Secondary | ICD-10-CM | POA: Diagnosis not present

## 2016-09-01 DIAGNOSIS — Z9221 Personal history of antineoplastic chemotherapy: Secondary | ICD-10-CM | POA: Insufficient documentation

## 2016-09-01 DIAGNOSIS — Z452 Encounter for adjustment and management of vascular access device: Secondary | ICD-10-CM | POA: Diagnosis not present

## 2016-09-01 DIAGNOSIS — C8211 Follicular lymphoma grade II, lymph nodes of head, face, and neck: Secondary | ICD-10-CM | POA: Insufficient documentation

## 2016-09-01 DIAGNOSIS — Z95828 Presence of other vascular implants and grafts: Secondary | ICD-10-CM

## 2016-09-01 MED ORDER — HEPARIN SOD (PORK) LOCK FLUSH 100 UNIT/ML IV SOLN
500.0000 [IU] | Freq: Once | INTRAVENOUS | Status: AC
  Administered 2016-09-01: 500 [IU] via INTRAVENOUS

## 2016-09-01 MED ORDER — SODIUM CHLORIDE 0.9% FLUSH
10.0000 mL | INTRAVENOUS | Status: DC | PRN
  Administered 2016-09-01: 10 mL via INTRAVENOUS
  Filled 2016-09-01: qty 10

## 2016-10-19 ENCOUNTER — Inpatient Hospital Stay: Payer: Medicare Other | Attending: Oncology

## 2016-10-19 DIAGNOSIS — Z923 Personal history of irradiation: Secondary | ICD-10-CM | POA: Insufficient documentation

## 2016-10-19 DIAGNOSIS — Z9221 Personal history of antineoplastic chemotherapy: Secondary | ICD-10-CM | POA: Diagnosis not present

## 2016-10-19 DIAGNOSIS — Z452 Encounter for adjustment and management of vascular access device: Secondary | ICD-10-CM | POA: Diagnosis not present

## 2016-10-19 DIAGNOSIS — C8211 Follicular lymphoma grade II, lymph nodes of head, face, and neck: Secondary | ICD-10-CM | POA: Insufficient documentation

## 2016-10-19 DIAGNOSIS — Z95828 Presence of other vascular implants and grafts: Secondary | ICD-10-CM

## 2016-10-19 MED ORDER — SODIUM CHLORIDE 0.9% FLUSH
10.0000 mL | INTRAVENOUS | Status: AC | PRN
  Administered 2016-10-19: 10 mL via INTRAVENOUS
  Filled 2016-10-19: qty 10

## 2016-10-19 MED ORDER — HEPARIN SOD (PORK) LOCK FLUSH 100 UNIT/ML IV SOLN
500.0000 [IU] | Freq: Once | INTRAVENOUS | Status: AC
  Administered 2016-10-19: 500 [IU] via INTRAVENOUS

## 2016-10-20 ENCOUNTER — Inpatient Hospital Stay: Payer: Medicare Other

## 2016-12-08 ENCOUNTER — Inpatient Hospital Stay: Payer: Medicare Other

## 2016-12-11 ENCOUNTER — Other Ambulatory Visit: Payer: Self-pay | Admitting: Oncology

## 2016-12-11 ENCOUNTER — Inpatient Hospital Stay: Payer: Medicare Other

## 2016-12-11 ENCOUNTER — Inpatient Hospital Stay: Payer: Medicare Other | Attending: Oncology

## 2016-12-11 DIAGNOSIS — Z923 Personal history of irradiation: Secondary | ICD-10-CM | POA: Diagnosis not present

## 2016-12-11 DIAGNOSIS — H532 Diplopia: Secondary | ICD-10-CM | POA: Insufficient documentation

## 2016-12-11 DIAGNOSIS — K219 Gastro-esophageal reflux disease without esophagitis: Secondary | ICD-10-CM | POA: Insufficient documentation

## 2016-12-11 DIAGNOSIS — Z95828 Presence of other vascular implants and grafts: Secondary | ICD-10-CM

## 2016-12-11 DIAGNOSIS — G4730 Sleep apnea, unspecified: Secondary | ICD-10-CM | POA: Insufficient documentation

## 2016-12-11 DIAGNOSIS — Z7982 Long term (current) use of aspirin: Secondary | ICD-10-CM | POA: Insufficient documentation

## 2016-12-11 DIAGNOSIS — Z79899 Other long term (current) drug therapy: Secondary | ICD-10-CM | POA: Diagnosis not present

## 2016-12-11 DIAGNOSIS — C8290 Follicular lymphoma, unspecified, unspecified site: Secondary | ICD-10-CM

## 2016-12-11 DIAGNOSIS — N400 Enlarged prostate without lower urinary tract symptoms: Secondary | ICD-10-CM | POA: Insufficient documentation

## 2016-12-11 DIAGNOSIS — Z800 Family history of malignant neoplasm of digestive organs: Secondary | ICD-10-CM | POA: Diagnosis not present

## 2016-12-11 DIAGNOSIS — C8211 Follicular lymphoma grade II, lymph nodes of head, face, and neck: Secondary | ICD-10-CM | POA: Diagnosis not present

## 2016-12-11 DIAGNOSIS — Z9221 Personal history of antineoplastic chemotherapy: Secondary | ICD-10-CM | POA: Insufficient documentation

## 2016-12-11 DIAGNOSIS — R918 Other nonspecific abnormal finding of lung field: Secondary | ICD-10-CM | POA: Diagnosis not present

## 2016-12-11 DIAGNOSIS — Z801 Family history of malignant neoplasm of trachea, bronchus and lung: Secondary | ICD-10-CM | POA: Diagnosis not present

## 2016-12-11 LAB — COMPREHENSIVE METABOLIC PANEL
ALK PHOS: 65 U/L (ref 38–126)
ALT: 21 U/L (ref 17–63)
AST: 25 U/L (ref 15–41)
Albumin: 4.1 g/dL (ref 3.5–5.0)
Anion gap: 6 (ref 5–15)
BUN: <5 mg/dL — ABNORMAL LOW (ref 6–20)
CALCIUM: 9 mg/dL (ref 8.9–10.3)
CHLORIDE: 105 mmol/L (ref 101–111)
CO2: 27 mmol/L (ref 22–32)
CREATININE: 0.95 mg/dL (ref 0.61–1.24)
Glucose, Bld: 99 mg/dL (ref 65–99)
Potassium: 3.7 mmol/L (ref 3.5–5.1)
SODIUM: 138 mmol/L (ref 135–145)
Total Bilirubin: 1.3 mg/dL — ABNORMAL HIGH (ref 0.3–1.2)
Total Protein: 6.7 g/dL (ref 6.5–8.1)

## 2016-12-11 LAB — CBC WITH DIFFERENTIAL/PLATELET
BASOS PCT: 1 %
Basophils Absolute: 0.1 10*3/uL (ref 0–0.1)
EOS ABS: 0.1 10*3/uL (ref 0–0.7)
EOS PCT: 1 %
HCT: 42 % (ref 40.0–52.0)
Hemoglobin: 14.3 g/dL (ref 13.0–18.0)
Lymphocytes Relative: 24 %
Lymphs Abs: 1.5 10*3/uL (ref 1.0–3.6)
MCH: 30.6 pg (ref 26.0–34.0)
MCHC: 34.1 g/dL (ref 32.0–36.0)
MCV: 89.8 fL (ref 80.0–100.0)
MONO ABS: 0.6 10*3/uL (ref 0.2–1.0)
MONOS PCT: 9 %
Neutro Abs: 4.2 10*3/uL (ref 1.4–6.5)
Neutrophils Relative %: 65 %
Platelets: 169 10*3/uL (ref 150–440)
RBC: 4.68 MIL/uL (ref 4.40–5.90)
RDW: 13.2 % (ref 11.5–14.5)
WBC: 6.4 10*3/uL (ref 3.8–10.6)

## 2016-12-11 LAB — LACTATE DEHYDROGENASE: LDH: 142 U/L (ref 98–192)

## 2016-12-11 MED ORDER — HEPARIN SOD (PORK) LOCK FLUSH 100 UNIT/ML IV SOLN
500.0000 [IU] | Freq: Once | INTRAVENOUS | Status: AC
  Administered 2016-12-11: 500 [IU] via INTRAVENOUS
  Filled 2016-12-11: qty 5

## 2016-12-11 MED ORDER — SODIUM CHLORIDE 0.9% FLUSH
10.0000 mL | INTRAVENOUS | Status: DC | PRN
  Administered 2016-12-11: 10 mL via INTRAVENOUS
  Filled 2016-12-11: qty 10

## 2016-12-21 ENCOUNTER — Ambulatory Visit
Admission: RE | Admit: 2016-12-21 | Discharge: 2016-12-21 | Disposition: A | Discharge status: Home / Self Care | Payer: Medicare Other | Source: Ambulatory Visit | Attending: Oncology | Admitting: Oncology

## 2016-12-21 DIAGNOSIS — E279 Disorder of adrenal gland, unspecified: Secondary | ICD-10-CM | POA: Insufficient documentation

## 2016-12-21 DIAGNOSIS — C8211 Follicular lymphoma grade II, lymph nodes of head, face, and neck: Secondary | ICD-10-CM | POA: Diagnosis not present

## 2016-12-21 DIAGNOSIS — K118 Other diseases of salivary glands: Secondary | ICD-10-CM | POA: Diagnosis not present

## 2016-12-21 MED ORDER — IOPAMIDOL (ISOVUE-370) INJECTION 76%
100.0000 mL | Freq: Once | INTRAVENOUS | Status: AC | PRN
  Administered 2016-12-21: 100 mL via INTRAVENOUS

## 2016-12-22 NOTE — Progress Notes (Signed)
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 He currently feels well and is asymptomatic. The double vision in his right eye is chronic and unchanged.  He has no neurologic complaints.  He denies any fevers, weight loss, or night sweats.  He has noted no new lymphadenopathy.  He denies any chest pain or shortness of breath.  He denies any nausea, vomiting, constipation, or diarrhea.  He has no urinary complaints.  Patient offers no specific complaints today.  REVIEW OF SYSTEMS:   Review of Systems  Constitutional: Negative for diaphoresis, fever, malaise/fatigue and weight loss.  Eyes: Positive for double vision. Negative for blurred vision and pain.  Respiratory: Negative.  Negative for cough and shortness of breath.   Cardiovascular: Negative.  Negative for chest pain and leg swelling.  Gastrointestinal: Negative.  Negative for abdominal pain.  Genitourinary: Negative.   Musculoskeletal: Negative.   Neurological: Negative.  Negative for sensory change and weakness.  Psychiatric/Behavioral: Negative.  The patient is not nervous/anxious.     As per HPI. Otherwise, a complete review of systems is negative.  PAST MEDICAL HISTORY: Past Medical History:  Diagnosis Date  . Follicular lymphoma ([place]) 2009   Patient had chemo + rad tx's and zevaline shots.  Marland Kitchen GERD (gastroesophageal reflux disease)   . Hemorrhoids   . Sleep apnea     PAST SURGICAL HISTORY: Hemorrhoidectomy.  FAMILY HISTORY: Colon cancer and lung cancer.     ADVANCED DIRECTIVES:    HEALTH MAINTENANCE: Social History  Substance  Use Topics  . Smoking status: Never Smoker  . Smokeless tobacco: Never Used  . Alcohol use Yes     Comment: occasional     No Known Allergies  Current Outpatient Prescriptions  Medication Sig Dispense Refill  . amLODipine (NORVASC) 5 MG tablet Take 5 mg by mouth daily.     Marland Kitchen aspirin EC 81 MG tablet Take 81 mg by mouth daily.     Marland Kitchen atorvastatin (LIPITOR) 10 MG tablet Take 10 mg by mouth daily at 6 PM.     . Calcium Carbonate-Vitamin D 600-400 MG-UNIT per tablet Take 1 tablet by mouth 2 (two) times daily.     . cetirizine (ZYRTEC) 10 MG tablet Take 10 mg by mouth daily.    . Multiple Vitamin (MULTI-VITAMINS) TABS Take 1 tablet by mouth daily.     Marland Kitchen omeprazole (PRILOSEC) 40 MG capsule Take 40 mg by mouth daily.     . saw palmetto 500 MG capsule Take 500 mg by mouth daily.    . sildenafil (VIAGRA) 100 MG tablet Take 100 mg by mouth as needed.      No current facility-administered medications for this visit.    Facility-Administered Medications Ordered in Other Visits  Medication Dose Route Frequency Provider Last Rate Last Dose  . sodium chloride flush (NS) 0.9 % injection 10 mL  10 mL Intravenous PRN Lloyd Huger, MD   10 mL at 10/19/16 1456    OBJECTIVE: Vitals:   12/25/16 1401  BP: 131/75  Pulse: 78  Resp: 18  Temp: 97.8 F (36.6 C)     Body mass index is 29.47 kg/m.  ECOG FS:0 - Asymptomatic  General: Well-developed, well-nourished, no acute distress. Eyes: anicteric sclera. Neck: No palpable lymphadenopathy. Lungs: Clear to auscultation bilaterally. Heart: Regular rate and rhythm. No rubs, murmurs, or gallops. Abdomen: Soft, nontender, nondistended. No organomegaly noted, normoactive bowel sounds. Musculoskeletal: No edema, cyanosis, or clubbing. Neuro: Alert, answering all questions appropriately. Cranial nerves grossly intact. Skin: No erythema or rash Psych: Normal affect. Lymphatics: No cervical, calvicular, axillary or inguinal LAD.   LAB  RESULTS:  Lab Results  Component Value Date   NA 138 12/11/2016   K 3.7 12/11/2016   CL 105 12/11/2016   CO2 27 12/11/2016   GLUCOSE 99 12/11/2016   BUN <5 (L) 12/11/2016   CREATININE 0.95 12/11/2016   CALCIUM 9.0 12/11/2016   PROT 6.7 12/11/2016   ALBUMIN 4.1 12/11/2016   AST 25 12/11/2016   ALT 21 12/11/2016   ALKPHOS 65 12/11/2016   BILITOT 1.3 (H) 12/11/2016   GFRNONAA >60 12/11/2016   GFRAA >60 12/11/2016    Lab Results  Component Value Date   WBC 6.4 12/11/2016   NEUTROABS 4.2 12/11/2016   HGB 14.3 12/11/2016   HCT 42.0 12/11/2016   MCV 89.8 12/11/2016   PLT 169 12/11/2016     STUDIES: Ct Soft Tissue Neck W Contrast  Result Date: 12/21/2016 CLINICAL DATA:  Follicular lymphoma.  Completed chemotherapy EXAM: CT NECK WITH CONTRAST TECHNIQUE: Multidetector CT imaging of the neck was performed using the standard protocol following the bolus administration of intravenous contrast. CONTRAST:  100 mL Isovue 370 IV COMPARISON:  CT neck 07/01/2016 FINDINGS: Pharynx and larynx: Normal. No mass or swelling. Salivary glands: 6 mm enhancing nodule left parotid tail. This has been present previously but appears slightly more prominent. Remaining salivary glands normal. Thyroid: Negative Lymph nodes: Right level 2 lymph node 10 mm unchanged. Small posterior lymph nodes on the right unchanged. 11 mm supraclavicular node on the right unchanged Left submandibular nodes have enlarged since the prior study. There is a cluster of nodes around the left submandibular gland measuring 75mm and 9 mm. Otherwise lymph nodes in the left neck are stable. 6 mm left level 2 lymph nodes stable. Left supraclavicular node nodes 9 mm 11 mm unchanged. Left posterior mid neck lymph node 13 mm unchanged. Additional posterior lymph nodes are stable. Vascular: Negative Limited intracranial: Negative Skeleton: Cervical degenerative change.  No acute abnormality. Upper chest: Lung apices clear. No adenopathy in the  superior mediastinum. Other: None IMPRESSION: Bilateral cervical lymph nodes left greater than right are stable. Left submandibular nodes are not pathologically enlarged but are more prominent and have enlarged since the prior study 6 mm enhancing smooth solid nodule left parotid tail is slowly growing and could represent Warthin's tumor. Electronically Signed   By: Franchot Gallo M.D.   On: 12/21/2016 13:00   Ct Chest W Contrast  Result Date: 12/21/2016 CLINICAL DATA:  Restaging follicular lymphoma. Grade 2 in the RIGHT orbit. Patient status post immunotherapy B-cell directed (rituximab and Zevelin) 2014. EXAM: CT CHEST, ABDOMEN, AND PELVIS WITH CONTRAST TECHNIQUE: Multidetector CT imaging of the chest, abdomen and pelvis was performed following the standard protocol during bolus administration of intravenous contrast. CONTRAST:  100 mL Isovue COMPARISON:  CT 07/01/2016 FINDINGS: CT CHEST FINDINGS Cardiovascular: No significant vascular findings. Normal heart size. No pericardial effusion. Mediastinum/Nodes: Multiple small axillary lymph nodes similar comparison exam. Port in the RIGHT chest wall. No mediastinal hilar adenopathy. No pericardial effusion. Esophagus normal Lungs/Pleura: Bibasilar atelectasis unchanged. No new nodularity. Small LEFT  upper lobe pulmonary nodules unchanged. Musculoskeletal: No aggressive osseous lesion. CT ABDOMEN AND PELVIS FINDINGS Hepatobiliary: No focal hepatic lesion. No biliary ductal dilatation. Gallbladder is normal. Very small gallstone noted Common bile duct is normal. Pancreas: Pancreas is normal. No ductal dilatation. No pancreatic inflammation. Spleen: Normal spleen . Adrenals/urinary tract: Bilateral low-density enlargement adrenal glands unchanged bilateral renal cortical thinning unchanged. Ureters and bladder normal. Stomach/Bowel: Stomach, small bowel, appendix, and cecum are normal. The colon and rectosigmoid colon are normal. Vascular/Lymphatic: Abdominal aorta  is normal caliber. There is no retroperitoneal or periportal lymphadenopathy. No pelvic lymphadenopathy. Reproductive: Prostate enlarged Other: No peritoneal disease.  No free-fluid Musculoskeletal: No aggressive osseous lesion. IMPRESSION: Chest Impression: 1. No evidence of lymphoma recurrence in the thorax. Abdomen / Pelvis Impression: 1. No evidence of lymphoma recurrence in the abdomen pelvis. 2. Stable low-density enlargement adrenal glands. Electronically Signed   By: Suzy Bouchard M.D.   On: 12/21/2016 14:40   Ct Abdomen Pelvis W Contrast  Result Date: 12/21/2016 CLINICAL DATA:  Restaging follicular lymphoma. Grade 2 in the RIGHT orbit. Patient status post immunotherapy B-cell directed (rituximab and Zevelin) 2014. EXAM: CT CHEST, ABDOMEN, AND PELVIS WITH CONTRAST TECHNIQUE: Multidetector CT imaging of the chest, abdomen and pelvis was performed following the standard protocol during bolus administration of intravenous contrast. CONTRAST:  100 mL Isovue COMPARISON:  CT 07/01/2016 FINDINGS: CT CHEST FINDINGS Cardiovascular: No significant vascular findings. Normal heart size. No pericardial effusion. Mediastinum/Nodes: Multiple small axillary lymph nodes similar comparison exam. Port in the RIGHT chest wall. No mediastinal hilar adenopathy. No pericardial effusion. Esophagus normal Lungs/Pleura: Bibasilar atelectasis unchanged. No new nodularity. Small LEFT upper lobe pulmonary nodules unchanged. Musculoskeletal: No aggressive osseous lesion. CT ABDOMEN AND PELVIS FINDINGS Hepatobiliary: No focal hepatic lesion. No biliary ductal dilatation. Gallbladder is normal. Very small gallstone noted Common bile duct is normal. Pancreas: Pancreas is normal. No ductal dilatation. No pancreatic inflammation. Spleen: Normal spleen . Adrenals/urinary tract: Bilateral low-density enlargement adrenal glands unchanged bilateral renal cortical thinning unchanged. Ureters and bladder normal. Stomach/Bowel: Stomach, small  bowel, appendix, and cecum are normal. The colon and rectosigmoid colon are normal. Vascular/Lymphatic: Abdominal aorta is normal caliber. There is no retroperitoneal or periportal lymphadenopathy. No pelvic lymphadenopathy. Reproductive: Prostate enlarged Other: No peritoneal disease.  No free-fluid Musculoskeletal: No aggressive osseous lesion. IMPRESSION: Chest Impression: 1. No evidence of lymphoma recurrence in the thorax. Abdomen / Pelvis Impression: 1. No evidence of lymphoma recurrence in the abdomen pelvis. 2. Stable low-density enlargement adrenal glands. Electronically Signed   By: Suzy Bouchard M.D.   On: 12/21/2016 14:40   Ct Maxillofacial W Contrast  Result Date: 12/21/2016 CLINICAL DATA:  Follicular lymphoma on eye.  Completed chemotherapy. EXAM: CT MAXILLOFACIAL WITH CONTRAST TECHNIQUE: Multidetector CT imaging of the maxillofacial structures was performed with intravenous contrast. Multiplanar CT image reconstructions were also generated. CONTRAST:  100 mL Isovue 370 IV COMPARISON:  CT face 07/01/2016, 12/18/2015  . FINDINGS: Osseous: No lytic or sclerotic bone lesions. Negative for fracture next healed Orbits: Right lens replacement. No orbital mass. Orbital fat is normal. Extraocular muscles and optic nerve normal bilaterally. Cavernous sinus normal. 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 IMPRESSION: Negative for recurrent mass.  No acute abnormality. Electronically Signed   By: Franchot Gallo M.D.   On: 12/21/2016 12:49    ASSESSMENT: Follicular lymphoma,  grade II in right orbit, in remission  PLAN:    1. 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  Patient had recurrence in his right orbit.  CT scan results from July 01, 2016 reviewed independently and reported as above again with minimal increase in several lymph nodes.  Although lymph nodes are nonpathologic, given his history will monitor closely. Return to clinic in 6 months with repeat laboratory work, imaging, and further evaluation.    Patient expressed understanding and was in agreement with this plan. He also understands that He can call clinic at any time with any questions, concerns, or complaints.    Jacquelin Hawking, NP   12/25/2016 2:17 PM

## 2016-12-25 ENCOUNTER — Ambulatory Visit: Payer: Medicare Other | Admitting: Oncology

## 2016-12-25 ENCOUNTER — Inpatient Hospital Stay (HOSPITAL_BASED_OUTPATIENT_CLINIC_OR_DEPARTMENT_OTHER): Payer: Medicare Other | Admitting: Oncology

## 2016-12-25 VITALS — BP 131/75 | HR 78 | Temp 97.8°F | Resp 18 | Wt 211.3 lb

## 2016-12-25 DIAGNOSIS — Z800 Family history of malignant neoplasm of digestive organs: Secondary | ICD-10-CM

## 2016-12-25 DIAGNOSIS — Z923 Personal history of irradiation: Secondary | ICD-10-CM | POA: Diagnosis not present

## 2016-12-25 DIAGNOSIS — C8211 Follicular lymphoma grade II, lymph nodes of head, face, and neck: Secondary | ICD-10-CM | POA: Diagnosis not present

## 2016-12-25 DIAGNOSIS — N400 Enlarged prostate without lower urinary tract symptoms: Secondary | ICD-10-CM | POA: Diagnosis not present

## 2016-12-25 DIAGNOSIS — Z801 Family history of malignant neoplasm of trachea, bronchus and lung: Secondary | ICD-10-CM | POA: Diagnosis not present

## 2016-12-25 DIAGNOSIS — Z7982 Long term (current) use of aspirin: Secondary | ICD-10-CM

## 2016-12-25 DIAGNOSIS — H532 Diplopia: Secondary | ICD-10-CM

## 2016-12-25 DIAGNOSIS — K219 Gastro-esophageal reflux disease without esophagitis: Secondary | ICD-10-CM

## 2016-12-25 DIAGNOSIS — G4730 Sleep apnea, unspecified: Secondary | ICD-10-CM

## 2016-12-25 DIAGNOSIS — Z9221 Personal history of antineoplastic chemotherapy: Secondary | ICD-10-CM

## 2016-12-25 DIAGNOSIS — Z79899 Other long term (current) drug therapy: Secondary | ICD-10-CM

## 2017-01-26 ENCOUNTER — Inpatient Hospital Stay: Payer: Medicare Other | Attending: Oncology

## 2017-01-26 DIAGNOSIS — Z452 Encounter for adjustment and management of vascular access device: Secondary | ICD-10-CM | POA: Diagnosis not present

## 2017-01-26 DIAGNOSIS — C8211 Follicular lymphoma grade II, lymph nodes of head, face, and neck: Secondary | ICD-10-CM | POA: Insufficient documentation

## 2017-01-26 DIAGNOSIS — Z923 Personal history of irradiation: Secondary | ICD-10-CM | POA: Diagnosis not present

## 2017-01-26 DIAGNOSIS — Z9221 Personal history of antineoplastic chemotherapy: Secondary | ICD-10-CM | POA: Diagnosis not present

## 2017-01-26 DIAGNOSIS — Z95828 Presence of other vascular implants and grafts: Secondary | ICD-10-CM

## 2017-01-26 MED ORDER — HEPARIN SOD (PORK) LOCK FLUSH 100 UNIT/ML IV SOLN
500.0000 [IU] | Freq: Once | INTRAVENOUS | Status: AC
  Administered 2017-01-26: 500 [IU] via INTRAVENOUS
  Filled 2017-01-26: qty 5

## 2017-01-26 MED ORDER — SODIUM CHLORIDE 0.9% FLUSH
10.0000 mL | INTRAVENOUS | Status: DC | PRN
  Administered 2017-01-26: 10 mL via INTRAVENOUS
  Filled 2017-01-26: qty 10

## 2017-03-16 ENCOUNTER — Inpatient Hospital Stay: Payer: Medicare Other | Attending: Oncology

## 2017-03-16 DIAGNOSIS — C8211 Follicular lymphoma grade II, lymph nodes of head, face, and neck: Secondary | ICD-10-CM | POA: Insufficient documentation

## 2017-03-16 DIAGNOSIS — Z923 Personal history of irradiation: Secondary | ICD-10-CM | POA: Insufficient documentation

## 2017-03-16 DIAGNOSIS — Z452 Encounter for adjustment and management of vascular access device: Secondary | ICD-10-CM | POA: Diagnosis not present

## 2017-03-16 DIAGNOSIS — Z9221 Personal history of antineoplastic chemotherapy: Secondary | ICD-10-CM | POA: Diagnosis not present

## 2017-03-16 DIAGNOSIS — Z95828 Presence of other vascular implants and grafts: Secondary | ICD-10-CM

## 2017-03-16 MED ORDER — SODIUM CHLORIDE 0.9% FLUSH
10.0000 mL | INTRAVENOUS | Status: DC | PRN
  Administered 2017-03-16: 10 mL via INTRAVENOUS
  Filled 2017-03-16: qty 10

## 2017-03-16 MED ORDER — HEPARIN SOD (PORK) LOCK FLUSH 100 UNIT/ML IV SOLN
500.0000 [IU] | Freq: Once | INTRAVENOUS | Status: AC
  Administered 2017-03-16: 500 [IU] via INTRAVENOUS
  Filled 2017-03-16: qty 5

## 2017-04-27 ENCOUNTER — Inpatient Hospital Stay: Payer: Medicare Other | Attending: Plastic Surgery

## 2017-04-27 DIAGNOSIS — Z452 Encounter for adjustment and management of vascular access device: Secondary | ICD-10-CM | POA: Insufficient documentation

## 2017-04-27 DIAGNOSIS — C8211 Follicular lymphoma grade II, lymph nodes of head, face, and neck: Secondary | ICD-10-CM | POA: Insufficient documentation

## 2017-04-27 DIAGNOSIS — Z95828 Presence of other vascular implants and grafts: Secondary | ICD-10-CM

## 2017-04-27 MED ORDER — HEPARIN SOD (PORK) LOCK FLUSH 100 UNIT/ML IV SOLN
500.0000 [IU] | Freq: Once | INTRAVENOUS | Status: AC
  Administered 2017-04-27: 500 [IU] via INTRAVENOUS

## 2017-04-27 MED ORDER — SODIUM CHLORIDE 0.9% FLUSH
10.0000 mL | INTRAVENOUS | Status: DC | PRN
  Administered 2017-04-27: 10 mL via INTRAVENOUS
  Filled 2017-04-27: qty 10

## 2017-06-08 ENCOUNTER — Inpatient Hospital Stay: Payer: Medicare Other | Attending: Oncology

## 2017-06-08 VITALS — BP 144/77 | HR 92 | Resp 20

## 2017-06-08 DIAGNOSIS — Z801 Family history of malignant neoplasm of trachea, bronchus and lung: Secondary | ICD-10-CM | POA: Insufficient documentation

## 2017-06-08 DIAGNOSIS — C8290 Follicular lymphoma, unspecified, unspecified site: Secondary | ICD-10-CM

## 2017-06-08 DIAGNOSIS — Z95828 Presence of other vascular implants and grafts: Secondary | ICD-10-CM

## 2017-06-08 DIAGNOSIS — Z9221 Personal history of antineoplastic chemotherapy: Secondary | ICD-10-CM | POA: Insufficient documentation

## 2017-06-08 DIAGNOSIS — C8211 Follicular lymphoma grade II, lymph nodes of head, face, and neck: Secondary | ICD-10-CM | POA: Insufficient documentation

## 2017-06-08 DIAGNOSIS — H532 Diplopia: Secondary | ICD-10-CM | POA: Insufficient documentation

## 2017-06-08 DIAGNOSIS — Z800 Family history of malignant neoplasm of digestive organs: Secondary | ICD-10-CM | POA: Diagnosis not present

## 2017-06-08 LAB — CBC WITH DIFFERENTIAL/PLATELET
BASOS ABS: 0.1 10*3/uL (ref 0–0.1)
BASOS PCT: 1 %
EOS ABS: 0.1 10*3/uL (ref 0–0.7)
EOS PCT: 1 %
HCT: 42.6 % (ref 40.0–52.0)
Hemoglobin: 14.7 g/dL (ref 13.0–18.0)
Lymphocytes Relative: 24 %
Lymphs Abs: 1.3 10*3/uL (ref 1.0–3.6)
MCH: 31 pg (ref 26.0–34.0)
MCHC: 34.5 g/dL (ref 32.0–36.0)
MCV: 90 fL (ref 80.0–100.0)
MONO ABS: 0.5 10*3/uL (ref 0.2–1.0)
Monocytes Relative: 8 %
Neutro Abs: 3.7 10*3/uL (ref 1.4–6.5)
Neutrophils Relative %: 66 %
PLATELETS: 161 10*3/uL (ref 150–440)
RBC: 4.74 MIL/uL (ref 4.40–5.90)
RDW: 13.3 % (ref 11.5–14.5)
WBC: 5.7 10*3/uL (ref 3.8–10.6)

## 2017-06-08 LAB — COMPREHENSIVE METABOLIC PANEL
ALK PHOS: 69 U/L (ref 38–126)
ALT: 21 U/L (ref 17–63)
AST: 25 U/L (ref 15–41)
Albumin: 4 g/dL (ref 3.5–5.0)
Anion gap: 7 (ref 5–15)
BILIRUBIN TOTAL: 0.9 mg/dL (ref 0.3–1.2)
BUN: 10 mg/dL (ref 6–20)
CALCIUM: 8.5 mg/dL — AB (ref 8.9–10.3)
CO2: 27 mmol/L (ref 22–32)
CREATININE: 1.01 mg/dL (ref 0.61–1.24)
Chloride: 105 mmol/L (ref 101–111)
GFR calc Af Amer: >60 mL/min (ref >60)
GLUCOSE: 101 mg/dL — AB (ref 65–99)
Potassium: 3.6 mmol/L (ref 3.5–5.1)
SODIUM: 139 mmol/L (ref 135–145)
TOTAL PROTEIN: 6.5 g/dL (ref 6.5–8.1)

## 2017-06-08 MED ORDER — SODIUM CHLORIDE 0.9% FLUSH
10.0000 mL | INTRAVENOUS | Status: DC | PRN
  Administered 2017-06-08: 10 mL via INTRAVENOUS
  Filled 2017-06-08: qty 10

## 2017-06-08 MED ORDER — HEPARIN SOD (PORK) LOCK FLUSH 100 UNIT/ML IV SOLN
500.0000 [IU] | Freq: Once | INTRAVENOUS | Status: AC
  Administered 2017-06-08: 500 [IU] via INTRAVENOUS

## 2017-06-16 ENCOUNTER — Ambulatory Visit
Admission: RE | Admit: 2017-06-16 | Discharge: 2017-06-16 | Disposition: A | Discharge status: Home / Self Care | Payer: Medicare Other | Source: Ambulatory Visit | Attending: Oncology | Admitting: Oncology

## 2017-06-16 DIAGNOSIS — J9811 Atelectasis: Secondary | ICD-10-CM | POA: Diagnosis not present

## 2017-06-16 DIAGNOSIS — C8211 Follicular lymphoma grade II, lymph nodes of head, face, and neck: Secondary | ICD-10-CM | POA: Diagnosis not present

## 2017-06-16 DIAGNOSIS — D3501 Benign neoplasm of right adrenal gland: Secondary | ICD-10-CM | POA: Diagnosis not present

## 2017-06-16 DIAGNOSIS — D3502 Benign neoplasm of left adrenal gland: Secondary | ICD-10-CM | POA: Insufficient documentation

## 2017-06-16 DIAGNOSIS — R591 Generalized enlarged lymph nodes: Secondary | ICD-10-CM | POA: Insufficient documentation

## 2017-06-16 MED ORDER — IOPAMIDOL (ISOVUE-300) INJECTION 61%
100.0000 mL | Freq: Once | INTRAVENOUS | Status: AC | PRN
  Administered 2017-06-16: 100 mL via INTRAVENOUS

## 2017-06-17 NOTE — Progress Notes (Signed)
He is seeing you next week. Just wanted to send the scan to you. Looks stable.

## 2017-06-20 NOTE — Progress Notes (Deleted)
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 He currently feels well and is asymptomatic. The double vision in his right eye is chronic and unchanged.  He has no neurologic complaints.  He denies any fevers, weight loss, or night sweats.  He has noted no new lymphadenopathy.  He denies any chest pain or shortness of breath.  He denies any nausea, vomiting, constipation, or diarrhea.  He has no urinary complaints.  Patient offers no specific complaints today.  REVIEW OF SYSTEMS:   Review of Systems  Constitutional: Negative for diaphoresis, fever, malaise/fatigue and weight loss.  Eyes: Positive for double vision. Negative for blurred vision and pain.  Respiratory: Negative.  Negative for cough and shortness of breath.   Cardiovascular: Negative.  Negative for chest pain and leg swelling.  Gastrointestinal: Negative.  Negative for abdominal pain.  Genitourinary: Negative.   Musculoskeletal: Negative.   Neurological: Negative.  Negative for sensory change and weakness.  Psychiatric/Behavioral: Negative.  The patient is not nervous/anxious.     As per HPI. Otherwise, a complete review of systems is negative.  PAST MEDICAL HISTORY: Past Medical History:  Diagnosis Date  . Follicular lymphoma ([place]) 2009   Patient had chemo + rad tx's and zevaline shots.  Marland Kitchen GERD (gastroesophageal reflux disease)   . Hemorrhoids   . Sleep apnea     PAST SURGICAL HISTORY: Hemorrhoidectomy.  FAMILY HISTORY: Colon cancer and lung cancer.     ADVANCED DIRECTIVES:    HEALTH MAINTENANCE: Social History   Tobacco  Use  . Smoking status: Never Smoker  . Smokeless tobacco: Never Used  Substance Use Topics  . Alcohol use: Yes    Comment: occasional  . Drug use: No     No Known Allergies  Current Outpatient Medications  Medication Sig Dispense Refill  . amLODipine (NORVASC) 5 MG tablet Take 5 mg by mouth daily.     Marland Kitchen aspirin EC 81 MG tablet Take 81 mg by mouth daily.     Marland Kitchen atorvastatin (LIPITOR) 10 MG tablet Take 10 mg by mouth daily at 6 PM.     . Calcium Carbonate-Vitamin D 600-400 MG-UNIT per tablet Take 1 tablet by mouth 2 (two) times daily.     . cetirizine (ZYRTEC) 10 MG tablet Take 10 mg by mouth daily.    . Multiple Vitamin (MULTI-VITAMINS) TABS Take 1 tablet by mouth daily.     Marland Kitchen omeprazole (PRILOSEC) 40 MG capsule Take 40 mg by mouth daily.     . saw palmetto 500 MG capsule Take 500 mg by mouth daily.    . sildenafil (VIAGRA) 100 MG tablet Take 100 mg by mouth as needed.      No current facility-administered medications for this visit.    Facility-Administered Medications Ordered in Other Visits  Medication Dose Route Frequency Provider Last Rate Last Dose  . sodium chloride flush (NS) 0.9 % injection 10 mL  10 mL Intravenous PRN Lloyd Huger, MD   10 mL at 10/19/16 1456    OBJECTIVE: There were no vitals filed for this visit.   There is no height or weight on file to calculate BMI.  ECOG FS:0 - Asymptomatic  General: Well-developed, well-nourished, no acute distress. Eyes: anicteric sclera. Neck: No palpable lymphadenopathy. Lungs: Clear to auscultation bilaterally. Heart: Regular rate and rhythm. No rubs, murmurs, or gallops. Abdomen: Soft, nontender, nondistended. No organomegaly noted, normoactive bowel sounds. Musculoskeletal: No edema, cyanosis, or clubbing. Neuro: Alert, answering all questions appropriately. Cranial nerves grossly intact. Skin: No erythema or rash Psych: Normal affect. Lymphatics: No cervical, calvicular, axillary or inguinal LAD.   LAB  RESULTS:  Lab Results  Component Value Date   NA 139 06/08/2017   K 3.6 06/08/2017   CL 105 06/08/2017   CO2 27 06/08/2017   GLUCOSE 101 (H) 06/08/2017   BUN 10 06/08/2017   CREATININE 1.01 06/08/2017   CALCIUM 8.5 (L) 06/08/2017   PROT 6.5 06/08/2017   ALBUMIN 4.0 06/08/2017   AST 25 06/08/2017   ALT 21 06/08/2017   ALKPHOS 69 06/08/2017   BILITOT 0.9 06/08/2017   GFRNONAA >60 06/08/2017   GFRAA >60 06/08/2017    Lab Results  Component Value Date   WBC 5.7 06/08/2017   NEUTROABS 3.7 06/08/2017   HGB 14.7 06/08/2017   HCT 42.6 06/08/2017   MCV 90.0 06/08/2017   PLT 161 06/08/2017     STUDIES: Ct Soft Tissue Neck W Contrast  Result Date: 06/16/2017 CLINICAL DATA:  Restaging follicular lymphoma the right orbit. EXAM: CT NECK WITH CONTRAST TECHNIQUE: Multidetector CT imaging of the neck was performed using the standard protocol following the bolus administration of intravenous contrast. CONTRAST:  113mL ISOVUE-300 IOPAMIDOL (ISOVUE-300) INJECTION 61% COMPARISON:  12/21/2016 FINDINGS: Pharynx and larynx: No mucosal or submucosal lesion. Salivary glands: Parotid and submandibular glands are normal. Small intraparotid lymph nodes appear the same as seen previously. Thyroid: Normal Lymph nodes: Bilateral lymphadenopathy is stable. Enlarged nodes on both sides of the neck at all nodal stations are not changed allowing for technical variation. Right level 2 node measures 9 mm. Right supraclavicular node measures 11-12 mm. Left level 1 submandibular nodes are similar, index node measuring 9 mm. Supraclavicular node measures 11 mm, unchanged on the left. No new enlarged nodes are seen. Vascular: No abnormal vascular finding. Limited intracranial: Normal Visualized orbits: See results of facial CT. Mastoids and visualized paranasal sinuses: Clear Skeleton: Ordinary cervical spondylosis. Upper chest: See results of chest CT. Other: None IMPRESSION: Stable bilateral neck lymphadenopathy.  No  new or enlarging nodes. Electronically Signed   By: Nelson Chimes M.D.   On: 06/16/2017 15:43   Ct Chest W Contrast  Result Date: 06/17/2017 CLINICAL DATA:  Restaging follicular lymphoma. Grade 2 lymphoma the RIGHT orbit. Patient status post rituximab and supple in therapy in 2014. Initial diagnosis 2009. EXAM: CT CHEST, ABDOMEN, AND PELVIS WITH CONTRAST TECHNIQUE: Multidetector CT imaging of the chest, abdomen and pelvis was performed following the standard protocol during bolus administration of intravenous contrast. CONTRAST:  161mL ISOVUE-300 IOPAMIDOL (ISOVUE-300) INJECTION 61% COMPARISON:  CT 12/21/2016 FINDINGS: CT CHEST FINDINGS Cardiovascular: No significant vascular findings. Normal heart size. No pericardial effusion. Mediastinum/Nodes: Numerous axillary lymph nodes measure up to 11 mm short axis. Port in the anterior chest wall with tip in distal SVC. No supraclavicular adenopathy. No mediastinal hilar adenopathy. No pericardial effusion. Esophagus normal Lungs/Pleura: Small LEFT upper lobe pulmonary nodule measuring 4 mm (image 36, series 4 unchanged0. Bands of bibasilar atelectasis greater on the LEFT. No change from comparison exam. Musculoskeletal: No aggressive osseous lesion. CT ABDOMEN AND PELVIS FINDINGS Hepatobiliary: No focal hepatic lesion.  Normal gallbladder. Pancreas: Pancreas is normal. No  ductal dilatation. No pancreatic inflammation. Spleen: Normal spleen Adrenals/urinary tract: Bilateral adrenal gland enlargement not changed from comparison exam. Lesion has washout characteristics consistent benign adenoma. Bilateral renal cortical thinning.  Ureters and bladder normal. Stomach/Bowel: Stomach, small bowel, appendix, and cecum are normal. The colon and rectosigmoid colon are normal. Vascular/Lymphatic: Abdominal aorta is normal caliber. There is no retroperitoneal or periportal lymphadenopathy. No pelvic lymphadenopathy. SINGLE LYMPH NODE IN THE POSTERIOR ASPECT OF THE CENTRAL  MESENTERY (JUST BELOW THE KIDNEYS) MEASURES 7 MM (IMAGE 81, SERIES 3). THIS LYMPH NODE IS SLOWLY INCREASED IN SIZE FROM 3 MM ON CT 12/18/2015. Reproductive: Prostate gland normal Other: No free fluid. Musculoskeletal: No aggressive osseous lesion. IMPRESSION: Chest Impression: 1. Prominent bilateral axillary lymph nodes are not changed from prior. 2. Bibasilar atelectasis unchanged. 3. No evidence of lymphoma the progression/recurrence. Abdomen / Pelvis Impression: 1. No evidence of lymphoma recurrence in the abdomen pelvis. 2. A single lymph node in the posterior central mesentery slowly increased in size from 2017. Recommend attention on follow-up. 3. Stable adrenal adenomas. Electronically Signed   By: Suzy Bouchard M.D.   On: 06/17/2017 09:54   Ct Abdomen Pelvis W Contrast  Result Date: 06/17/2017 CLINICAL DATA:  Restaging follicular lymphoma. Grade 2 lymphoma the RIGHT orbit. Patient status post rituximab and supple in therapy in 2014. Initial diagnosis 2009. EXAM: CT CHEST, ABDOMEN, AND PELVIS WITH CONTRAST TECHNIQUE: Multidetector CT imaging of the chest, abdomen and pelvis was performed following the standard protocol during bolus administration of intravenous contrast. CONTRAST:  152mL ISOVUE-300 IOPAMIDOL (ISOVUE-300) INJECTION 61% COMPARISON:  CT 12/21/2016 FINDINGS: CT CHEST FINDINGS Cardiovascular: No significant vascular findings. Normal heart size. No pericardial effusion. Mediastinum/Nodes: Numerous axillary lymph nodes measure up to 11 mm short axis. Port in the anterior chest wall with tip in distal SVC. No supraclavicular adenopathy. No mediastinal hilar adenopathy. No pericardial effusion. Esophagus normal Lungs/Pleura: Small LEFT upper lobe pulmonary nodule measuring 4 mm (image 36, series 4 unchanged0. Bands of bibasilar atelectasis greater on the LEFT. No change from comparison exam. Musculoskeletal: No aggressive osseous lesion. CT ABDOMEN AND PELVIS FINDINGS Hepatobiliary: No focal  hepatic lesion.  Normal gallbladder. Pancreas: Pancreas is normal. No ductal dilatation. No pancreatic inflammation. Spleen: Normal spleen Adrenals/urinary tract: Bilateral adrenal gland enlargement not changed from comparison exam. Lesion has washout characteristics consistent benign adenoma. Bilateral renal cortical thinning.  Ureters and bladder normal. Stomach/Bowel: Stomach, small bowel, appendix, and cecum are normal. The colon and rectosigmoid colon are normal. Vascular/Lymphatic: Abdominal aorta is normal caliber. There is no retroperitoneal or periportal lymphadenopathy. No pelvic lymphadenopathy. SINGLE LYMPH NODE IN THE POSTERIOR ASPECT OF THE CENTRAL MESENTERY (JUST BELOW THE KIDNEYS) MEASURES 7 MM (IMAGE 81, SERIES 3). THIS LYMPH NODE IS SLOWLY INCREASED IN SIZE FROM 3 MM ON CT 12/18/2015. Reproductive: Prostate gland normal Other: No free fluid. Musculoskeletal: No aggressive osseous lesion. IMPRESSION: Chest Impression: 1. Prominent bilateral axillary lymph nodes are not changed from prior. 2. Bibasilar atelectasis unchanged. 3. No evidence of lymphoma the progression/recurrence. Abdomen / Pelvis Impression: 1. No evidence of lymphoma recurrence in the abdomen pelvis. 2. A single lymph node in the posterior central mesentery slowly increased in size from 2017. Recommend attention on follow-up. 3. Stable adrenal adenomas. Electronically Signed   By: Suzy Bouchard M.D.   On: 06/17/2017 09:54   Ct Maxillofacial W Contrast  Result Date: 06/16/2017 CLINICAL DATA:  Follicular lymphoma orbit EXAM: CT MAXILLOFACIAL WITH CONTRAST TECHNIQUE: Multidetector CT imaging of the maxillofacial structures was performed with intravenous  contrast. Multiplanar CT image reconstructions were also generated. CONTRAST:  186mL ISOVUE-300 IOPAMIDOL (ISOVUE-300) INJECTION 61% COMPARISON:  CT neck from today and 12/21/2016.  CT face 12/21/2016 FINDINGS: Osseous: Negative for mass lesion.  No fracture or bone lesion.  Orbits: Right cataract removal.  No orbital mass or edema. Sinuses: Negative Soft tissues: 8 mm right submandibular node slightly smaller compared with 12/21/2016. Right level 2 lymph node 9 mm unchanged. Left submandibular nodes 6 mm and 8 mm similar to the prior CT. 7 mm left level 2 lymph node unchanged. 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 Fetal origin right posterior cerebral artery. No intracranial mass lesion. IMPRESSION: Negative for mass lesion. Stable submandibular lymph nodes bilaterally. Electronically Signed   By: Franchot Gallo M.D.   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  Patient had recurrence in his right orbit. CT scan results from July 01, 2016 reviewed independently and reported as above again with minimal increase in several lymph nodes.  Although lymph nodes are nonpathologic, given his history will monitor closely. Return to clinic in 6 months with repeat laboratory work, imaging, and further evaluation.    Patient expressed understanding and was in agreement with this plan. He also understands that He can call clinic at any time with any questions, concerns, or complaints.    Lloyd Huger, MD   06/20/2017 10:44 AM

## 2017-06-22 ENCOUNTER — Encounter: Payer: Self-pay | Admitting: Oncology

## 2017-06-22 ENCOUNTER — Inpatient Hospital Stay (HOSPITAL_BASED_OUTPATIENT_CLINIC_OR_DEPARTMENT_OTHER): Payer: Medicare Other | Admitting: Oncology

## 2017-06-22 VITALS — BP 122/77 | HR 100 | Temp 97.6°F | Resp 18 | Wt 215.0 lb

## 2017-06-22 DIAGNOSIS — H532 Diplopia: Secondary | ICD-10-CM

## 2017-06-22 DIAGNOSIS — Z801 Family history of malignant neoplasm of trachea, bronchus and lung: Secondary | ICD-10-CM

## 2017-06-22 DIAGNOSIS — C8291 Follicular lymphoma, unspecified, lymph nodes of head, face, and neck: Secondary | ICD-10-CM

## 2017-06-22 DIAGNOSIS — C8211 Follicular lymphoma grade II, lymph nodes of head, face, and neck: Secondary | ICD-10-CM | POA: Diagnosis not present

## 2017-06-22 DIAGNOSIS — Z9221 Personal history of antineoplastic chemotherapy: Secondary | ICD-10-CM

## 2017-06-22 DIAGNOSIS — Z800 Family history of malignant neoplasm of digestive organs: Secondary | ICD-10-CM

## 2017-06-22 NOTE — Progress Notes (Signed)
Pt in for follow up, denies any difficulties or concerns.  

## 2017-06-23 NOTE — Progress Notes (Signed)
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 He continues to feel well and is asymptomatic. The double vision in his right eye is slightly worse and he has an appointment with ophthalmology in the near future. He has no neurologic complaints.  He denies any fevers, weight loss, or night sweats.  He has noted no new lymphadenopathy.  He denies any chest pain or shortness of breath.  He denies any nausea, vomiting, constipation, or diarrhea.  He has no urinary complaints.  Patient offers no further specific complaints today.  REVIEW OF SYSTEMS:   Review of Systems  Constitutional: Negative for diaphoresis, fever, malaise/fatigue and weight loss.  Eyes: Positive for double vision. Negative for blurred vision and pain.  Respiratory: Negative.  Negative for cough and shortness of breath.   Cardiovascular: Negative.  Negative for chest pain and leg swelling.  Gastrointestinal: Negative.  Negative for abdominal pain.  Genitourinary: Negative.   Musculoskeletal: Negative.   Neurological: Negative.  Negative for sensory change and weakness.  Psychiatric/Behavioral: Negative.  The patient is not nervous/anxious.     As per HPI. Otherwise, a complete review of systems is negative.  PAST MEDICAL HISTORY: Past Medical History:  Diagnosis Date  . Follicular lymphoma ([place]) 2009   Patient had chemo + rad tx's and zevaline shots.  Marland Kitchen GERD (gastroesophageal reflux disease)   . Hemorrhoids   . Sleep apnea     PAST SURGICAL HISTORY: Hemorrhoidectomy.  FAMILY HISTORY: Colon cancer and lung cancer.     ADVANCED  DIRECTIVES:    HEALTH MAINTENANCE: Social History   Tobacco Use  . Smoking status: Never Smoker  . Smokeless tobacco: Never Used  Substance Use Topics  . Alcohol use: Yes    Comment: occasional  . Drug use: No     No Known Allergies  Current Outpatient Medications  Medication Sig Dispense Refill  . amLODipine (NORVASC) 5 MG tablet Take 5 mg by mouth daily.     Marland Kitchen aspirin EC 81 MG tablet Take 81 mg by mouth daily.     Marland Kitchen atorvastatin (LIPITOR) 10 MG tablet Take 10 mg by mouth daily at 6 PM.     . Calcium Carbonate-Vitamin D 600-400 MG-UNIT per tablet Take 1 tablet by mouth 2 (two) times daily.     . cetirizine (ZYRTEC) 10 MG tablet Take 10 mg by mouth daily.    . Multiple Vitamin (MULTI-VITAMINS) TABS Take 1 tablet by mouth daily.     Marland Kitchen omeprazole (PRILOSEC) 40 MG capsule Take 40 mg by mouth daily.     . sildenafil (VIAGRA) 100 MG tablet Take 100 mg by mouth as needed.      No current facility-administered medications for this visit.    Facility-Administered Medications Ordered in Other Visits  Medication Dose Route Frequency Provider Last Rate Last Dose  . sodium chloride flush (NS) 0.9 % injection 10 mL  10 mL Intravenous PRN Lloyd Huger, MD   10 mL at 10/19/16 1456    OBJECTIVE: Vitals:   06/22/17 1558  BP: 122/77  Pulse: 100  Resp: 18  Temp: 97.6 F (36.4 C)     Body mass index is  29.99 kg/m.    ECOG FS:0 - Asymptomatic  General: Well-developed, well-nourished, no acute distress. Eyes: anicteric sclera. Neck: No palpable lymphadenopathy. Lungs: Clear to auscultation bilaterally. Heart: Regular rate and rhythm. No rubs, murmurs, or gallops. Abdomen: Soft, nontender, nondistended. No organomegaly noted, normoactive bowel sounds. Musculoskeletal: No edema, cyanosis, or clubbing. Neuro: Alert, answering all questions appropriately. Cranial nerves grossly intact. Skin: No erythema or rash Psych: Normal affect. Lymphatics: No cervical, calvicular, axillary  or inguinal LAD.   LAB RESULTS:  Lab Results  Component Value Date   NA 139 06/08/2017   K 3.6 06/08/2017   CL 105 06/08/2017   CO2 27 06/08/2017   GLUCOSE 101 (H) 06/08/2017   BUN 10 06/08/2017   CREATININE 1.01 06/08/2017   CALCIUM 8.5 (L) 06/08/2017   PROT 6.5 06/08/2017   ALBUMIN 4.0 06/08/2017   AST 25 06/08/2017   ALT 21 06/08/2017   ALKPHOS 69 06/08/2017   BILITOT 0.9 06/08/2017   GFRNONAA >60 06/08/2017   GFRAA >60 06/08/2017    Lab Results  Component Value Date   WBC 5.7 06/08/2017   NEUTROABS 3.7 06/08/2017   HGB 14.7 06/08/2017   HCT 42.6 06/08/2017   MCV 90.0 06/08/2017   PLT 161 06/08/2017     STUDIES: Ct Soft Tissue Neck W Contrast  Result Date: 06/16/2017 CLINICAL DATA:  Restaging follicular lymphoma the right orbit. EXAM: CT NECK WITH CONTRAST TECHNIQUE: Multidetector CT imaging of the neck was performed using the standard protocol following the bolus administration of intravenous contrast. CONTRAST:  154mL ISOVUE-300 IOPAMIDOL (ISOVUE-300) INJECTION 61% COMPARISON:  12/21/2016 FINDINGS: Pharynx and larynx: No mucosal or submucosal lesion. Salivary glands: Parotid and submandibular glands are normal. Small intraparotid lymph nodes appear the same as seen previously. Thyroid: Normal Lymph nodes: Bilateral lymphadenopathy is stable. Enlarged nodes on both sides of the neck at all nodal stations are not changed allowing for technical variation. Right level 2 node measures 9 mm. Right supraclavicular node measures 11-12 mm. Left level 1 submandibular nodes are similar, index node measuring 9 mm. Supraclavicular node measures 11 mm, unchanged on the left. No new enlarged nodes are seen. Vascular: No abnormal vascular finding. Limited intracranial: Normal Visualized orbits: See results of facial CT. Mastoids and visualized paranasal sinuses: Clear Skeleton: Ordinary cervical spondylosis. Upper chest: See results of chest CT. Other: None IMPRESSION: Stable bilateral  neck lymphadenopathy.  No new or enlarging nodes. Electronically Signed   By: Nelson Chimes M.D.   On: 06/16/2017 15:43   Ct Chest W Contrast  Result Date: 06/17/2017 CLINICAL DATA:  Restaging follicular lymphoma. Grade 2 lymphoma the RIGHT orbit. Patient status post rituximab and supple in therapy in 2014. Initial diagnosis 2009. EXAM: CT CHEST, ABDOMEN, AND PELVIS WITH CONTRAST TECHNIQUE: Multidetector CT imaging of the chest, abdomen and pelvis was performed following the standard protocol during bolus administration of intravenous contrast. CONTRAST:  139mL ISOVUE-300 IOPAMIDOL (ISOVUE-300) INJECTION 61% COMPARISON:  CT 12/21/2016 FINDINGS: CT CHEST FINDINGS Cardiovascular: No significant vascular findings. Normal heart size. No pericardial effusion. Mediastinum/Nodes: Numerous axillary lymph nodes measure up to 11 mm short axis. Port in the anterior chest wall with tip in distal SVC. No supraclavicular adenopathy. No mediastinal hilar adenopathy. No pericardial effusion. Esophagus normal Lungs/Pleura: Small LEFT upper lobe pulmonary nodule measuring 4 mm (image 36, series 4 unchanged0. Bands of bibasilar atelectasis greater on the LEFT. No change from comparison exam. Musculoskeletal: No aggressive osseous lesion. CT ABDOMEN AND PELVIS FINDINGS Hepatobiliary: No focal hepatic lesion.  Normal gallbladder.  Pancreas: Pancreas is normal. No ductal dilatation. No pancreatic inflammation. Spleen: Normal spleen Adrenals/urinary tract: Bilateral adrenal gland enlargement not changed from comparison exam. Lesion has washout characteristics consistent benign adenoma. Bilateral renal cortical thinning.  Ureters and bladder normal. Stomach/Bowel: Stomach, small bowel, appendix, and cecum are normal. The colon and rectosigmoid colon are normal. Vascular/Lymphatic: Abdominal aorta is normal caliber. There is no retroperitoneal or periportal lymphadenopathy. No pelvic lymphadenopathy. SINGLE LYMPH NODE IN THE POSTERIOR  ASPECT OF THE CENTRAL MESENTERY (JUST BELOW THE KIDNEYS) MEASURES 7 MM (IMAGE 81, SERIES 3). THIS LYMPH NODE IS SLOWLY INCREASED IN SIZE FROM 3 MM ON CT 12/18/2015. Reproductive: Prostate gland normal Other: No free fluid. Musculoskeletal: No aggressive osseous lesion. IMPRESSION: Chest Impression: 1. Prominent bilateral axillary lymph nodes are not changed from prior. 2. Bibasilar atelectasis unchanged. 3. No evidence of lymphoma the progression/recurrence. Abdomen / Pelvis Impression: 1. No evidence of lymphoma recurrence in the abdomen pelvis. 2. A single lymph node in the posterior central mesentery slowly increased in size from 2017. Recommend attention on follow-up. 3. Stable adrenal adenomas. Electronically Signed   By: Suzy Bouchard M.D.   On: 06/17/2017 09:54   Ct Abdomen Pelvis W Contrast  Result Date: 06/17/2017 CLINICAL DATA:  Restaging follicular lymphoma. Grade 2 lymphoma the RIGHT orbit. Patient status post rituximab and supple in therapy in 2014. Initial diagnosis 2009. EXAM: CT CHEST, ABDOMEN, AND PELVIS WITH CONTRAST TECHNIQUE: Multidetector CT imaging of the chest, abdomen and pelvis was performed following the standard protocol during bolus administration of intravenous contrast. CONTRAST:  16mL ISOVUE-300 IOPAMIDOL (ISOVUE-300) INJECTION 61% COMPARISON:  CT 12/21/2016 FINDINGS: CT CHEST FINDINGS Cardiovascular: No significant vascular findings. Normal heart size. No pericardial effusion. Mediastinum/Nodes: Numerous axillary lymph nodes measure up to 11 mm short axis. Port in the anterior chest wall with tip in distal SVC. No supraclavicular adenopathy. No mediastinal hilar adenopathy. No pericardial effusion. Esophagus normal Lungs/Pleura: Small LEFT upper lobe pulmonary nodule measuring 4 mm (image 36, series 4 unchanged0. Bands of bibasilar atelectasis greater on the LEFT. No change from comparison exam. Musculoskeletal: No aggressive osseous lesion. CT ABDOMEN AND PELVIS FINDINGS  Hepatobiliary: No focal hepatic lesion.  Normal gallbladder. Pancreas: Pancreas is normal. No ductal dilatation. No pancreatic inflammation. Spleen: Normal spleen Adrenals/urinary tract: Bilateral adrenal gland enlargement not changed from comparison exam. Lesion has washout characteristics consistent benign adenoma. Bilateral renal cortical thinning.  Ureters and bladder normal. Stomach/Bowel: Stomach, small bowel, appendix, and cecum are normal. The colon and rectosigmoid colon are normal. Vascular/Lymphatic: Abdominal aorta is normal caliber. There is no retroperitoneal or periportal lymphadenopathy. No pelvic lymphadenopathy. SINGLE LYMPH NODE IN THE POSTERIOR ASPECT OF THE CENTRAL MESENTERY (JUST BELOW THE KIDNEYS) MEASURES 7 MM (IMAGE 81, SERIES 3). THIS LYMPH NODE IS SLOWLY INCREASED IN SIZE FROM 3 MM ON CT 12/18/2015. Reproductive: Prostate gland normal Other: No free fluid. Musculoskeletal: No aggressive osseous lesion. IMPRESSION: Chest Impression: 1. Prominent bilateral axillary lymph nodes are not changed from prior. 2. Bibasilar atelectasis unchanged. 3. No evidence of lymphoma the progression/recurrence. Abdomen / Pelvis Impression: 1. No evidence of lymphoma recurrence in the abdomen pelvis. 2. A single lymph node in the posterior central mesentery slowly increased in size from 2017. Recommend attention on follow-up. 3. Stable adrenal adenomas. Electronically Signed   By: Suzy Bouchard M.D.   On: 06/17/2017 09:54   Ct Maxillofacial W Contrast  Result Date: 06/16/2017 CLINICAL DATA:  Follicular lymphoma orbit EXAM: CT MAXILLOFACIAL WITH CONTRAST TECHNIQUE: Multidetector CT imaging of the maxillofacial  structures was performed with intravenous contrast. Multiplanar CT image reconstructions were also generated. CONTRAST:  183mL ISOVUE-300 IOPAMIDOL (ISOVUE-300) INJECTION 61% COMPARISON:  CT neck from today and 12/21/2016.  CT face 12/21/2016 FINDINGS: Osseous: Negative for mass lesion.  No  fracture or bone lesion. Orbits: Right cataract removal.  No orbital mass or edema. Sinuses: Negative Soft tissues: 8 mm right submandibular node slightly smaller compared with 12/21/2016. Right level 2 lymph node 9 mm unchanged. Left submandibular nodes 6 mm and 8 mm similar to the prior CT. 7 mm left level 2 lymph node unchanged. 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 Fetal origin right posterior cerebral artery. No intracranial mass lesion. IMPRESSION: Negative for mass lesion. Stable submandibular lymph nodes bilaterally. Electronically Signed   By: Franchot Gallo M.D.   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  Patient had recurrence in his right orbit. CT scan results from June 16, 2017 reviewed independently and reported as above with no obvious recurrent or progressive disease.  He was noted to have a mesenteric lymph node that increased from 3 mm to 7 mm in size.  No intervention is needed.  Return to clinic in 6 months with repeat imaging and further evaluation. 2.  Port: Patient continues to maintain his port and does not wish to take it out at this time.  Continue port flushes every 6 weeks.  Approximately 20 minutes was spent in discussion of which greater than 50% was consultation.  Patient expressed understanding and was in agreement with this plan. He also understands that He can call clinic at any time with any questions, concerns, or complaints.    Lloyd Huger, MD   06/23/2017 1:36 PM

## 2017-06-25 ENCOUNTER — Ambulatory Visit: Payer: Medicare Other | Admitting: Oncology

## 2017-07-16 ENCOUNTER — Inpatient Hospital Stay: Payer: Medicare Other | Attending: Oncology

## 2017-07-16 VITALS — BP 116/79 | HR 83 | Temp 97.2°F | Resp 20

## 2017-07-16 DIAGNOSIS — C8211 Follicular lymphoma grade II, lymph nodes of head, face, and neck: Secondary | ICD-10-CM | POA: Insufficient documentation

## 2017-07-16 DIAGNOSIS — Z452 Encounter for adjustment and management of vascular access device: Secondary | ICD-10-CM | POA: Diagnosis not present

## 2017-07-16 DIAGNOSIS — Z95828 Presence of other vascular implants and grafts: Secondary | ICD-10-CM

## 2017-07-16 MED ORDER — HEPARIN SOD (PORK) LOCK FLUSH 100 UNIT/ML IV SOLN
500.0000 [IU] | Freq: Once | INTRAVENOUS | Status: AC
  Administered 2017-07-16: 500 [IU] via INTRAVENOUS

## 2017-07-16 MED ORDER — SODIUM CHLORIDE 0.9% FLUSH
10.0000 mL | INTRAVENOUS | Status: DC | PRN
  Administered 2017-07-16: 10 mL via INTRAVENOUS
  Filled 2017-07-16: qty 10

## 2017-09-06 ENCOUNTER — Inpatient Hospital Stay: Payer: Medicare Other | Attending: Oncology

## 2017-09-06 VITALS — BP 125/80 | HR 87 | Temp 97.0°F | Resp 20

## 2017-09-06 DIAGNOSIS — Z95828 Presence of other vascular implants and grafts: Secondary | ICD-10-CM

## 2017-09-06 DIAGNOSIS — C8211 Follicular lymphoma grade II, lymph nodes of head, face, and neck: Secondary | ICD-10-CM | POA: Diagnosis not present

## 2017-09-06 MED ORDER — SODIUM CHLORIDE 0.9% FLUSH
10.0000 mL | INTRAVENOUS | Status: DC | PRN
  Administered 2017-09-06: 10 mL via INTRAVENOUS
  Filled 2017-09-06: qty 10

## 2017-09-06 MED ORDER — HEPARIN SOD (PORK) LOCK FLUSH 100 UNIT/ML IV SOLN
500.0000 [IU] | Freq: Once | INTRAVENOUS | Status: AC
  Administered 2017-09-06: 500 [IU] via INTRAVENOUS

## 2017-10-25 ENCOUNTER — Inpatient Hospital Stay: Payer: Medicare Other | Attending: Oncology

## 2017-10-25 DIAGNOSIS — Z95828 Presence of other vascular implants and grafts: Secondary | ICD-10-CM

## 2017-10-25 DIAGNOSIS — Z452 Encounter for adjustment and management of vascular access device: Secondary | ICD-10-CM | POA: Insufficient documentation

## 2017-10-25 DIAGNOSIS — C8211 Follicular lymphoma grade II, lymph nodes of head, face, and neck: Secondary | ICD-10-CM | POA: Diagnosis present

## 2017-10-25 MED ORDER — HEPARIN SOD (PORK) LOCK FLUSH 100 UNIT/ML IV SOLN
500.0000 [IU] | Freq: Once | INTRAVENOUS | Status: AC
  Administered 2017-10-25: 500 [IU] via INTRAVENOUS

## 2017-10-25 MED ORDER — SODIUM CHLORIDE 0.9% FLUSH
10.0000 mL | INTRAVENOUS | Status: DC | PRN
  Administered 2017-10-25: 10 mL via INTRAVENOUS
  Filled 2017-10-25: qty 10

## 2017-10-26 ENCOUNTER — Inpatient Hospital Stay: Payer: Medicare Other

## 2017-12-07 ENCOUNTER — Other Ambulatory Visit: Payer: Medicare Other

## 2017-12-08 ENCOUNTER — Inpatient Hospital Stay: Payer: Medicare Other | Attending: Oncology

## 2017-12-08 VITALS — BP 117/77 | HR 75 | Temp 96.7°F | Resp 20

## 2017-12-08 DIAGNOSIS — C8211 Follicular lymphoma grade II, lymph nodes of head, face, and neck: Secondary | ICD-10-CM | POA: Insufficient documentation

## 2017-12-08 DIAGNOSIS — C8290 Follicular lymphoma, unspecified, unspecified site: Secondary | ICD-10-CM

## 2017-12-08 DIAGNOSIS — Z95828 Presence of other vascular implants and grafts: Secondary | ICD-10-CM

## 2017-12-08 LAB — CBC WITH DIFFERENTIAL/PLATELET
BASOS ABS: 0.1 10*3/uL (ref 0–0.1)
Basophils Relative: 1 %
EOS PCT: 2 %
Eosinophils Absolute: 0.1 10*3/uL (ref 0–0.7)
HEMATOCRIT: 42.5 % (ref 40.0–52.0)
Hemoglobin: 14.1 g/dL (ref 13.0–18.0)
LYMPHS ABS: 1.2 10*3/uL (ref 1.0–3.6)
LYMPHS PCT: 20 %
MCH: 30.2 pg (ref 26.0–34.0)
MCHC: 33.3 g/dL (ref 32.0–36.0)
MCV: 90.8 fL (ref 80.0–100.0)
MONO ABS: 0.5 10*3/uL (ref 0.2–1.0)
MONOS PCT: 9 %
NEUTROS ABS: 4.1 10*3/uL (ref 1.4–6.5)
Neutrophils Relative %: 68 %
PLATELETS: 169 10*3/uL (ref 150–440)
RBC: 4.69 MIL/uL (ref 4.40–5.90)
RDW: 13.2 % (ref 11.5–14.5)
WBC: 6 10*3/uL (ref 3.8–10.6)

## 2017-12-08 LAB — COMPREHENSIVE METABOLIC PANEL
ALBUMIN: 4 g/dL (ref 3.5–5.0)
ALT: 19 U/L (ref 0–44)
ANION GAP: 10 (ref 5–15)
AST: 24 U/L (ref 15–41)
Alkaline Phosphatase: 64 U/L (ref 38–126)
BUN: 11 mg/dL (ref 8–23)
CHLORIDE: 106 mmol/L (ref 98–111)
CO2: 25 mmol/L (ref 22–32)
Calcium: 8.6 mg/dL — ABNORMAL LOW (ref 8.9–10.3)
Creatinine, Ser: 0.88 mg/dL (ref 0.61–1.24)
GFR calc Af Amer: >60 mL/min (ref >60)
GFR calc non Af Amer: >60 mL/min (ref >60)
GLUCOSE: 103 mg/dL — AB (ref 70–99)
POTASSIUM: 3.8 mmol/L (ref 3.5–5.1)
Sodium: 141 mmol/L (ref 135–145)
Total Bilirubin: 1 mg/dL (ref 0.3–1.2)
Total Protein: 6.6 g/dL (ref 6.5–8.1)

## 2017-12-08 MED ORDER — HEPARIN SOD (PORK) LOCK FLUSH 100 UNIT/ML IV SOLN
500.0000 [IU] | Freq: Once | INTRAVENOUS | Status: AC
  Administered 2017-12-08: 500 [IU] via INTRAVENOUS

## 2017-12-08 MED ORDER — SODIUM CHLORIDE 0.9% FLUSH
10.0000 mL | INTRAVENOUS | Status: DC | PRN
  Administered 2017-12-08: 10 mL via INTRAVENOUS
  Filled 2017-12-08: qty 10

## 2017-12-14 ENCOUNTER — Ambulatory Visit: Payer: Medicare Other

## 2017-12-14 ENCOUNTER — Ambulatory Visit
Admission: RE | Admit: 2017-12-14 | Discharge: 2017-12-14 | Disposition: A | Discharge status: Home / Self Care | Payer: Medicare Other | Source: Ambulatory Visit | Attending: Oncology | Admitting: Oncology

## 2017-12-14 ENCOUNTER — Other Ambulatory Visit: Payer: Medicare Other

## 2017-12-14 ENCOUNTER — Ambulatory Visit: Admission: RE | Admit: 2017-12-14 | Payer: Medicare Other | Source: Ambulatory Visit

## 2017-12-14 DIAGNOSIS — R590 Localized enlarged lymph nodes: Secondary | ICD-10-CM | POA: Diagnosis not present

## 2017-12-14 DIAGNOSIS — K118 Other diseases of salivary glands: Secondary | ICD-10-CM | POA: Insufficient documentation

## 2017-12-14 DIAGNOSIS — C8291 Follicular lymphoma, unspecified, lymph nodes of head, face, and neck: Secondary | ICD-10-CM

## 2017-12-14 MED ORDER — IOPAMIDOL (ISOVUE-300) INJECTION 61%
100.0000 mL | Freq: Once | INTRAVENOUS | Status: AC | PRN
  Administered 2017-12-14: 100 mL via INTRAVENOUS

## 2017-12-17 ENCOUNTER — Ambulatory Visit: Payer: Medicare Other | Admitting: Oncology

## 2017-12-19 NOTE — Progress Notes (Signed)
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  He continues to feel well and is asymptomatic.  His double vision, particularly in his right eye, is chronic and unchanged.  He has no neurologic complaints.  He denies any fevers, weight loss, or night sweats.  He has noted no new lymphadenopathy.  He denies any chest pain or shortness of breath.  He denies any nausea, vomiting, constipation, or diarrhea.  He has no urinary complaints.  Patient feels at his baseline offers no specific complaints today.  REVIEW OF SYSTEMS:   Review of Systems  Constitutional: Negative.  Negative for diaphoresis, fever, malaise/fatigue and weight loss.  Eyes: Positive for double vision. Negative for blurred vision and pain.  Respiratory: Negative.  Negative for cough and shortness of breath.   Cardiovascular: Negative.  Negative for chest pain and leg swelling.  Gastrointestinal: Negative.  Negative for abdominal pain.  Genitourinary: Negative.  Negative for dysuria.  Musculoskeletal: Negative.  Negative for neck pain.  Skin: Negative.  Negative for rash.  Neurological: Negative.  Negative for sensory change, focal weakness, weakness and headaches.  Psychiatric/Behavioral: Negative.  The patient is not nervous/anxious.     As per HPI. Otherwise, a complete review of systems is negative.  PAST MEDICAL HISTORY: Past Medical History:  Diagnosis Date  . Follicular lymphoma ([place]) 2009   Patient had chemo + rad tx's and zevaline shots.  Marland Kitchen GERD (gastroesophageal reflux disease)   . Hemorrhoids   . Sleep apnea     PAST SURGICAL HISTORY:  Hemorrhoidectomy.  FAMILY HISTORY: Colon cancer and lung cancer.     ADVANCED DIRECTIVES:    HEALTH MAINTENANCE: Social History   Tobacco Use  . Smoking status: Never Smoker  . Smokeless tobacco: Never Used  Substance Use Topics  . Alcohol use: Yes    Comment: occasional  . Drug use: No     No Known Allergies  Current Outpatient Medications  Medication Sig Dispense Refill  . amLODipine (NORVASC) 5 MG tablet Take 5 mg by mouth daily.     Marland Kitchen aspirin EC 81 MG tablet Take 81 mg by mouth daily.     Marland Kitchen atorvastatin (LIPITOR) 10 MG tablet Take 10 mg by mouth daily at 6 PM.     . Calcium Carbonate-Vitamin D 600-400 MG-UNIT per tablet Take 1 tablet by mouth 2 (two) times daily.     . cetirizine (ZYRTEC) 10 MG tablet Take 10 mg by mouth daily.    . Multiple Vitamin (MULTI-VITAMINS) TABS Take 1 tablet by mouth daily.     Marland Kitchen omeprazole (PRILOSEC) 40 MG capsule Take 40 mg by mouth daily.     . tadalafil (CIALIS) 20 MG tablet Take by mouth.     No current facility-administered medications for this visit.     OBJECTIVE: Vitals:   12/24/17 1425  BP: 109/71  Pulse: 87  Resp: (!) 98     Body mass index is 29.52 kg/m.    ECOG FS:0 - Asymptomatic  General: Well-developed, well-nourished, no acute distress. Eyes: Pink conjunctiva, anicteric sclera. HEENT: Normocephalic, moist mucous membranes. Lungs: Clear to auscultation bilaterally. Heart: Regular rate and rhythm. No rubs, murmurs, or gallops. Abdomen:  Soft, nontender, nondistended. No organomegaly noted, normoactive bowel sounds. Musculoskeletal: No edema, cyanosis, or clubbing. Neuro: Alert, answering all questions appropriately. Cranial nerves grossly intact. Skin: No rashes or petechiae noted. Psych: Normal affect. Lymphatics: No cervical, calvicular, axillary or inguinal LAD.  LAB RESULTS:  Lab Results  Component Value Date   NA 141 12/08/2017   K 3.8 12/08/2017   CL 106 12/08/2017   CO2 25 12/08/2017   GLUCOSE 103  (H) 12/08/2017   BUN 11 12/08/2017   CREATININE 0.88 12/08/2017   CALCIUM 8.6 (L) 12/08/2017   PROT 6.6 12/08/2017   ALBUMIN 4.0 12/08/2017   AST 24 12/08/2017   ALT 19 12/08/2017   ALKPHOS 64 12/08/2017   BILITOT 1.0 12/08/2017   GFRNONAA >60 12/08/2017   GFRAA >60 12/08/2017    Lab Results  Component Value Date   WBC 6.0 12/08/2017   NEUTROABS 4.1 12/08/2017   HGB 14.1 12/08/2017   HCT 42.5 12/08/2017   MCV 90.8 12/08/2017   PLT 169 12/08/2017     STUDIES: Ct Soft Tissue Neck W Contrast  Result Date: 12/14/2017 CLINICAL DATA:  Focal acute follicular lymphoma. Chemo and radiation completed 2014 EXAM: CT NECK WITH CONTRAST TECHNIQUE: Multidetector CT imaging of the neck was performed using the standard protocol following the bolus administration of intravenous contrast. CONTRAST:  135mL ISOVUE-300 IOPAMIDOL (ISOVUE-300) INJECTION 61% COMPARISON:  CT neck 06/16/2017 FINDINGS: Pharynx and larynx: Normal. No mass or swelling. Salivary glands: 7 mm enhancing nodule in the parotid tail on the left is unchanged. Possible parotid lesion versus lymph node. Small benign-appearing calcification in the left parotid gland. Remaining submandibular gland and right parotid glands are normal. Thyroid: Negative Lymph nodes: Bilateral cervical adenopathy. Left submandibular lymph nodes appear larger now measuring up to 10 mm. Right submandibular small nodes are stable. Left supraclavicular lymph node slightly larger now 15 x 13 mm on axial image 59. Most of the nodes are similar in size. Right level 2 node 10.5 mm. Left level 2 node 7.4 mm. Right supraclavicular lymph node with irregular margins on axial image number 59 is unchanged 13 x 25 mm. Right posterior subcutaneous node 19 x 13 mm unchanged. Bilateral level 4 lymph nodes are stable. Vascular: Persistent trigeminal artery on the left, incidental finding. Normal arterial and venous enhancement elsewhere. Limited intracranial: Negative Visualized  orbits: .  No orbital mass.  Right cataract surgery. Mastoids and visualized paranasal sinuses: Mild mucosal edema left maxillary sinus. Remaining sinuses clear Skeleton: Moderate cervical spondylosis without acute bony abnormality. Upper chest: Chest CT today reported separately Other: None IMPRESSION: Cervical lymph nodes are mostly stable however there is suggestion of mild enlargement of left submandibular and left supraclavicular lymph nodes compared to prior studies. Continued close follow-up is suggested. 7 mm enhancing nodule left parotid tail is stable. Electronically Signed   By: Franchot Gallo M.D.   On: 12/14/2017 13:17   Ct Chest W Contrast  Result Date: 12/14/2017 CLINICAL DATA:  Follow-up follicular lymphoma EXAM: CT CHEST, ABDOMEN, AND PELVIS WITH CONTRAST TECHNIQUE: Multidetector CT imaging of the chest, abdomen and pelvis was performed following the standard protocol during bolus administration of intravenous contrast. CONTRAST:  18mL ISOVUE-300 IOPAMIDOL (ISOVUE-300) INJECTION 61% COMPARISON:  06/16/2017 FINDINGS: CT CHEST FINDINGS Cardiovascular: Heart is normal in size.  No pericardial effusion. No evidence of thoracic aortic aneurysm. Mild atherosclerotic calcifications of the aortic arch. Right chest port terminates in the mid SVC. Mediastinum/Nodes: No suspicious mediastinal or hilar lymphadenopathy. 8 mm short axis high right  paratracheal node at the thoracic inlet (series 4/image 8), unchanged. Bilateral axillary lymphadenopathy, measuring 10 mm on the right and 13 mm on the left (series 4/image 11), previously 12 mm. 9 mm short axis left subpectoral node (series 4/image 9), unchanged. 8 mm short axis right subpectoral node (series 4/image 8), unchanged. Lungs/Pleura: 4 mm left upper lobe nodule (series 6/image 32), unchanged. Mild patchy opacity in the posterior left upper lobe and bilateral lower lobes, likely atelectasis, with superimposed left lower lobe scarring. Azygos fissure.  No focal consolidation. No pleural effusion or pneumothorax. Musculoskeletal: Degenerative changes of the thoracic spine. CT ABDOMEN PELVIS FINDINGS Hepatobiliary: Liver is within normal limits. Layering 4 mm gallstone (series 4/image 56), without associated inflammatory changes. Pancreas: Within normal limits. Spleen: Spleen is normal in size. Adrenals/Urinary Tract: Bilateral adrenal adenomas, measuring 2.2 cm on the right and 2.8 cm on the left, benign. Small bilateral renal cysts, measuring up to 11 mm in the anterior left upper kidney. No hydronephrosis. Bladder is within normal limits. Stomach/Bowel: Stomach is within normal limits. No evidence of bowel obstruction. Normal appendix (series 4/image 86). Sigmoid diverticulosis, without evidence of diverticulitis. Vascular/Lymphatic: No evidence of abdominal aortic aneurysm. Atherosclerotic calcifications of the abdominal aorta and branch vessels. No suspicious abdominopelvic lymphadenopathy. 8 mm short axis node along the central jejunal mesentery (series 4/image 80), previously 7 mm. Reproductive: Prostatomegaly, with enlargement the central gland, indenting the base of the bladder. Other: No abdominopelvic ascites. Tiny fat containing periumbilical hernia. Musculoskeletal: Degenerative changes of the lumbar spine. IMPRESSION: Mildly prominent thoracic nodes, including axillary lymphadenopathy, as described above. 8 mm short axis node in the central abdominal mesentery, grossly unchanged. Spleen is normal in size. Additional stable ancillary findings as above. Electronically Signed   By: Julian Hy M.D.   On: 12/14/2017 14:42   Ct Abdomen Pelvis W Contrast  Result Date: 12/14/2017 CLINICAL DATA:  Follow-up follicular lymphoma EXAM: CT CHEST, ABDOMEN, AND PELVIS WITH CONTRAST TECHNIQUE: Multidetector CT imaging of the chest, abdomen and pelvis was performed following the standard protocol during bolus administration of intravenous contrast. CONTRAST:   162mL ISOVUE-300 IOPAMIDOL (ISOVUE-300) INJECTION 61% COMPARISON:  06/16/2017 FINDINGS: CT CHEST FINDINGS Cardiovascular: Heart is normal in size.  No pericardial effusion. No evidence of thoracic aortic aneurysm. Mild atherosclerotic calcifications of the aortic arch. Right chest port terminates in the mid SVC. Mediastinum/Nodes: No suspicious mediastinal or hilar lymphadenopathy. 8 mm short axis high right paratracheal node at the thoracic inlet (series 4/image 8), unchanged. Bilateral axillary lymphadenopathy, measuring 10 mm on the right and 13 mm on the left (series 4/image 11), previously 12 mm. 9 mm short axis left subpectoral node (series 4/image 9), unchanged. 8 mm short axis right subpectoral node (series 4/image 8), unchanged. Lungs/Pleura: 4 mm left upper lobe nodule (series 6/image 32), unchanged. Mild patchy opacity in the posterior left upper lobe and bilateral lower lobes, likely atelectasis, with superimposed left lower lobe scarring. Azygos fissure. No focal consolidation. No pleural effusion or pneumothorax. Musculoskeletal: Degenerative changes of the thoracic spine. CT ABDOMEN PELVIS FINDINGS Hepatobiliary: Liver is within normal limits. Layering 4 mm gallstone (series 4/image 56), without associated inflammatory changes. Pancreas: Within normal limits. Spleen: Spleen is normal in size. Adrenals/Urinary Tract: Bilateral adrenal adenomas, measuring 2.2 cm on the right and 2.8 cm on the left, benign. Small bilateral renal cysts, measuring up to 11 mm in the anterior left upper kidney. No hydronephrosis. Bladder is within normal limits. Stomach/Bowel: Stomach is within normal limits. No evidence of bowel obstruction.  Normal appendix (series 4/image 86). Sigmoid diverticulosis, without evidence of diverticulitis. Vascular/Lymphatic: No evidence of abdominal aortic aneurysm. Atherosclerotic calcifications of the abdominal aorta and branch vessels. No suspicious abdominopelvic lymphadenopathy. 8 mm  short axis node along the central jejunal mesentery (series 4/image 80), previously 7 mm. Reproductive: Prostatomegaly, with enlargement the central gland, indenting the base of the bladder. Other: No abdominopelvic ascites. Tiny fat containing periumbilical hernia. Musculoskeletal: Degenerative changes of the lumbar spine. IMPRESSION: Mildly prominent thoracic nodes, including axillary lymphadenopathy, as described above. 8 mm short axis node in the central abdominal mesentery, grossly unchanged. Spleen is normal in size. Additional stable ancillary findings as above. Electronically Signed   By: Julian Hy M.D.   On: 12/14/2017 14:42   Ct Maxillofacial W Contrast  Result Date: 12/14/2017 CLINICAL DATA:  History of follicular lymphoma right orbit EXAM: CT MAXILLOFACIAL WITH CONTRAST TECHNIQUE: Multidetector CT imaging of the maxillofacial structures was performed with intravenous contrast. Multiplanar CT image reconstructions were also generated. CONTRAST:  168mL ISOVUE-300 IOPAMIDOL (ISOVUE-300) INJECTION 61% COMPARISON:  CT face 06/16/2017 FINDINGS: Osseous: No bony lesion. Orbits: Right cataract surgery.  No orbital mass. Sinuses: Moderate mucosal thickening left maxillary sinus has developed since the prior study. Mild mucosal edema right maxillary sinus has also developed since prior study. Moderate mucosal edema ethmoid sinuses is new. Sphenoid and frontal sinuses clear. Mastoid sinus clear bilaterally. Soft tissues: Right submandibular node 6.5 mm unchanged. Left submandibular node 10.5 mm slightly larger compared to the prior study when it measured 8.4 mm. Adjacent left submandibular node measures 7.8 mm and is slightly larger, previously 6.0 mm. 5.7 mm left submandibular lymph node is unchanged. Right level 2 lymph node 9.4 mm unchanged. Left level 2 node 8.9 mm unchanged. Left level 3 lymph node anterior to the jugular vein 6.9 mm unchanged. Pharyngeal soft tissues normal. Limited intracranial:  Persistent trigeminal artery on the left, a normal variant. No acute intracranial abnormality. IMPRESSION: Mild progression of left submandibular lymph nodes compared to prior studies. Other lymph nodes in the right submandibular and bilateral level 2 stations are unchanged from the prior study. Electronically Signed   By: Franchot Gallo M.D.   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  Patient had recurrence in his right orbit.  CT scan results from December 14, 2017 reviewed independently and reported as above with mild progression of 1 to 2 mm in submandibular nodes.  The remainder of his CT scans did not reveal any suggestion of progression of disease.  No intervention is needed at this time.  Return to clinic in 1 year with repeat laboratory work and imaging with follow-up 1 to 2 days later.  2.  Port: Patient continues to maintain his port and does not wish to take it out at this time.  Continue port flushes every 6 weeks.  I spent a total of 20 minutes face-to-face with the patient of which greater than 50% of the visit was spent in counseling and coordination of care as detailed above.   Patient expressed understanding and was in agreement with this plan. He also understands that He can call clinic at any time with any questions, concerns, or complaints.    Lloyd Huger, MD   12/24/2017 4:40 PM

## 2017-12-21 ENCOUNTER — Other Ambulatory Visit: Payer: Medicare Other

## 2017-12-21 ENCOUNTER — Ambulatory Visit: Payer: Medicare Other | Admitting: Oncology

## 2017-12-24 ENCOUNTER — Inpatient Hospital Stay (HOSPITAL_BASED_OUTPATIENT_CLINIC_OR_DEPARTMENT_OTHER): Payer: Medicare Other | Admitting: Oncology

## 2017-12-24 ENCOUNTER — Encounter: Payer: Self-pay | Admitting: Oncology

## 2017-12-24 VITALS — BP 109/71 | HR 87 | Resp 98 | Wt 211.6 lb

## 2017-12-24 DIAGNOSIS — C8211 Follicular lymphoma grade II, lymph nodes of head, face, and neck: Secondary | ICD-10-CM | POA: Diagnosis not present

## 2017-12-24 NOTE — Progress Notes (Signed)
Patient here today for results, denies any concerns.

## 2018-01-28 ENCOUNTER — Inpatient Hospital Stay: Payer: Medicare Other | Attending: Oncology

## 2018-01-28 DIAGNOSIS — C8211 Follicular lymphoma grade II, lymph nodes of head, face, and neck: Secondary | ICD-10-CM | POA: Diagnosis present

## 2018-01-28 DIAGNOSIS — Z452 Encounter for adjustment and management of vascular access device: Secondary | ICD-10-CM | POA: Insufficient documentation

## 2018-01-28 DIAGNOSIS — Z95828 Presence of other vascular implants and grafts: Secondary | ICD-10-CM

## 2018-01-28 MED ORDER — SODIUM CHLORIDE 0.9% FLUSH
10.0000 mL | INTRAVENOUS | Status: DC | PRN
  Administered 2018-01-28: 10 mL via INTRAVENOUS
  Filled 2018-01-28: qty 10

## 2018-01-28 MED ORDER — HEPARIN SOD (PORK) LOCK FLUSH 100 UNIT/ML IV SOLN
500.0000 [IU] | Freq: Once | INTRAVENOUS | Status: AC
  Administered 2018-01-28: 500 [IU] via INTRAVENOUS
  Filled 2018-01-28: qty 5

## 2018-02-11 ENCOUNTER — Inpatient Hospital Stay: Payer: Medicare Other

## 2018-03-17 ENCOUNTER — Inpatient Hospital Stay: Payer: Medicare Other | Attending: Oncology

## 2018-03-17 DIAGNOSIS — Z452 Encounter for adjustment and management of vascular access device: Secondary | ICD-10-CM | POA: Insufficient documentation

## 2018-03-17 DIAGNOSIS — Z95828 Presence of other vascular implants and grafts: Secondary | ICD-10-CM

## 2018-03-17 DIAGNOSIS — C8211 Follicular lymphoma grade II, lymph nodes of head, face, and neck: Secondary | ICD-10-CM | POA: Insufficient documentation

## 2018-03-17 MED ORDER — HEPARIN SOD (PORK) LOCK FLUSH 100 UNIT/ML IV SOLN
INTRAVENOUS | Status: AC
  Filled 2018-03-17: qty 5

## 2018-03-17 MED ORDER — SODIUM CHLORIDE 0.9% FLUSH
10.0000 mL | INTRAVENOUS | Status: DC | PRN
  Administered 2018-03-17: 10 mL via INTRAVENOUS
  Filled 2018-03-17: qty 10

## 2018-03-17 MED ORDER — HEPARIN SOD (PORK) LOCK FLUSH 100 UNIT/ML IV SOLN
500.0000 [IU] | Freq: Once | INTRAVENOUS | Status: AC
  Administered 2018-03-17: 500 [IU] via INTRAVENOUS

## 2018-03-25 ENCOUNTER — Inpatient Hospital Stay: Payer: Medicare Other

## 2018-05-06 ENCOUNTER — Inpatient Hospital Stay: Payer: Medicare Other | Attending: Oncology

## 2018-05-06 DIAGNOSIS — Z95828 Presence of other vascular implants and grafts: Secondary | ICD-10-CM

## 2018-05-06 DIAGNOSIS — Z452 Encounter for adjustment and management of vascular access device: Secondary | ICD-10-CM | POA: Diagnosis present

## 2018-05-06 DIAGNOSIS — C8211 Follicular lymphoma grade II, lymph nodes of head, face, and neck: Secondary | ICD-10-CM | POA: Insufficient documentation

## 2018-05-06 MED ORDER — HEPARIN SOD (PORK) LOCK FLUSH 100 UNIT/ML IV SOLN
500.0000 [IU] | Freq: Once | INTRAVENOUS | Status: AC
  Administered 2018-05-06: 500 [IU] via INTRAVENOUS

## 2018-05-06 MED ORDER — SODIUM CHLORIDE 0.9% FLUSH
10.0000 mL | INTRAVENOUS | Status: DC | PRN
  Administered 2018-05-06: 10 mL via INTRAVENOUS
  Filled 2018-05-06: qty 10

## 2018-06-17 ENCOUNTER — Inpatient Hospital Stay: Payer: Medicare Other | Attending: Oncology

## 2018-06-17 ENCOUNTER — Other Ambulatory Visit: Payer: Self-pay

## 2018-06-17 DIAGNOSIS — C8211 Follicular lymphoma grade II, lymph nodes of head, face, and neck: Secondary | ICD-10-CM | POA: Diagnosis present

## 2018-06-17 DIAGNOSIS — Z95828 Presence of other vascular implants and grafts: Secondary | ICD-10-CM

## 2018-06-17 DIAGNOSIS — Z452 Encounter for adjustment and management of vascular access device: Secondary | ICD-10-CM | POA: Diagnosis present

## 2018-06-17 MED ORDER — HEPARIN SOD (PORK) LOCK FLUSH 100 UNIT/ML IV SOLN
500.0000 [IU] | Freq: Once | INTRAVENOUS | Status: AC
  Administered 2018-06-17: 500 [IU] via INTRAVENOUS

## 2018-06-17 MED ORDER — SODIUM CHLORIDE 0.9% FLUSH
10.0000 mL | INTRAVENOUS | Status: DC | PRN
  Administered 2018-06-17: 10 mL via INTRAVENOUS
  Filled 2018-06-17: qty 10

## 2018-07-28 ENCOUNTER — Other Ambulatory Visit: Payer: Self-pay

## 2018-07-29 ENCOUNTER — Inpatient Hospital Stay: Payer: Medicare Other | Attending: Oncology

## 2018-07-29 VITALS — BP 104/69 | HR 85 | Temp 96.7°F | Resp 20

## 2018-07-29 DIAGNOSIS — Z452 Encounter for adjustment and management of vascular access device: Secondary | ICD-10-CM | POA: Diagnosis present

## 2018-07-29 DIAGNOSIS — Z95828 Presence of other vascular implants and grafts: Secondary | ICD-10-CM

## 2018-07-29 DIAGNOSIS — C8211 Follicular lymphoma grade II, lymph nodes of head, face, and neck: Secondary | ICD-10-CM | POA: Insufficient documentation

## 2018-07-29 MED ORDER — HEPARIN SOD (PORK) LOCK FLUSH 100 UNIT/ML IV SOLN
500.0000 [IU] | Freq: Once | INTRAVENOUS | Status: AC
  Administered 2018-07-29: 14:00:00 500 [IU] via INTRAVENOUS

## 2018-07-29 MED ORDER — SODIUM CHLORIDE 0.9% FLUSH
10.0000 mL | INTRAVENOUS | Status: DC | PRN
  Administered 2018-07-29: 10 mL via INTRAVENOUS
  Filled 2018-07-29: qty 10

## 2018-09-06 ENCOUNTER — Other Ambulatory Visit: Payer: Self-pay

## 2018-09-07 ENCOUNTER — Inpatient Hospital Stay: Payer: Medicare Other | Attending: Hematology and Oncology

## 2018-09-07 VITALS — BP 127/81 | HR 78 | Temp 98.0°F | Resp 20

## 2018-09-07 DIAGNOSIS — C8211 Follicular lymphoma grade II, lymph nodes of head, face, and neck: Secondary | ICD-10-CM | POA: Insufficient documentation

## 2018-09-07 DIAGNOSIS — Z95828 Presence of other vascular implants and grafts: Secondary | ICD-10-CM

## 2018-09-07 DIAGNOSIS — Z452 Encounter for adjustment and management of vascular access device: Secondary | ICD-10-CM | POA: Insufficient documentation

## 2018-09-07 MED ORDER — HEPARIN SOD (PORK) LOCK FLUSH 100 UNIT/ML IV SOLN
500.0000 [IU] | Freq: Once | INTRAVENOUS | Status: AC
  Administered 2018-09-07: 500 [IU] via INTRAVENOUS

## 2018-09-07 MED ORDER — SODIUM CHLORIDE 0.9% FLUSH
10.0000 mL | INTRAVENOUS | Status: DC | PRN
  Administered 2018-09-07: 10 mL via INTRAVENOUS
  Filled 2018-09-07: qty 10

## 2018-09-09 ENCOUNTER — Inpatient Hospital Stay: Payer: Medicare Other

## 2018-10-21 ENCOUNTER — Other Ambulatory Visit: Payer: Self-pay

## 2018-10-21 ENCOUNTER — Inpatient Hospital Stay: Payer: Medicare Other | Attending: Oncology

## 2018-10-21 DIAGNOSIS — Z452 Encounter for adjustment and management of vascular access device: Secondary | ICD-10-CM | POA: Insufficient documentation

## 2018-10-21 DIAGNOSIS — C8211 Follicular lymphoma grade II, lymph nodes of head, face, and neck: Secondary | ICD-10-CM | POA: Diagnosis present

## 2018-10-21 DIAGNOSIS — Z95828 Presence of other vascular implants and grafts: Secondary | ICD-10-CM

## 2018-10-21 MED ORDER — HEPARIN SOD (PORK) LOCK FLUSH 100 UNIT/ML IV SOLN
500.0000 [IU] | Freq: Once | INTRAVENOUS | Status: AC
  Administered 2018-10-21: 500 [IU] via INTRAVENOUS

## 2018-10-21 MED ORDER — SODIUM CHLORIDE 0.9% FLUSH
10.0000 mL | INTRAVENOUS | Status: DC | PRN
  Administered 2018-10-21: 10 mL via INTRAVENOUS
  Filled 2018-10-21: qty 10

## 2018-12-09 ENCOUNTER — Inpatient Hospital Stay: Payer: Medicare Other | Attending: Hematology and Oncology

## 2018-12-09 ENCOUNTER — Other Ambulatory Visit: Payer: Self-pay

## 2018-12-09 DIAGNOSIS — Z9221 Personal history of antineoplastic chemotherapy: Secondary | ICD-10-CM | POA: Insufficient documentation

## 2018-12-09 DIAGNOSIS — H532 Diplopia: Secondary | ICD-10-CM | POA: Insufficient documentation

## 2018-12-09 DIAGNOSIS — Z801 Family history of malignant neoplasm of trachea, bronchus and lung: Secondary | ICD-10-CM | POA: Insufficient documentation

## 2018-12-09 DIAGNOSIS — C8211 Follicular lymphoma grade II, lymph nodes of head, face, and neck: Secondary | ICD-10-CM | POA: Diagnosis present

## 2018-12-09 DIAGNOSIS — Z800 Family history of malignant neoplasm of digestive organs: Secondary | ICD-10-CM | POA: Diagnosis not present

## 2018-12-09 LAB — CBC WITH DIFFERENTIAL/PLATELET
Abs Immature Granulocytes: 0.02 10*3/uL (ref 0.00–0.07)
Basophils Absolute: 0.1 10*3/uL (ref 0.0–0.1)
Basophils Relative: 1 %
Eosinophils Absolute: 0.1 10*3/uL (ref 0.0–0.5)
Eosinophils Relative: 1 %
HCT: 39.8 % (ref 39.0–52.0)
Hemoglobin: 13.5 g/dL (ref 13.0–17.0)
Immature Granulocytes: 0 %
Lymphocytes Relative: 24 %
Lymphs Abs: 1.4 10*3/uL (ref 0.7–4.0)
MCH: 29.8 pg (ref 26.0–34.0)
MCHC: 33.9 g/dL (ref 30.0–36.0)
MCV: 87.9 fL (ref 80.0–100.0)
Monocytes Absolute: 0.5 10*3/uL (ref 0.1–1.0)
Monocytes Relative: 8 %
Neutro Abs: 3.9 10*3/uL (ref 1.7–7.7)
Neutrophils Relative %: 66 %
Platelets: 145 10*3/uL — ABNORMAL LOW (ref 150–400)
RBC: 4.53 MIL/uL (ref 4.22–5.81)
RDW: 13.3 % (ref 11.5–15.5)
WBC: 5.9 10*3/uL (ref 4.0–10.5)
nRBC: 0 % (ref 0.0–0.2)

## 2018-12-09 LAB — COMPREHENSIVE METABOLIC PANEL
ALT: 18 U/L (ref 0–44)
AST: 22 U/L (ref 15–41)
Albumin: 3.9 g/dL (ref 3.5–5.0)
Alkaline Phosphatase: 59 U/L (ref 38–126)
Anion gap: 8 (ref 5–15)
BUN: 10 mg/dL (ref 8–23)
CO2: 25 mmol/L (ref 22–32)
Calcium: 8.5 mg/dL — ABNORMAL LOW (ref 8.9–10.3)
Chloride: 106 mmol/L (ref 98–111)
Creatinine, Ser: 1.03 mg/dL (ref 0.61–1.24)
GFR calc Af Amer: >60 mL/min (ref >60)
GFR calc non Af Amer: >60 mL/min (ref >60)
Glucose, Bld: 90 mg/dL (ref 70–99)
Potassium: 3.6 mmol/L (ref 3.5–5.1)
Sodium: 139 mmol/L (ref 135–145)
Total Bilirubin: 0.9 mg/dL (ref 0.3–1.2)
Total Protein: 6.2 g/dL — ABNORMAL LOW (ref 6.5–8.1)

## 2018-12-09 LAB — LACTATE DEHYDROGENASE: LDH: 139 U/L (ref 98–192)

## 2018-12-09 MED ORDER — HEPARIN SOD (PORK) LOCK FLUSH 100 UNIT/ML IV SOLN
500.0000 [IU] | Freq: Once | INTRAVENOUS | Status: AC
  Administered 2018-12-09: 500 [IU] via INTRAVENOUS

## 2018-12-09 MED ORDER — SODIUM CHLORIDE 0.9% FLUSH
10.0000 mL | INTRAVENOUS | Status: DC | PRN
  Administered 2018-12-09: 10 mL via INTRAVENOUS
  Filled 2018-12-09: qty 10

## 2018-12-16 ENCOUNTER — Other Ambulatory Visit: Payer: Medicare Other

## 2018-12-23 ENCOUNTER — Ambulatory Visit
Admission: RE | Admit: 2018-12-23 | Discharge: 2018-12-23 | Disposition: A | Discharge status: Home / Self Care | Payer: Medicare Other | Source: Ambulatory Visit | Attending: Oncology | Admitting: Oncology

## 2018-12-23 ENCOUNTER — Other Ambulatory Visit: Payer: Self-pay

## 2018-12-23 DIAGNOSIS — C8211 Follicular lymphoma grade II, lymph nodes of head, face, and neck: Secondary | ICD-10-CM | POA: Diagnosis present

## 2018-12-23 MED ORDER — IOHEXOL 300 MG/ML  SOLN
100.0000 mL | Freq: Once | INTRAMUSCULAR | Status: AC | PRN
  Administered 2018-12-23: 100 mL via INTRAVENOUS

## 2018-12-23 NOTE — Progress Notes (Signed)
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  He continues to feel well and is asymptomatic.  He continues to have chronic double vision, worse in his right eye. He has no neurologic complaints.  He denies any fevers, weight loss, or night sweats.  He has noted no new lymphadenopathy.  He denies any chest pain, shortness of breath, cough, or hemoptysis.  He denies any nausea, vomiting, constipation, or diarrhea.  He has no urinary complaints.  Patient feels at his baseline offers no specific complaints today.  REVIEW OF SYSTEMS:   Review of Systems  Constitutional: Negative.  Negative for diaphoresis, fever, malaise/fatigue and weight loss.  Eyes: Positive for double vision. Negative for blurred vision and pain.  Respiratory: Negative.  Negative for cough and shortness of breath.   Cardiovascular: Negative.  Negative for chest pain and leg swelling.  Gastrointestinal: Negative.  Negative for abdominal pain.  Genitourinary: Negative.  Negative for dysuria.  Musculoskeletal: Negative.  Negative for neck pain.  Skin: Negative.  Negative for rash.  Neurological: Negative.  Negative for sensory change, focal weakness, weakness and headaches.  Psychiatric/Behavioral: Negative.  The patient is not nervous/anxious.     As per HPI. Otherwise, a complete review of systems is negative.  PAST MEDICAL HISTORY: Past Medical History:  Diagnosis Date  . Follicular lymphoma ([place]) 2009   Patient had chemo + rad tx's and zevaline shots.  Marland Kitchen GERD (gastroesophageal reflux disease)   . Hemorrhoids   . Sleep apnea     PAST  SURGICAL HISTORY: Hemorrhoidectomy.  FAMILY HISTORY: Colon cancer and lung cancer.     ADVANCED DIRECTIVES:    HEALTH MAINTENANCE: Social History   Tobacco Use  . Smoking status: Never Smoker  . Smokeless tobacco: Never Used  Substance Use Topics  . Alcohol use: Yes    Comment: occasional  . Drug use: No     Allergies  Allergen Reactions  . Tape Rash    Current Outpatient Medications  Medication Sig Dispense Refill  . amLODipine (NORVASC) 5 MG tablet Take 5 mg by mouth daily.     Marland Kitchen aspirin EC 81 MG tablet Take 81 mg by mouth daily.     Marland Kitchen atorvastatin (LIPITOR) 10 MG tablet Take 10 mg by mouth daily at 6 PM.     . Calcium Carbonate-Vitamin D 600-400 MG-UNIT per tablet Take 1 tablet by mouth 2 (two) times daily.     . cetirizine (ZYRTEC) 10 MG tablet Take 10 mg by mouth daily.    . Multiple Vitamin (MULTI-VITAMINS) TABS Take 1 tablet by mouth daily.     Marland Kitchen MYRBETRIQ 50 MG TB24 tablet Take 50 mg by mouth daily.    Marland Kitchen omeprazole (PRILOSEC) 40 MG capsule Take 40 mg by mouth daily.     . tadalafil (CIALIS) 20 MG tablet Take by mouth.    . tamsulosin (FLOMAX) 0.4 MG CAPS capsule Take 1 capsule by mouth daily.     No current facility-administered medications for this visit.     OBJECTIVE: Vitals:   12/30/18 1409  BP: 131/82  Pulse: 70  Temp: 98.1 F (36.7 C)     Body mass index is  30.55 kg/m.    ECOG FS:0 - Asymptomatic  General: Well-developed, well-nourished, no acute distress. Eyes: Pink conjunctiva, anicteric sclera. HEENT: Normocephalic, moist mucous membranes, clear oropharnyx. Lungs: Clear to auscultation bilaterally. Heart: Regular rate and rhythm. No rubs, murmurs, or gallops. Abdomen: Soft, nontender, nondistended. No organomegaly noted, normoactive bowel sounds. Musculoskeletal: No edema, cyanosis, or clubbing. Neuro: Alert, answering all questions appropriately. Cranial nerves grossly intact. Skin: No rashes or petechiae noted. Psych: Normal affect.  Lymphatics: No cervical, calvicular, axillary or inguinal LAD.  LAB RESULTS:  Lab Results  Component Value Date   NA 139 12/09/2018   K 3.6 12/09/2018   CL 106 12/09/2018   CO2 25 12/09/2018   GLUCOSE 90 12/09/2018   BUN 10 12/09/2018   CREATININE 1.03 12/09/2018   CALCIUM 8.5 (L) 12/09/2018   PROT 6.2 (L) 12/09/2018   ALBUMIN 3.9 12/09/2018   AST 22 12/09/2018   ALT 18 12/09/2018   ALKPHOS 59 12/09/2018   BILITOT 0.9 12/09/2018   GFRNONAA >60 12/09/2018   GFRAA >60 12/09/2018    Lab Results  Component Value Date   WBC 5.9 12/09/2018   NEUTROABS 3.9 12/09/2018   HGB 13.5 12/09/2018   HCT 39.8 12/09/2018   MCV 87.9 12/09/2018   PLT 145 (L) 12/09/2018     STUDIES: Ct Soft Tissue Neck W Contrast  Result Date: 12/23/2018 CLINICAL DATA:  Follow-up follicular lymphoma. EXAM: CT NECK WITH CONTRAST TECHNIQUE: Multidetector CT imaging of the neck was performed using the standard protocol following the bolus administration of intravenous contrast. CONTRAST:  175mL OMNIPAQUE IOHEXOL 300 MG/ML  SOLN COMPARISON:  12/14/2017 FINDINGS: Pharynx and larynx: No mucosal or submucosal lesion. Salivary glands: Parotid and submandibular glands are unchanged. Few small intraparotid lymph nodes as seen previously. Thyroid: Normal Lymph nodes: Lymph nodes within the neck, supraclavicular and axillary regions appear very similar to the previous examination. Multiple nodes are measured on both sides of the neck and in the supraclavicular region. Many of the nodes are 1 or 2 mm smaller and a few of the nodes are 1 or 2 mm larger. There is no definite progressive or worrisome change when taken in entirety. Indistinct mass in the posterior fat at the junction of the neck in the shoulder on the right is also smaller, 10 x 20 mm today compared with 14 x 20 mm previously. Vascular: Abnormal vascular finding. Limited intracranial: Normal Visualized orbits: Normal Mastoids and visualized paranasal sinuses:  Clear. Resolution of left maxillary sinus inflammatory changes seen last year. Skeleton: Ordinary cervical spondylosis. Upper chest: Lung apices are clear. Incidental azygos fissure on the right. Other: None IMPRESSION: No progressive disease when viewed in entirety compared to the study of last year. There are multiple mildly enlarged cervical, supraclavicular and axillary lymph nodes but none show dramatic change. A few of the nodes are 1 or 2 mm smaller and a few are 1 or 2 mm larger, but there is no likely significant disease progression. Electronically Signed   By: Nelson Chimes M.D.   On: 12/23/2018 15:23   Ct Chest W Contrast  Result Date: 12/23/2018 CLINICAL DATA:  Grade 2 follicular lymphoma of the right orbit status post chemotherapy, with recurrence. Restaging. EXAM: CT CHEST, ABDOMEN, AND PELVIS WITH CONTRAST TECHNIQUE: Multidetector CT imaging of the chest, abdomen and pelvis was performed following the standard protocol during bolus administration of intravenous contrast. CONTRAST:  140mL OMNIPAQUE IOHEXOL 300 MG/ML  SOLN COMPARISON:  12/14/2017 CT chest, abdomen and pelvis. FINDINGS: CT CHEST FINDINGS  Cardiovascular: Normal heart size. No significant pericardial effusion/thickening. Right subclavian Port-A-Cath terminates in the lower third of the SVC. Mildly atherosclerotic nonaneurysmal thoracic aorta. Normal caliber pulmonary arteries. No central pulmonary emboli. Mediastinum/Nodes: No discrete thyroid nodules. Unremarkable esophagus. Mild bilateral axillary lymphadenopathy, mildly increased on the left since 12/14/2017 CT and stable in the right. For example, 1.3 cm left axillary node (series 6/image 22), increased from 1.0 cm. Right axillary 1.0 cm node (series 6/image 12), previously 1.0 cm. Newly mildly enlarged 1.0 cm right paratracheal node (series 6/image 12), previously 0.8 cm. No additional pathologically enlarged mediastinal nodes. No hilar adenopathy. Lungs/Pleura: No pneumothorax. No  pleural effusion. No acute consolidative airspace disease or lung masses. Left upper lobe 4 mm solid pulmonary nodule (series 8/image 30) is stable and presumably benign. No new significant pulmonary nodules. Thick parenchymal band at the left lung base is unchanged and compatible with nonspecific postinfectious/postinflammatory scarring. Musculoskeletal: No aggressive appearing focal osseous lesions. Mild thoracic spondylosis. CT ABDOMEN PELVIS FINDINGS Hepatobiliary: Normal liver size. Subcentimeter hypodense posteroinferior right liver lesion is too small to characterize and is unchanged. Subcentimeter hyperdense focus in the posterior right liver lobe (series 6/image 51) is too small to characterize and is unchanged, presumably benign. No new liver lesions. Cholelithiasis. No biliary ductal dilatation. Pancreas: Normal, with no mass or duct dilation. Spleen: Normal size spleen. Subcentimeter hypodense superior splenic lesion is too small to characterize and not appreciably changed. No new splenic lesions. Adrenals/Urinary Tract: Right adrenal 1.8 cm nodule with density 34 HU, stable in size, most compatible with a benign adenoma. Left adrenal 2.8 cm nodule with density 34 HU, stable in size, also most compatible with a benign adenoma. No hydronephrosis. Scattered subcentimeter hypodense renal cortical lesions in both kidneys are too small to characterize and are unchanged, considered benign. No new renal lesions. Mild diffuse bladder wall thickening with small superior bladder diverticulum, unchanged. Stomach/Bowel: Normal non-distended stomach. Normal caliber small bowel with no small bowel wall thickening. Normal appendix. Oral contrast transits to the rectum. Marked sigmoid diverticulosis, with no large bowel wall thickening or acute pericolonic fat stranding. Vascular/Lymphatic: Mildly atherosclerotic nonaneurysmal abdominal aorta. Patent portal, splenic, hepatic and renal veins. Newly mildly enlarged 1.0 cm  right inguinal node (series 6/image 125). Newly mildly enlarged 1.0 cm left external iliac node (series 6/image 115). Newly mildly enlarged left central mesenteric 1.3 cm node (series 6/image 79), previously 0.9 cm. Stable haziness of central mesenteric fat. Reproductive: Moderate prostatomegaly. Nonspecific coarse internal prostatic calcifications. Other: No pneumoperitoneum, ascites or focal fluid collection. Small umbilical hernia with fat and small amount of fluid, slightly increased in size. Musculoskeletal: No aggressive appearing focal osseous lesions. Moderate lumbar spondylosis. Chronic mild L4 vertebral compression fracture. IMPRESSION: 1. Mild progression of lymphoma in the chest, abdomen and pelvis. Left axillary lymphadenopathy is mildly increased. New mild mediastinal lymphadenopathy. New mild left central mesenteric, right inguinal and left external iliac lymphadenopathy. 2. Spleen remains normal in size. 3. Chronic findings include: Aortic Atherosclerosis (ICD10-I70.0). Cholelithiasis. Stable bilateral adrenal adenomas. Chronic mild diffuse bladder wall thickening with small bladder diverticulum, probably due to chronic bladder outlet obstruction by the moderately enlarged prostate. Marked sigmoid diverticulosis. Small umbilical hernia with internal fat and fluid. Electronically Signed   By: Ilona Sorrel M.D.   On: 12/23/2018 12:27   Ct Abdomen Pelvis W Contrast  Result Date: 12/23/2018 CLINICAL DATA:  Grade 2 follicular lymphoma of the right orbit status post chemotherapy, with recurrence. Restaging. EXAM: CT CHEST, ABDOMEN, AND PELVIS WITH CONTRAST TECHNIQUE:  Multidetector CT imaging of the chest, abdomen and pelvis was performed following the standard protocol during bolus administration of intravenous contrast. CONTRAST:  152mL OMNIPAQUE IOHEXOL 300 MG/ML  SOLN COMPARISON:  12/14/2017 CT chest, abdomen and pelvis. FINDINGS: CT CHEST FINDINGS Cardiovascular: Normal heart size. No significant  pericardial effusion/thickening. Right subclavian Port-A-Cath terminates in the lower third of the SVC. Mildly atherosclerotic nonaneurysmal thoracic aorta. Normal caliber pulmonary arteries. No central pulmonary emboli. Mediastinum/Nodes: No discrete thyroid nodules. Unremarkable esophagus. Mild bilateral axillary lymphadenopathy, mildly increased on the left since 12/14/2017 CT and stable in the right. For example, 1.3 cm left axillary node (series 6/image 22), increased from 1.0 cm. Right axillary 1.0 cm node (series 6/image 12), previously 1.0 cm. Newly mildly enlarged 1.0 cm right paratracheal node (series 6/image 12), previously 0.8 cm. No additional pathologically enlarged mediastinal nodes. No hilar adenopathy. Lungs/Pleura: No pneumothorax. No pleural effusion. No acute consolidative airspace disease or lung masses. Left upper lobe 4 mm solid pulmonary nodule (series 8/image 30) is stable and presumably benign. No new significant pulmonary nodules. Thick parenchymal band at the left lung base is unchanged and compatible with nonspecific postinfectious/postinflammatory scarring. Musculoskeletal: No aggressive appearing focal osseous lesions. Mild thoracic spondylosis. CT ABDOMEN PELVIS FINDINGS Hepatobiliary: Normal liver size. Subcentimeter hypodense posteroinferior right liver lesion is too small to characterize and is unchanged. Subcentimeter hyperdense focus in the posterior right liver lobe (series 6/image 51) is too small to characterize and is unchanged, presumably benign. No new liver lesions. Cholelithiasis. No biliary ductal dilatation. Pancreas: Normal, with no mass or duct dilation. Spleen: Normal size spleen. Subcentimeter hypodense superior splenic lesion is too small to characterize and not appreciably changed. No new splenic lesions. Adrenals/Urinary Tract: Right adrenal 1.8 cm nodule with density 34 HU, stable in size, most compatible with a benign adenoma. Left adrenal 2.8 cm nodule with  density 34 HU, stable in size, also most compatible with a benign adenoma. No hydronephrosis. Scattered subcentimeter hypodense renal cortical lesions in both kidneys are too small to characterize and are unchanged, considered benign. No new renal lesions. Mild diffuse bladder wall thickening with small superior bladder diverticulum, unchanged. Stomach/Bowel: Normal non-distended stomach. Normal caliber small bowel with no small bowel wall thickening. Normal appendix. Oral contrast transits to the rectum. Marked sigmoid diverticulosis, with no large bowel wall thickening or acute pericolonic fat stranding. Vascular/Lymphatic: Mildly atherosclerotic nonaneurysmal abdominal aorta. Patent portal, splenic, hepatic and renal veins. Newly mildly enlarged 1.0 cm right inguinal node (series 6/image 125). Newly mildly enlarged 1.0 cm left external iliac node (series 6/image 115). Newly mildly enlarged left central mesenteric 1.3 cm node (series 6/image 79), previously 0.9 cm. Stable haziness of central mesenteric fat. Reproductive: Moderate prostatomegaly. Nonspecific coarse internal prostatic calcifications. Other: No pneumoperitoneum, ascites or focal fluid collection. Small umbilical hernia with fat and small amount of fluid, slightly increased in size. Musculoskeletal: No aggressive appearing focal osseous lesions. Moderate lumbar spondylosis. Chronic mild L4 vertebral compression fracture. IMPRESSION: 1. Mild progression of lymphoma in the chest, abdomen and pelvis. Left axillary lymphadenopathy is mildly increased. New mild mediastinal lymphadenopathy. New mild left central mesenteric, right inguinal and left external iliac lymphadenopathy. 2. Spleen remains normal in size. 3. Chronic findings include: Aortic Atherosclerosis (ICD10-I70.0). Cholelithiasis. Stable bilateral adrenal adenomas. Chronic mild diffuse bladder wall thickening with small bladder diverticulum, probably due to chronic bladder outlet obstruction by  the moderately enlarged prostate. Marked sigmoid diverticulosis. Small umbilical hernia with internal fat and fluid. Electronically Signed   By: Ilona Sorrel  M.D.   On: 12/23/2018 12:27   Ct Maxillofacial W Contrast  Result Date: 12/23/2018 CLINICAL DATA:  Restaging follicular lymphoma. EXAM: CT MAXILLOFACIAL WITH CONTRAST TECHNIQUE: Multidetector CT imaging of the maxillofacial structures was performed with intravenous contrast. Multiplanar CT image reconstructions were also generated. CONTRAST:  128mL OMNIPAQUE IOHEXOL 300 MG/ML  SOLN COMPARISON:  12/14/2017 FINDINGS: Osseous: Normal Orbits: Previous lens replacement on the right.  No orbital mass. Sinuses: Paranasal sinus is now clear. Inflammatory changes of the left maxillary sinus seen last year have resolved. Soft tissues: No new soft tissue mass lesion. Small intraparotid lymph nodes are unchanged since last year. Submandibular lymph nodes are unchanged since last year. Single largest submandibular node on the left shows short axis dimension of 8 mm. Last year this measured 9 mm. Limited intracranial: Normal IMPRESSION: Resolution of left maxillary sinus inflammatory changes. Mild nodal prominence within the parotid glands and submandibular region as seen last year. No new or progressive disease. Electronically Signed   By: Nelson Chimes M.D.   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  CT scan results from December 23, 2018 reviewed independently and reported as above with no significant progression of disease or lymphadenopathy.  No intervention is needed at this time.  Continue routine yearly evaluation with imaging until 2024 when patient is 10 years removed from his treatments.  Return to clinic in 1 year for discussion of his imaging results and further  evaluation.  2.  Port: Patient continues to maintain his port and does not wish to take it out at this time.  Continue port flushes every 6 weeks.  I spent a total of 20 minutes face-to-face with the patient of which greater than 50% of the visit was spent in counseling and coordination of care as detailed above.   Patient expressed understanding and was in agreement with this plan. He also understands that He can call clinic at any time with any questions, concerns, or complaints.    Lloyd Huger, MD   12/31/2018 8:46 AM

## 2018-12-29 ENCOUNTER — Other Ambulatory Visit: Payer: Self-pay

## 2018-12-29 ENCOUNTER — Encounter: Payer: Self-pay | Admitting: Oncology

## 2018-12-29 DIAGNOSIS — K635 Polyp of colon: Secondary | ICD-10-CM | POA: Insufficient documentation

## 2018-12-29 DIAGNOSIS — N400 Enlarged prostate without lower urinary tract symptoms: Secondary | ICD-10-CM | POA: Insufficient documentation

## 2018-12-29 NOTE — Progress Notes (Signed)
Patient stated that he had been doing well with no complaints. 

## 2018-12-30 ENCOUNTER — Ambulatory Visit: Payer: Medicare Other | Admitting: Oncology

## 2018-12-30 ENCOUNTER — Other Ambulatory Visit: Payer: Self-pay

## 2018-12-30 ENCOUNTER — Inpatient Hospital Stay (HOSPITAL_BASED_OUTPATIENT_CLINIC_OR_DEPARTMENT_OTHER): Payer: Medicare Other | Admitting: Oncology

## 2018-12-30 VITALS — BP 131/82 | HR 70 | Temp 98.1°F | Ht 70.5 in | Wt 216.0 lb

## 2018-12-30 DIAGNOSIS — C8211 Follicular lymphoma grade II, lymph nodes of head, face, and neck: Secondary | ICD-10-CM | POA: Diagnosis not present

## 2019-01-26 ENCOUNTER — Other Ambulatory Visit: Payer: Self-pay

## 2019-01-27 ENCOUNTER — Inpatient Hospital Stay: Payer: Medicare Other | Attending: Hematology and Oncology

## 2019-01-27 DIAGNOSIS — C8211 Follicular lymphoma grade II, lymph nodes of head, face, and neck: Secondary | ICD-10-CM | POA: Diagnosis present

## 2019-01-27 DIAGNOSIS — Z95828 Presence of other vascular implants and grafts: Secondary | ICD-10-CM

## 2019-01-27 DIAGNOSIS — Z452 Encounter for adjustment and management of vascular access device: Secondary | ICD-10-CM | POA: Diagnosis not present

## 2019-01-27 MED ORDER — SODIUM CHLORIDE 0.9% FLUSH
10.0000 mL | INTRAVENOUS | Status: DC | PRN
  Administered 2019-01-27: 10 mL via INTRAVENOUS
  Filled 2019-01-27: qty 10

## 2019-01-27 MED ORDER — HEPARIN SOD (PORK) LOCK FLUSH 100 UNIT/ML IV SOLN
500.0000 [IU] | Freq: Once | INTRAVENOUS | Status: AC
  Administered 2019-01-27: 500 [IU] via INTRAVENOUS

## 2019-03-24 ENCOUNTER — Other Ambulatory Visit: Payer: Self-pay

## 2019-03-24 ENCOUNTER — Inpatient Hospital Stay: Payer: Medicare Other | Attending: Oncology

## 2019-03-24 VITALS — BP 123/76 | HR 77 | Temp 96.4°F | Resp 16

## 2019-03-24 DIAGNOSIS — Z95828 Presence of other vascular implants and grafts: Secondary | ICD-10-CM

## 2019-03-24 DIAGNOSIS — C8211 Follicular lymphoma grade II, lymph nodes of head, face, and neck: Secondary | ICD-10-CM | POA: Insufficient documentation

## 2019-03-24 DIAGNOSIS — Z452 Encounter for adjustment and management of vascular access device: Secondary | ICD-10-CM | POA: Insufficient documentation

## 2019-03-24 MED ORDER — SODIUM CHLORIDE 0.9% FLUSH
10.0000 mL | INTRAVENOUS | Status: DC | PRN
  Administered 2019-03-24: 10 mL via INTRAVENOUS
  Filled 2019-03-24: qty 10

## 2019-03-24 MED ORDER — HEPARIN SOD (PORK) LOCK FLUSH 100 UNIT/ML IV SOLN
500.0000 [IU] | Freq: Once | INTRAVENOUS | Status: AC
  Administered 2019-03-24: 14:00:00 500 [IU] via INTRAVENOUS
  Filled 2019-03-24: qty 5

## 2019-05-19 ENCOUNTER — Other Ambulatory Visit: Payer: Self-pay

## 2019-05-19 ENCOUNTER — Inpatient Hospital Stay: Payer: Medicare Other | Attending: Hematology and Oncology

## 2019-05-19 DIAGNOSIS — Z8572 Personal history of non-Hodgkin lymphomas: Secondary | ICD-10-CM | POA: Insufficient documentation

## 2019-05-19 DIAGNOSIS — Z452 Encounter for adjustment and management of vascular access device: Secondary | ICD-10-CM | POA: Diagnosis not present

## 2019-05-19 DIAGNOSIS — Z95828 Presence of other vascular implants and grafts: Secondary | ICD-10-CM

## 2019-05-19 MED ORDER — HEPARIN SOD (PORK) LOCK FLUSH 100 UNIT/ML IV SOLN
500.0000 [IU] | Freq: Once | INTRAVENOUS | Status: AC
  Administered 2019-05-19: 14:00:00 500 [IU] via INTRAVENOUS
  Filled 2019-05-19: qty 5

## 2019-05-19 MED ORDER — SODIUM CHLORIDE 0.9% FLUSH
10.0000 mL | INTRAVENOUS | Status: DC | PRN
  Administered 2019-05-19: 14:00:00 10 mL via INTRAVENOUS
  Filled 2019-05-19: qty 10

## 2019-07-14 ENCOUNTER — Other Ambulatory Visit: Payer: Self-pay

## 2019-07-14 ENCOUNTER — Inpatient Hospital Stay: Payer: Medicare Other | Attending: Hematology and Oncology

## 2019-07-14 VITALS — BP 129/81 | HR 87 | Temp 97.5°F | Resp 16

## 2019-07-14 DIAGNOSIS — Z452 Encounter for adjustment and management of vascular access device: Secondary | ICD-10-CM | POA: Diagnosis not present

## 2019-07-14 DIAGNOSIS — Z8572 Personal history of non-Hodgkin lymphomas: Secondary | ICD-10-CM | POA: Diagnosis present

## 2019-07-14 DIAGNOSIS — Z95828 Presence of other vascular implants and grafts: Secondary | ICD-10-CM

## 2019-07-14 MED ORDER — HEPARIN SOD (PORK) LOCK FLUSH 100 UNIT/ML IV SOLN
500.0000 [IU] | Freq: Once | INTRAVENOUS | Status: AC
  Administered 2019-07-14: 500 [IU] via INTRAVENOUS
  Filled 2019-07-14: qty 5

## 2019-07-14 MED ORDER — SODIUM CHLORIDE 0.9% FLUSH
10.0000 mL | INTRAVENOUS | Status: DC | PRN
  Administered 2019-07-14: 10 mL via INTRAVENOUS
  Filled 2019-07-14: qty 10

## 2019-08-03 ENCOUNTER — Ambulatory Visit (INDEPENDENT_AMBULATORY_CARE_PROVIDER_SITE_OTHER): Payer: Medicare Other | Admitting: Dermatology

## 2019-08-03 ENCOUNTER — Other Ambulatory Visit: Payer: Self-pay

## 2019-08-03 DIAGNOSIS — Z86018 Personal history of other benign neoplasm: Secondary | ICD-10-CM

## 2019-08-03 DIAGNOSIS — L820 Inflamed seborrheic keratosis: Secondary | ICD-10-CM

## 2019-08-03 DIAGNOSIS — D229 Melanocytic nevi, unspecified: Secondary | ICD-10-CM

## 2019-08-03 DIAGNOSIS — L821 Other seborrheic keratosis: Secondary | ICD-10-CM

## 2019-08-03 DIAGNOSIS — L578 Other skin changes due to chronic exposure to nonionizing radiation: Secondary | ICD-10-CM | POA: Diagnosis not present

## 2019-08-03 DIAGNOSIS — D1800 Hemangioma unspecified site: Secondary | ICD-10-CM

## 2019-08-03 DIAGNOSIS — Z1283 Encounter for screening for malignant neoplasm of skin: Secondary | ICD-10-CM | POA: Diagnosis not present

## 2019-08-03 DIAGNOSIS — L814 Other melanin hyperpigmentation: Secondary | ICD-10-CM

## 2019-08-03 NOTE — Progress Notes (Signed)
   Follow-Up Visit   Subjective  Sean Raymond is a 74 y.o. male who presents for the following: Annual Exam (patient has noticed irritated skin lesions on his face that he would like treated). The patient presents for total-body skin exam for skin cancer screening and mole check.  The following portions of the chart were reviewed this encounter and updated as appropriate:  Tobacco  Allergies  Meds  Problems  Med Hx  Surg Hx  Fam Hx     Review of Systems:  No other skin or systemic complaints except as noted in HPI or Assessment and Plan.  Objective  Well appearing patient in no apparent distress; mood and affect are within normal limits.  A full examination was performed including scalp, head, eyes, ears, nose, lips, neck, chest, axillae, abdomen, back, buttocks, bilateral upper extremities, bilateral lower extremities, hands, feet, fingers, toes, fingernails, and toenails. All findings within normal limits unless otherwise noted below.  Objective  Upper back: Scar with no evidence of recurrence.   Objective  Above the R brow x 1, R zygoma x 2, R temple within the hair x 1, L forearm x 1 (5): Erythematous keratotic or waxy stuck-on papule or plaque.    Assessment & Plan  History of dysplastic nevus Upper back Clear. Observe for recurrence. Call clinic for new or changing lesions.  Recommend regular skin exams, daily broad-spectrum spf 30+ sunscreen use, and photoprotection.    Well healed scar  Inflamed seborrheic keratosis (5) Above the R brow x 1, R zygoma x 2, R temple within the hair x 1, L forearm x 1  Destruction of lesion - Above the R brow x 1, R zygoma x 2, R temple within the hair x 1, L forearm x 1 Complexity: simple   Destruction method: cryotherapy   Informed consent: discussed and consent obtained   Timeout:  patient name, date of birth, surgical site, and procedure verified Lesion destroyed using liquid nitrogen: Yes   Region frozen until ice  ball extended beyond lesion: Yes   Outcome: patient tolerated procedure well with no complications   Post-procedure details: wound care instructions given    Skin cancer screening   Seborrheic Keratoses - Stuck-on, waxy, tan-brown papules and plaques  - Discussed benign etiology and prognosis. - Observe - Call for any changes  Actinic Damage - diffuse scaly erythematous macules with underlying dyspigmentation - Recommend daily broad spectrum sunscreen SPF 30+ to sun-exposed areas, reapply every 2 hours as needed.  - Call for new or changing lesions.  Lentigines - Scattered tan macules - Discussed due to sun exposure - Benign, observe - Call for any changes  Melanocytic Nevi - Tan-brown and/or pink-flesh-colored symmetric macules and papules - Benign appearing on exam today - Observation - Call clinic for new or changing moles - Recommend daily use of broad spectrum spf 30+ sunscreen to sun-exposed areas.   Hemangiomas - Red papules - Discussed benign nature - Observe - Call for any changes   Return in about 1 year (around 08/02/2020) for TBSE.  Luther Redo, CMA, am acting as scribe for Sarina Ser, MD .   Documentation: I have reviewed the above documentation for accuracy and completeness, and I agree with the above.  Sarina Ser, MD

## 2019-08-04 ENCOUNTER — Encounter: Payer: Self-pay | Admitting: Dermatology

## 2019-09-08 ENCOUNTER — Inpatient Hospital Stay: Payer: Medicare Other | Attending: Hematology and Oncology

## 2019-09-08 ENCOUNTER — Other Ambulatory Visit: Payer: Self-pay

## 2019-09-08 ENCOUNTER — Inpatient Hospital Stay: Payer: Medicare Other

## 2019-09-08 DIAGNOSIS — Z8572 Personal history of non-Hodgkin lymphomas: Secondary | ICD-10-CM | POA: Insufficient documentation

## 2019-09-08 DIAGNOSIS — Z452 Encounter for adjustment and management of vascular access device: Secondary | ICD-10-CM | POA: Diagnosis not present

## 2019-09-08 DIAGNOSIS — Z95828 Presence of other vascular implants and grafts: Secondary | ICD-10-CM

## 2019-09-08 MED ORDER — HEPARIN SOD (PORK) LOCK FLUSH 100 UNIT/ML IV SOLN
INTRAVENOUS | Status: AC
  Filled 2019-09-08: qty 5

## 2019-09-08 MED ORDER — SODIUM CHLORIDE 0.9% FLUSH
10.0000 mL | Freq: Once | INTRAVENOUS | Status: AC
  Administered 2019-09-08: 10 mL via INTRAVENOUS
  Filled 2019-09-08: qty 10

## 2019-09-08 MED ORDER — HEPARIN SOD (PORK) LOCK FLUSH 100 UNIT/ML IV SOLN
500.0000 [IU] | Freq: Once | INTRAVENOUS | Status: AC
  Administered 2019-09-08: 500 [IU] via INTRAVENOUS
  Filled 2019-09-08: qty 5

## 2019-11-02 ENCOUNTER — Other Ambulatory Visit: Payer: Self-pay

## 2019-11-02 ENCOUNTER — Inpatient Hospital Stay: Payer: Medicare Other | Attending: Hematology and Oncology

## 2019-11-02 DIAGNOSIS — Z452 Encounter for adjustment and management of vascular access device: Secondary | ICD-10-CM | POA: Insufficient documentation

## 2019-11-02 DIAGNOSIS — Z8572 Personal history of non-Hodgkin lymphomas: Secondary | ICD-10-CM | POA: Insufficient documentation

## 2019-11-02 DIAGNOSIS — Z95828 Presence of other vascular implants and grafts: Secondary | ICD-10-CM

## 2019-11-02 MED ORDER — SODIUM CHLORIDE 0.9% FLUSH
10.0000 mL | INTRAVENOUS | Status: DC | PRN
  Administered 2019-11-02: 10 mL via INTRAVENOUS
  Filled 2019-11-02: qty 10

## 2019-11-02 MED ORDER — HEPARIN SOD (PORK) LOCK FLUSH 100 UNIT/ML IV SOLN
500.0000 [IU] | Freq: Once | INTRAVENOUS | Status: AC
  Administered 2019-11-02: 500 [IU] via INTRAVENOUS
  Filled 2019-11-02: qty 5

## 2019-11-02 MED ORDER — HEPARIN SOD (PORK) LOCK FLUSH 100 UNIT/ML IV SOLN
INTRAVENOUS | Status: AC
  Filled 2019-11-02: qty 5

## 2019-11-03 ENCOUNTER — Inpatient Hospital Stay: Payer: Medicare Other

## 2019-12-19 ENCOUNTER — Other Ambulatory Visit: Payer: Medicare Other

## 2019-12-21 ENCOUNTER — Inpatient Hospital Stay: Payer: Medicare Other | Attending: Hematology and Oncology

## 2019-12-21 ENCOUNTER — Other Ambulatory Visit: Payer: Self-pay

## 2019-12-21 DIAGNOSIS — C8211 Follicular lymphoma grade II, lymph nodes of head, face, and neck: Secondary | ICD-10-CM

## 2019-12-21 DIAGNOSIS — H532 Diplopia: Secondary | ICD-10-CM | POA: Insufficient documentation

## 2019-12-21 DIAGNOSIS — Z8572 Personal history of non-Hodgkin lymphomas: Secondary | ICD-10-CM | POA: Insufficient documentation

## 2019-12-21 DIAGNOSIS — Z800 Family history of malignant neoplasm of digestive organs: Secondary | ICD-10-CM | POA: Diagnosis not present

## 2019-12-21 DIAGNOSIS — Z801 Family history of malignant neoplasm of trachea, bronchus and lung: Secondary | ICD-10-CM | POA: Diagnosis not present

## 2019-12-21 LAB — COMPREHENSIVE METABOLIC PANEL
ALT: 21 U/L (ref 0–44)
AST: 22 U/L (ref 15–41)
Albumin: 4.1 g/dL (ref 3.5–5.0)
Alkaline Phosphatase: 55 U/L (ref 38–126)
Anion gap: 7 (ref 5–15)
BUN: 13 mg/dL (ref 8–23)
CO2: 26 mmol/L (ref 22–32)
Calcium: 8.3 mg/dL — ABNORMAL LOW (ref 8.9–10.3)
Chloride: 106 mmol/L (ref 98–111)
Creatinine, Ser: 0.94 mg/dL (ref 0.61–1.24)
GFR calc Af Amer: >60 mL/min (ref >60)
GFR calc non Af Amer: >60 mL/min (ref >60)
Glucose, Bld: 89 mg/dL (ref 70–99)
Potassium: 3.8 mmol/L (ref 3.5–5.1)
Sodium: 139 mmol/L (ref 135–145)
Total Bilirubin: 1.4 mg/dL — ABNORMAL HIGH (ref 0.3–1.2)
Total Protein: 6.3 g/dL — ABNORMAL LOW (ref 6.5–8.1)

## 2019-12-21 LAB — CBC WITH DIFFERENTIAL/PLATELET
Abs Immature Granulocytes: 0.02 10*3/uL (ref 0.00–0.07)
Basophils Absolute: 0.1 10*3/uL (ref 0.0–0.1)
Basophils Relative: 1 %
Eosinophils Absolute: 0.1 10*3/uL (ref 0.0–0.5)
Eosinophils Relative: 1 %
HCT: 40.8 % (ref 39.0–52.0)
Hemoglobin: 13.8 g/dL (ref 13.0–17.0)
Immature Granulocytes: 0 %
Lymphocytes Relative: 22 %
Lymphs Abs: 1.4 10*3/uL (ref 0.7–4.0)
MCH: 30.1 pg (ref 26.0–34.0)
MCHC: 33.8 g/dL (ref 30.0–36.0)
MCV: 89.1 fL (ref 80.0–100.0)
Monocytes Absolute: 0.5 10*3/uL (ref 0.1–1.0)
Monocytes Relative: 9 %
Neutro Abs: 4.2 10*3/uL (ref 1.7–7.7)
Neutrophils Relative %: 67 %
Platelets: 159 10*3/uL (ref 150–400)
RBC: 4.58 MIL/uL (ref 4.22–5.81)
RDW: 13.1 % (ref 11.5–15.5)
WBC: 6.3 10*3/uL (ref 4.0–10.5)
nRBC: 0 % (ref 0.0–0.2)

## 2019-12-21 LAB — LACTATE DEHYDROGENASE: LDH: 145 U/L (ref 98–192)

## 2019-12-21 MED ORDER — HEPARIN SOD (PORK) LOCK FLUSH 100 UNIT/ML IV SOLN
500.0000 [IU] | Freq: Once | INTRAVENOUS | Status: AC
  Administered 2019-12-21: 500 [IU] via INTRAVENOUS
  Filled 2019-12-21: qty 5

## 2019-12-21 MED ORDER — SODIUM CHLORIDE 0.9% FLUSH
10.0000 mL | Freq: Once | INTRAVENOUS | Status: AC
  Administered 2019-12-21: 10 mL via INTRAVENOUS
  Filled 2019-12-21: qty 10

## 2019-12-22 ENCOUNTER — Other Ambulatory Visit: Payer: Medicare Other

## 2019-12-24 NOTE — Progress Notes (Signed)
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  He continues to feel well and is at his baseline.  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 He has no neurologic complaints.  He denies any fevers, weight loss, or night sweats.  He has noted no new lymphadenopathy.  He denies any chest pain, shortness of breath, cough, or hemoptysis.  He denies any nausea, vomiting, constipation, or diarrhea.  He has no urinary complaints.  Patient offers no further specific complaints today.  REVIEW OF SYSTEMS:   Review of Systems  Constitutional: Negative.  Negative for diaphoresis, fever, malaise/fatigue and weight loss.  Eyes: Positive for double vision. Negative for blurred vision and pain.  Respiratory: Negative.  Negative for cough and shortness of breath.   Cardiovascular: Negative.  Negative for chest pain and leg swelling.  Gastrointestinal: Negative.  Negative for abdominal pain.  Genitourinary: Negative.  Negative for dysuria.  Musculoskeletal: Negative.  Negative for neck pain.  Skin: Negative.  Negative for rash.  Neurological: Negative.  Negative for sensory change, focal weakness, weakness and headaches.  Psychiatric/Behavioral: Negative.  The patient is not nervous/anxious.     As per HPI. Otherwise, a complete review of systems is negative.  PAST MEDICAL HISTORY: Past Medical History:  Diagnosis Date  . Follicular lymphoma ([place]) 2009   Patient had chemo + rad tx's and zevaline shots.  Marland Kitchen GERD (gastroesophageal reflux disease)   . Hemorrhoids   . Sleep apnea     PAST  SURGICAL HISTORY: Hemorrhoidectomy.  FAMILY HISTORY: Colon cancer and lung cancer.     ADVANCED DIRECTIVES:    HEALTH MAINTENANCE: Social History   Tobacco Use  . Smoking status: Never Smoker  . Smokeless tobacco: Never Used  Substance Use Topics  . Alcohol use: Yes    Comment: occasional  . Drug use: No     Allergies  Allergen Reactions  . Tape Rash    Current Outpatient Medications  Medication Sig Dispense Refill  . amLODipine (NORVASC) 5 MG tablet Take 5 mg by mouth daily.     Marland Kitchen aspirin EC 81 MG tablet Take 81 mg by mouth daily.     Marland Kitchen atorvastatin (LIPITOR) 10 MG tablet Take 10 mg by mouth daily at 6 PM.     . Calcium Carbonate-Vitamin D 600-400 MG-UNIT per tablet Take 1 tablet by mouth 2 (two) times daily.     . cetirizine (ZYRTEC) 10 MG tablet Take 10 mg by mouth daily.    . Multiple Vitamin (MULTI-VITAMINS) TABS Take 1 tablet by mouth daily.     Marland Kitchen MYRBETRIQ 50 MG TB24 tablet Take 50 mg by mouth daily.    Marland Kitchen omeprazole (PRILOSEC) 40 MG capsule Take 40 mg by mouth daily.     . tadalafil (CIALIS) 20 MG tablet Take by mouth.    . tamsulosin (FLOMAX) 0.4 MG CAPS capsule Take 1 capsule by mouth daily.     No current facility-administered medications for this visit.    OBJECTIVE: Vitals:   12/29/19 1357  BP: 129/78  Pulse: 84  Resp: 20  Temp: 98.6 F (37 C)  SpO2: 96%  Body mass index is 30.77 kg/m.    ECOG FS:0 - Asymptomatic  General: Well-developed, well-nourished, no acute distress. Eyes: Pink conjunctiva, anicteric sclera. HEENT: Normocephalic, moist mucous membranes. Lungs: No audible wheezing or coughing. Heart: Regular rate and rhythm. Abdomen: Soft, nontender, no obvious distention. Musculoskeletal: No edema, cyanosis, or clubbing. Neuro: Alert, answering all questions appropriately. Cranial nerves grossly intact. Skin: No rashes or petechiae noted. Psych: Normal affect. Lymphatics: No lymphadenopathy noted.  LAB RESULTS:  Lab Results    Component Value Date   NA 139 12/21/2019   K 3.8 12/21/2019   CL 106 12/21/2019   CO2 26 12/21/2019   GLUCOSE 89 12/21/2019   BUN 13 12/21/2019   CREATININE 0.94 12/21/2019   CALCIUM 8.3 (L) 12/21/2019   PROT 6.3 (L) 12/21/2019   ALBUMIN 4.1 12/21/2019   AST 22 12/21/2019   ALT 21 12/21/2019   ALKPHOS 55 12/21/2019   BILITOT 1.4 (H) 12/21/2019   GFRNONAA >60 12/21/2019   GFRAA >60 12/21/2019    Lab Results  Component Value Date   WBC 6.3 12/21/2019   NEUTROABS 4.2 12/21/2019   HGB 13.8 12/21/2019   HCT 40.8 12/21/2019   MCV 89.1 12/21/2019   PLT 159 12/21/2019     STUDIES: CT SOFT TISSUE NECK W CONTRAST  Result Date: 12/26/2019 CLINICAL DATA:  74 year old male with lymphoma.  Restaging. EXAM: CT NECK WITH CONTRAST TECHNIQUE: Multidetector CT imaging of the neck was performed using the standard protocol following the bolus administration of intravenous contrast. CONTRAST:  149mL OMNIPAQUE IOHEXOL 300 MG/ML  SOLN COMPARISON:  Face CT, CT Chest, Abdomen, and Pelvis today reported separately. Neck CT 12/23/2018 and earlier. FINDINGS: Pharynx and larynx: The glottis is closed. Mild generalized pharyngeal mucosal space soft tissue thickening appears stable since 2019. No discrete or enhancing pharyngeal or laryngeal mass. Parapharyngeal and retropharyngeal spaces are within normal limits. Salivary glands: Negative sublingual space. Submandibular glands are within normal limits. Small nodular hyperintense foci in the bilateral parotid glands have not significantly changed since 2019. Mild superimposed left parotid sialolithiasis. Thyroid: Stable, negative. Lymph nodes: Mildly increased size and number of lymph nodes at the left level 5 station, 11-14 mm short axis now versus 10-11 mm last year. See series 3, images 62-68. Nearby left level IIIb lymph nodes also appear mildly increased. Contralateral right level 5 mildly irregular nodal tissue also appears increased (series 3, images 65  and 70). Chronic increased number of subcentimeter level 1 and level 2 nodes appear more stable. But of these the largest is a 12 mm short axis right level 2A node on series 3, image 48 which was 10-11 mm previously. Vascular: Major vascular structures in the neck and at the skull base appear to remain patent. Limited intracranial: Negative. Visualized orbits: Reported separately today. Mastoids and visualized paranasal sinuses: Visualized paranasal sinuses and mastoids are stable and well pneumatized. Skeleton: Chronic degenerative changes in the cervical spine. No acute or suspicious osseous lesion identified. Upper chest: Reported separately today. IMPRESSION: 1. Mild enlargement of chronic bilateral cervical lymph nodes when compared to 2019 and 2020, suspicious for Active Lymphoma. No brand new lymphadenopathy. The largest nodes today are 14 mm short axis, versus 10-12 mm previously. 2. CT Face, chest, Abdomen, and Pelvis today are reported separately. Electronically Signed   By: Genevie Ann M.D.   On: 12/26/2019 14:41   CT Chest W Contrast  Result Date: 12/26/2019 CLINICAL DATA:  Follicular lymphoma, follow-up evaluation. EXAM: CT CHEST, ABDOMEN, AND PELVIS WITH CONTRAST TECHNIQUE:  Multidetector CT imaging of the chest, abdomen and pelvis was performed following the standard protocol during bolus administration of intravenous contrast. CONTRAST:  142mL OMNIPAQUE IOHEXOL 300 MG/ML  SOLN COMPARISON:  12/23/2018 FINDINGS: CT CHEST FINDINGS Cardiovascular: Stable appearance of heart and great vessels. Scattered atheromatous plaque. Mediastinum/Nodes: Lymph nodes in the LEFT axilla are abnormal with respect to number and slightly enlarged but unchanged. (Image 18, series 3) 10 mm low LEFT axillary lymph node previously approximately 11 mm. (Image 12, series 3) 11 mm LEFT axillary lymph node is unchanged. (Image 9, series 3) stable appearance of LEFT axillary lymph node. There are subpectoral lymph nodes as well  which are stable. Smaller RIGHT axillary nodes also increased in number without change. Subcutaneous densities in the RIGHT and LEFT chest wall in the subcutaneous fat without change. No thoracic inlet or mediastinal adenopathy.  No hilar adenopathy. Lungs/Pleura: Azygos fissure as on the prior study, a normal variant anatomy. LEFT basilar scarring similar to prior study. Musculoskeletal: See below for full musculoskeletal detail. No acute bone process related to the bony thorax. CT ABDOMEN PELVIS FINDINGS Hepatobiliary: No focal, suspicious hepatic lesion. Stable low-density in the inferior RIGHT hepatic lobe likely small cyst. No pericholecystic stranding. Small gallstone in the dependent gallbladder. No biliary duct distension. Pancreas: Pancreas normal without ductal dilation or sign of inflammation. Spleen: Spleen with small low-density lesion, likely benign and unchanged from previous imaging. Adrenals/Urinary Tract: Smoothly marginated low-density adrenal lesions bilaterally without change likely benign adenomas. Stable low-density LEFT renal lesions, interpolar lesion with exophytic features measures 33 Hounsfield units but is unchanged at approximately 8 mm. Other areas of low attenuation in the bilateral kidneys are similarly stable. No hydronephrosis. Symmetric renal enhancement. Stomach/Bowel: No acute gastrointestinal process. Small bowel is normal caliber. Colonic diverticulosis. Normal appendix. Vascular/Lymphatic: Calcified atheromatous plaque of the abdominal aorta both noncalcified and calcified plaque are present. No aneurysmal dilation. Slight increase in size of mesenteric lymph node on image 78 of series 3 approximately 14 short axis within the small bowel mesentery in the LEFT abdomen. In 2019 this measured approximately 9 mm. In September of 2018 this measured approximately 12-13 mm. Central abdominal mesentery with hazy characteristics. The no adenopathy elsewhere in the abdominal small  bowel mesentery or in the retroperitoneum with scattered small nodes that show no change. No pelvic sidewall lymphadenopathy. Reproductive: Stable appearance of partially calcified and heterogeneous prostate gland, nonspecific finding on CT. Other: No ascites. Stable umbilical hernia with a small amount of fluid density and without significant surrounding stranding or bowel involvement is unchanged. Musculoskeletal: No acute musculoskeletal process. No destructive bone finding. IMPRESSION: 1. Stable bilateral axillary lymph nodes. 2. Stable central mesenteric stranding. 3. Very slow increase in size of a central mesenteric lymph node as discussed. 4. Stable low-density lesions in the bilateral kidneys, interpolar lesion with exophytic features measures 33 Hounsfield units but is unchanged at approximately 8 mm. Further evaluation with renal ultrasound is suggested. This is likely hemorrhagic cyst but remains incompletely characterized, attention on follow-up. Finding without change since 2019. 5. Cholelithiasis without evidence of cholecystitis. 6. Stable umbilical hernia with a small amount of fluid density and without significant surrounding stranding or bowel involvement. 7. Aortic atherosclerosis. Aortic Atherosclerosis (ICD10-I70.0). Electronically Signed   By: Zetta Bills M.D.   On: 12/26/2019 13:01   CT ABDOMEN PELVIS W CONTRAST  Result Date: 12/26/2019 CLINICAL DATA:  Follicular lymphoma, follow-up evaluation. EXAM: CT CHEST, ABDOMEN, AND PELVIS WITH CONTRAST TECHNIQUE: Multidetector CT imaging of the chest, abdomen  and pelvis was performed following the standard protocol during bolus administration of intravenous contrast. CONTRAST:  167mL OMNIPAQUE IOHEXOL 300 MG/ML  SOLN COMPARISON:  12/23/2018 FINDINGS: CT CHEST FINDINGS Cardiovascular: Stable appearance of heart and great vessels. Scattered atheromatous plaque. Mediastinum/Nodes: Lymph nodes in the LEFT axilla are abnormal with respect to number  and slightly enlarged but unchanged. (Image 18, series 3) 10 mm low LEFT axillary lymph node previously approximately 11 mm. (Image 12, series 3) 11 mm LEFT axillary lymph node is unchanged. (Image 9, series 3) stable appearance of LEFT axillary lymph node. There are subpectoral lymph nodes as well which are stable. Smaller RIGHT axillary nodes also increased in number without change. Subcutaneous densities in the RIGHT and LEFT chest wall in the subcutaneous fat without change. No thoracic inlet or mediastinal adenopathy.  No hilar adenopathy. Lungs/Pleura: Azygos fissure as on the prior study, a normal variant anatomy. LEFT basilar scarring similar to prior study. Musculoskeletal: See below for full musculoskeletal detail. No acute bone process related to the bony thorax. CT ABDOMEN PELVIS FINDINGS Hepatobiliary: No focal, suspicious hepatic lesion. Stable low-density in the inferior RIGHT hepatic lobe likely small cyst. No pericholecystic stranding. Small gallstone in the dependent gallbladder. No biliary duct distension. Pancreas: Pancreas normal without ductal dilation or sign of inflammation. Spleen: Spleen with small low-density lesion, likely benign and unchanged from previous imaging. Adrenals/Urinary Tract: Smoothly marginated low-density adrenal lesions bilaterally without change likely benign adenomas. Stable low-density LEFT renal lesions, interpolar lesion with exophytic features measures 33 Hounsfield units but is unchanged at approximately 8 mm. Other areas of low attenuation in the bilateral kidneys are similarly stable. No hydronephrosis. Symmetric renal enhancement. Stomach/Bowel: No acute gastrointestinal process. Small bowel is normal caliber. Colonic diverticulosis. Normal appendix. Vascular/Lymphatic: Calcified atheromatous plaque of the abdominal aorta both noncalcified and calcified plaque are present. No aneurysmal dilation. Slight increase in size of mesenteric lymph node on image 78 of  series 3 approximately 14 short axis within the small bowel mesentery in the LEFT abdomen. In 2019 this measured approximately 9 mm. In September of 2018 this measured approximately 12-13 mm. Central abdominal mesentery with hazy characteristics. The no adenopathy elsewhere in the abdominal small bowel mesentery or in the retroperitoneum with scattered small nodes that show no change. No pelvic sidewall lymphadenopathy. Reproductive: Stable appearance of partially calcified and heterogeneous prostate gland, nonspecific finding on CT. Other: No ascites. Stable umbilical hernia with a small amount of fluid density and without significant surrounding stranding or bowel involvement is unchanged. Musculoskeletal: No acute musculoskeletal process. No destructive bone finding. IMPRESSION: 1. Stable bilateral axillary lymph nodes. 2. Stable central mesenteric stranding. 3. Very slow increase in size of a central mesenteric lymph node as discussed. 4. Stable low-density lesions in the bilateral kidneys, interpolar lesion with exophytic features measures 33 Hounsfield units but is unchanged at approximately 8 mm. Further evaluation with renal ultrasound is suggested. This is likely hemorrhagic cyst but remains incompletely characterized, attention on follow-up. Finding without change since 2019. 5. Cholelithiasis without evidence of cholecystitis. 6. Stable umbilical hernia with a small amount of fluid density and without significant surrounding stranding or bowel involvement. 7. Aortic atherosclerosis. Aortic Atherosclerosis (ICD10-I70.0). Electronically Signed   By: Zetta Bills M.D.   On: 12/26/2019 13:01   CT MAXILLOFACIAL W CONTRAST  Result Date: 12/26/2019 CLINICAL DATA:  Follicular lymphoma grade 2 of lymph nodes of head, face and neck. EXAM: CT MAXILLOFACIAL WITH CONTRAST TECHNIQUE: Multidetector CT imaging of the maxillofacial structures was performed with  intravenous contrast. Multiplanar CT image  reconstructions were also generated. CONTRAST:  157mL OMNIPAQUE IOHEXOL 300 MG/ML  SOLN COMPARISON:  Maxillofacial CT 12/23/2018. FINDINGS: The examination is mildly motion degraded. Osseous: No acute bony abnormality or aggressive osseous lesion. Orbits: No orbital mass. Sinuses: Mild ethmoid sinus mucosal thickening. Trace mucosal thickening within the maxillary sinuses. Soft tissues: No new soft tissue mass. A lymph node within the inferior aspect of the left parotid gland has minimally increased in size, now measuring 4 mm in short axis (previously 3 mm). Small intraparotid lymph nodes are otherwise unchanged. Submandibular lymph nodes have not significantly changed. As before, the single largest submandibular node on the left measures up to 8 mm in short axis. Limited intracranial: No acute intracranial abnormality identified. IMPRESSION: Mildly motion degraded exam. There has been a subtle nonspecific interval increase in size of a lymph node within the inferior aspect of the left parotid gland, now measuring 4 mm in short axis (previously 3 mm). Subcentimeter lymph nodes within the bilateral parotid glands have otherwise not significantly changed. Unchanged mild nodal prominence within the submandibular region. As before, the largest submandibular node on the left measures 8 mm in short axis. 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  CT scan results from February 25, 2020 reviewed independently and report as above with mild increase in cervical lymphadenopathy from 11 mm up to 14 mm.  He has no other evidence of disease.  It is unclear if this is progression of disease or benign inflammation.  Continue to monitor.  No intervention is needed at this time.  Continue  routine yearly evaluation with imaging until 2024 when patient is 10 years removed from his treatments.  Return to clinic in 1 year for further evaluation and discussion of his imaging results. 2.  Port: Patient continues to maintain his port and does not wish to take it out at this time.  Continue port flushes every 6 weeks.  I spent a total of 20 minutes reviewing chart data, face-to-face evaluation with the patient, counseling and coordination of care as detailed above.    Patient expressed understanding and was in agreement with this plan. He also understands that He can call clinic at any time with any questions, concerns, or complaints.    Lloyd Huger, MD   12/29/2019 4:23 PM

## 2019-12-26 ENCOUNTER — Other Ambulatory Visit: Payer: Self-pay

## 2019-12-26 ENCOUNTER — Ambulatory Visit
Admission: RE | Admit: 2019-12-26 | Discharge: 2019-12-26 | Disposition: A | Discharge status: Home / Self Care | Payer: Medicare Other | Source: Ambulatory Visit | Attending: Oncology | Admitting: Oncology

## 2019-12-26 DIAGNOSIS — C8211 Follicular lymphoma grade II, lymph nodes of head, face, and neck: Secondary | ICD-10-CM | POA: Diagnosis not present

## 2019-12-26 MED ORDER — IOHEXOL 300 MG/ML  SOLN
100.0000 mL | Freq: Once | INTRAMUSCULAR | Status: AC | PRN
  Administered 2019-12-26: 100 mL via INTRAVENOUS

## 2019-12-28 ENCOUNTER — Encounter: Payer: Self-pay | Admitting: Oncology

## 2019-12-28 NOTE — Progress Notes (Signed)
Patient called for pre assessment. Denies any pain or concerns at this time.  °

## 2019-12-29 ENCOUNTER — Encounter: Payer: Self-pay | Admitting: Oncology

## 2019-12-29 ENCOUNTER — Inpatient Hospital Stay (HOSPITAL_BASED_OUTPATIENT_CLINIC_OR_DEPARTMENT_OTHER): Payer: Medicare Other | Admitting: Oncology

## 2019-12-29 ENCOUNTER — Other Ambulatory Visit: Payer: Self-pay

## 2019-12-29 VITALS — BP 129/78 | HR 84 | Temp 98.6°F | Resp 20 | Ht 70.5 in | Wt 217.5 lb

## 2019-12-29 DIAGNOSIS — C8211 Follicular lymphoma grade II, lymph nodes of head, face, and neck: Secondary | ICD-10-CM | POA: Diagnosis not present

## 2019-12-29 DIAGNOSIS — Z8572 Personal history of non-Hodgkin lymphomas: Secondary | ICD-10-CM | POA: Diagnosis not present

## 2020-02-08 ENCOUNTER — Encounter: Payer: Self-pay | Admitting: Ophthalmology

## 2020-02-08 ENCOUNTER — Inpatient Hospital Stay: Payer: Medicare Other | Attending: Hematology and Oncology

## 2020-02-08 ENCOUNTER — Other Ambulatory Visit: Payer: Self-pay

## 2020-02-08 DIAGNOSIS — Z452 Encounter for adjustment and management of vascular access device: Secondary | ICD-10-CM | POA: Insufficient documentation

## 2020-02-08 DIAGNOSIS — C8211 Follicular lymphoma grade II, lymph nodes of head, face, and neck: Secondary | ICD-10-CM | POA: Diagnosis present

## 2020-02-08 DIAGNOSIS — Z95828 Presence of other vascular implants and grafts: Secondary | ICD-10-CM

## 2020-02-08 MED ORDER — HEPARIN SOD (PORK) LOCK FLUSH 100 UNIT/ML IV SOLN
500.0000 [IU] | Freq: Once | INTRAVENOUS | Status: AC
  Administered 2020-02-08: 500 [IU] via INTRAVENOUS
  Filled 2020-02-08: qty 5

## 2020-02-08 MED ORDER — SODIUM CHLORIDE 0.9% FLUSH
10.0000 mL | INTRAVENOUS | Status: DC | PRN
  Administered 2020-02-08: 10 mL via INTRAVENOUS
  Filled 2020-02-08: qty 10

## 2020-02-12 ENCOUNTER — Other Ambulatory Visit: Payer: Self-pay

## 2020-02-12 ENCOUNTER — Other Ambulatory Visit
Admission: RE | Admit: 2020-02-12 | Discharge: 2020-02-12 | Disposition: A | Discharge status: Home / Self Care | Payer: Medicare Other | Source: Ambulatory Visit | Attending: Ophthalmology | Admitting: Ophthalmology

## 2020-02-12 DIAGNOSIS — Z01812 Encounter for preprocedural laboratory examination: Secondary | ICD-10-CM | POA: Insufficient documentation

## 2020-02-12 DIAGNOSIS — Z20822 Contact with and (suspected) exposure to covid-19: Secondary | ICD-10-CM | POA: Insufficient documentation

## 2020-02-12 LAB — SARS CORONAVIRUS 2 (TAT 6-24 HRS): SARS Coronavirus 2: NEGATIVE

## 2020-02-12 NOTE — Discharge Instructions (Signed)

## 2020-02-14 ENCOUNTER — Encounter
Admission: RE | Disposition: A | Discharge status: Home / Self Care | Payer: Self-pay | Source: Home / Self Care | Attending: Ophthalmology

## 2020-02-14 ENCOUNTER — Ambulatory Visit: Payer: Medicare Other | Admitting: Anesthesiology

## 2020-02-14 ENCOUNTER — Encounter: Payer: Self-pay | Admitting: Ophthalmology

## 2020-02-14 ENCOUNTER — Other Ambulatory Visit: Payer: Self-pay

## 2020-02-14 ENCOUNTER — Ambulatory Visit
Admission: RE | Admit: 2020-02-14 | Discharge: 2020-02-14 | Disposition: A | Discharge status: Home / Self Care | Payer: Medicare Other | Attending: Ophthalmology | Admitting: Ophthalmology

## 2020-02-14 DIAGNOSIS — Z7982 Long term (current) use of aspirin: Secondary | ICD-10-CM | POA: Diagnosis not present

## 2020-02-14 DIAGNOSIS — Z79899 Other long term (current) drug therapy: Secondary | ICD-10-CM | POA: Diagnosis not present

## 2020-02-14 DIAGNOSIS — H2512 Age-related nuclear cataract, left eye: Secondary | ICD-10-CM | POA: Insufficient documentation

## 2020-02-14 HISTORY — DX: Dizziness and giddiness: R42

## 2020-02-14 HISTORY — DX: Essential (primary) hypertension: I10

## 2020-02-14 HISTORY — DX: Pure hypercholesterolemia, unspecified: E78.00

## 2020-02-14 HISTORY — PX: CATARACT EXTRACTION W/PHACO: SHX586

## 2020-02-14 HISTORY — DX: Cardiac murmur, unspecified: R01.1

## 2020-02-14 HISTORY — DX: Personal history of other medical treatment: Z92.89

## 2020-02-14 HISTORY — DX: Other seasonal allergic rhinitis: J30.2

## 2020-02-14 SURGERY — PHACOEMULSIFICATION, CATARACT, WITH IOL INSERTION
Anesthesia: Monitor Anesthesia Care | Site: Eye | Laterality: Left

## 2020-02-14 MED ORDER — ARMC OPHTHALMIC DILATING DROPS
1.0000 "application " | OPHTHALMIC | Status: DC | PRN
  Administered 2020-02-14 (×3): 1 via OPHTHALMIC

## 2020-02-14 MED ORDER — LIDOCAINE HCL (PF) 2 % IJ SOLN
INTRAOCULAR | Status: DC | PRN
  Administered 2020-02-14: 2 mL

## 2020-02-14 MED ORDER — OXYCODONE HCL 5 MG PO TABS: 5.0000 mg | ORAL_TABLET | Freq: Once | ORAL | Status: DC | PRN

## 2020-02-14 MED ORDER — OXYCODONE HCL 5 MG/5ML PO SOLN: 5.0000 mg | Freq: Once | ORAL | Status: DC | PRN

## 2020-02-14 MED ORDER — NA HYALUR & NA CHOND-NA HYALUR 0.4-0.35 ML IO KIT
PACK | INTRAOCULAR | Status: DC | PRN
  Administered 2020-02-14: 1 mL via INTRAOCULAR

## 2020-02-14 MED ORDER — FENTANYL CITRATE (PF) 100 MCG/2ML IJ SOLN
INTRAMUSCULAR | Status: DC | PRN
  Administered 2020-02-14: 50 ug via INTRAVENOUS

## 2020-02-14 MED ORDER — EPINEPHRINE PF 1 MG/ML IJ SOLN
INTRAOCULAR | Status: DC | PRN
  Administered 2020-02-14: 77 mL via OPHTHALMIC

## 2020-02-14 MED ORDER — LACTATED RINGERS IV SOLN: INTRAVENOUS | Status: DC

## 2020-02-14 MED ORDER — MIDAZOLAM HCL 2 MG/2ML IJ SOLN
INTRAMUSCULAR | Status: DC | PRN
  Administered 2020-02-14: 1 mg via INTRAVENOUS

## 2020-02-14 MED ORDER — TETRACAINE HCL 0.5 % OP SOLN
1.0000 [drp] | OPHTHALMIC | Status: DC | PRN
  Administered 2020-02-14 (×3): 1 [drp] via OPHTHALMIC

## 2020-02-14 MED ORDER — BRIMONIDINE TARTRATE-TIMOLOL 0.2-0.5 % OP SOLN
OPHTHALMIC | Status: DC | PRN
  Administered 2020-02-14: 1 [drp] via OPHTHALMIC

## 2020-02-14 MED ORDER — MOXIFLOXACIN HCL 0.5 % OP SOLN
1.0000 [drp] | OPHTHALMIC | Status: DC | PRN
  Administered 2020-02-14 (×3): 1 [drp] via OPHTHALMIC

## 2020-02-14 MED ORDER — CEFUROXIME OPHTHALMIC INJECTION 1 MG/0.1 ML
INJECTION | OPHTHALMIC | Status: DC | PRN
  Administered 2020-02-14: 0.1 mL via INTRACAMERAL

## 2020-02-14 SURGICAL SUPPLY — 23 items
CANNULA ANT/CHMB 27G (MISCELLANEOUS) ×1 IMPLANT
CANNULA ANT/CHMB 27GA (MISCELLANEOUS) ×2 IMPLANT
GLOVE SURG LX 7.5 STRW (GLOVE) ×1
GLOVE SURG LX STRL 7.5 STRW (GLOVE) ×1 IMPLANT
GLOVE SURG TRIUMPH 8.0 PF LTX (GLOVE) ×2 IMPLANT
GOWN STRL REUS W/ TWL LRG LVL3 (GOWN DISPOSABLE) ×2 IMPLANT
GOWN STRL REUS W/TWL LRG LVL3 (GOWN DISPOSABLE) ×4
LENS IOL DIOP 17.5 (Intraocular Lens) ×2 IMPLANT
LENS IOL TECNIS MONO 17.5 (Intraocular Lens) IMPLANT
MARKER SKIN DUAL TIP RULER LAB (MISCELLANEOUS) ×2 IMPLANT
NDL CAPSULORHEX 25GA (NEEDLE) ×1 IMPLANT
NDL FILTER BLUNT 18X1 1/2 (NEEDLE) ×2 IMPLANT
NEEDLE CAPSULORHEX 25GA (NEEDLE) ×2 IMPLANT
NEEDLE FILTER BLUNT 18X 1/2SAF (NEEDLE) ×2
NEEDLE FILTER BLUNT 18X1 1/2 (NEEDLE) ×2 IMPLANT
PACK CATARACT BRASINGTON (MISCELLANEOUS) ×2 IMPLANT
PACK EYE AFTER SURG (MISCELLANEOUS) ×2 IMPLANT
PACK OPTHALMIC (MISCELLANEOUS) ×2 IMPLANT
SOLUTION OPHTHALMIC SALT (MISCELLANEOUS) ×2 IMPLANT
SYR 3ML LL SCALE MARK (SYRINGE) ×4 IMPLANT
SYR TB 1ML LUER SLIP (SYRINGE) ×2 IMPLANT
WATER STERILE IRR 250ML POUR (IV SOLUTION) ×2 IMPLANT
WIPE NON LINTING 3.25X3.25 (MISCELLANEOUS) ×2 IMPLANT

## 2020-02-14 NOTE — Transfer of Care (Signed)
Immediate Anesthesia Transfer of Care Note  Patient: Sean Raymond  Procedure(s) Performed: CATARACT EXTRACTION PHACO AND INTRAOCULAR LENS PLACEMENT (IOC) LEFT 4.74 01:03.1 7.5% (Left Eye)  Patient Location: PACU  Anesthesia Type: MAC  Level of Consciousness: awake, alert  and patient cooperative  Airway and Oxygen Therapy: Patient Spontanous Breathing and Patient connected to supplemental oxygen  Post-op Assessment: Post-op Vital signs reviewed, Patient's Cardiovascular Status Stable, Respiratory Function Stable, Patent Airway and No signs of Nausea or vomiting  Post-op Vital Signs: Reviewed and stable  Complications: No complications documented.

## 2020-02-14 NOTE — Op Note (Signed)
OPERATIVE NOTE  Makoa Satz [id number] 02/14/2020   PREOPERATIVE DIAGNOSIS:  Nuclear sclerotic cataract left eye. H25.12   POSTOPERATIVE DIAGNOSIS:    Nuclear sclerotic cataract left eye.     PROCEDURE:  Phacoemusification with posterior chamber intraocular lens placement of the left eye  Ultrasound time: Procedure(s): CATARACT EXTRACTION PHACO AND INTRAOCULAR LENS PLACEMENT (IOC) LEFT 4.74 01:03.1 7.5% (Left)  LENS:   Implant Name Type Inv. Item Serial No. Manufacturer Lot No. LRB No. Used Action  LENS IOL DIOP 17.5 - [id number] Intraocular Lens LENS IOL DIOP 17.5 [id number]   Left 1 Implanted      SURGEON:  Wyonia Hough, MD   ANESTHESIA:  Topical with tetracaine drops and 2% Xylocaine jelly, augmented with 1% preservative-free intracameral lidocaine.    COMPLICATIONS:  None.   DESCRIPTION OF PROCEDURE:  The patient was identified in the holding room and transported to the operating room and placed in the supine position under the operating microscope.  The left eye was identified as the operative eye and it was prepped and draped in the usual sterile ophthalmic fashion.   A 1 millimeter clear-corneal paracentesis was made at the 1:30 position.  0.5 ml of preservative-free 1% lidocaine was injected into the anterior chamber.  The anterior chamber was filled with Viscoat viscoelastic.  A 2.4 millimeter keratome was used to make a near-clear corneal incision at the 10:30 position.  .  A curvilinear capsulorrhexis was made with a cystotome and capsulorrhexis forceps.  Balanced salt solution was used to hydrodissect and hydrodelineate the nucleus.   Phacoemulsification was then used in stop and chop fashion to remove the lens nucleus and epinucleus.  The remaining cortex was then removed using the irrigation and aspiration handpiece. Provisc was then placed into the capsular bag to distend it for lens placement.  A lens was then injected into the capsular bag.   The remaining viscoelastic was aspirated.   Wounds were hydrated with balanced salt solution.  The anterior chamber was inflated to a physiologic pressure with balanced salt solution.  No wound leaks were noted. Cefuroxime 0.1 ml of a 10mg /ml solution was injected into the anterior chamber for a dose of 1 mg of intracameral antibiotic at the completion of the case.   Timolol and Brimonidine drops were applied to the eye.  The patient was taken to the recovery room in stable condition without complications of anesthesia or surgery.  Vallie Fayette 02/14/2020, 9:40 AM

## 2020-02-14 NOTE — Anesthesia Postprocedure Evaluation (Signed)
Anesthesia Post Note  Patient: Equan Cogbill  Procedure(s) Performed: CATARACT EXTRACTION PHACO AND INTRAOCULAR LENS PLACEMENT (IOC) LEFT 4.74 01:03.1 7.5% (Left Eye)     Patient location during evaluation: PACU Anesthesia Type: MAC Level of consciousness: awake and alert Pain management: pain level controlled Vital Signs Assessment: post-procedure vital signs reviewed and stable Respiratory status: spontaneous breathing, nonlabored ventilation, respiratory function stable and patient connected to nasal cannula oxygen Cardiovascular status: stable and blood pressure returned to baseline Postop Assessment: no apparent nausea or vomiting Anesthetic complications: no   No complications documented.  Fidel Levy

## 2020-02-14 NOTE — H&P (Signed)

## 2020-02-14 NOTE — Anesthesia Preprocedure Evaluation (Signed)
Anesthesia Evaluation  Patient identified by MRN, date of birth, ID band Patient awake    Reviewed: NPO status   History of Anesthesia Complications Negative for: history of anesthetic complications  Airway Mallampati: II  TM Distance: >3 FB Neck ROM: full    Dental no notable dental hx.    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   pcp : 8/21: sparks;   Reproductive/Obstetrics                             Anesthesia Physical Anesthesia Plan  ASA: II  Anesthesia Plan: MAC   Post-op Pain Management:    Induction:   PONV Risk Score and Plan: 1 and TIVA  Airway Management Planned:   Additional Equipment:   Intra-op Plan:   Post-operative Plan:   Informed Consent: I have reviewed the patients History and Physical, chart, labs and discussed the procedure including the risks, benefits and alternatives for the proposed anesthesia with the patient or authorized representative who has indicated his/her understanding and acceptance.       Plan Discussed with: CRNA  Anesthesia Plan Comments:         Anesthesia Quick Evaluation

## 2020-02-14 NOTE — Anesthesia Procedure Notes (Signed)
Date/Time: 02/14/2020 9:17 AM Performed by: Dionne Bucy, CRNA Oxygen Delivery Method: Nasal cannula

## 2020-02-15 ENCOUNTER — Encounter: Payer: Self-pay | Admitting: Ophthalmology

## 2020-02-19 ENCOUNTER — Inpatient Hospital Stay: Payer: Medicare Other

## 2020-04-03 ENCOUNTER — Other Ambulatory Visit: Payer: Self-pay

## 2020-04-03 ENCOUNTER — Inpatient Hospital Stay: Payer: Medicare Other | Attending: Hematology and Oncology

## 2020-04-03 DIAGNOSIS — C8211 Follicular lymphoma grade II, lymph nodes of head, face, and neck: Secondary | ICD-10-CM | POA: Diagnosis present

## 2020-04-03 DIAGNOSIS — Z452 Encounter for adjustment and management of vascular access device: Secondary | ICD-10-CM | POA: Insufficient documentation

## 2020-04-03 DIAGNOSIS — Z95828 Presence of other vascular implants and grafts: Secondary | ICD-10-CM

## 2020-04-03 MED ORDER — SODIUM CHLORIDE 0.9% FLUSH
10.0000 mL | Freq: Once | INTRAVENOUS | Status: AC
  Administered 2020-04-03: 10 mL via INTRAVENOUS
  Filled 2020-04-03: qty 10

## 2020-04-03 MED ORDER — HEPARIN SOD (PORK) LOCK FLUSH 100 UNIT/ML IV SOLN
INTRAVENOUS | Status: AC
  Filled 2020-04-03: qty 5

## 2020-04-03 MED ORDER — HEPARIN SOD (PORK) LOCK FLUSH 100 UNIT/ML IV SOLN
500.0000 [IU] | Freq: Once | INTRAVENOUS | Status: AC
  Administered 2020-04-03: 500 [IU] via INTRAVENOUS
  Filled 2020-04-03: qty 5

## 2020-04-03 NOTE — Progress Notes (Signed)
Pt received port flush in clinic today. Port with patent blood return.

## 2020-04-04 ENCOUNTER — Inpatient Hospital Stay: Payer: Medicare Other

## 2020-04-08 ENCOUNTER — Inpatient Hospital Stay: Payer: Medicare Other

## 2020-05-27 ENCOUNTER — Inpatient Hospital Stay: Payer: Medicare Other

## 2020-05-30 ENCOUNTER — Other Ambulatory Visit: Payer: Self-pay

## 2020-05-30 ENCOUNTER — Inpatient Hospital Stay: Payer: Medicare Other | Attending: Hematology and Oncology

## 2020-05-30 DIAGNOSIS — Z95828 Presence of other vascular implants and grafts: Secondary | ICD-10-CM

## 2020-05-30 DIAGNOSIS — C8211 Follicular lymphoma grade II, lymph nodes of head, face, and neck: Secondary | ICD-10-CM | POA: Insufficient documentation

## 2020-05-30 DIAGNOSIS — Z452 Encounter for adjustment and management of vascular access device: Secondary | ICD-10-CM | POA: Diagnosis not present

## 2020-05-30 MED ORDER — SODIUM CHLORIDE 0.9% FLUSH
10.0000 mL | Freq: Once | INTRAVENOUS | Status: AC
  Administered 2020-05-30: 10 mL via INTRAVENOUS
  Filled 2020-05-30: qty 10

## 2020-05-30 MED ORDER — HEPARIN SOD (PORK) LOCK FLUSH 100 UNIT/ML IV SOLN
500.0000 [IU] | Freq: Once | INTRAVENOUS | Status: AC
  Administered 2020-05-30: 500 [IU] via INTRAVENOUS
  Filled 2020-05-30: qty 5

## 2020-07-15 ENCOUNTER — Inpatient Hospital Stay: Payer: Medicare Other

## 2020-07-25 ENCOUNTER — Inpatient Hospital Stay: Payer: Medicare Other | Attending: Hematology and Oncology

## 2020-07-25 ENCOUNTER — Other Ambulatory Visit: Payer: Self-pay

## 2020-07-25 DIAGNOSIS — Z452 Encounter for adjustment and management of vascular access device: Secondary | ICD-10-CM | POA: Diagnosis not present

## 2020-07-25 DIAGNOSIS — C8211 Follicular lymphoma grade II, lymph nodes of head, face, and neck: Secondary | ICD-10-CM | POA: Diagnosis present

## 2020-07-25 DIAGNOSIS — Z95828 Presence of other vascular implants and grafts: Secondary | ICD-10-CM

## 2020-07-25 MED ORDER — HEPARIN SOD (PORK) LOCK FLUSH 100 UNIT/ML IV SOLN
500.0000 [IU] | Freq: Once | INTRAVENOUS | Status: AC
  Administered 2020-07-25: 500 [IU] via INTRAVENOUS
  Filled 2020-07-25: qty 5

## 2020-07-25 MED ORDER — SODIUM CHLORIDE 0.9% FLUSH
10.0000 mL | Freq: Once | INTRAVENOUS | Status: AC
Start: 2020-07-25 — End: 2020-07-25
  Administered 2020-07-25: 10 mL via INTRAVENOUS
  Filled 2020-07-25: qty 10

## 2020-08-08 ENCOUNTER — Other Ambulatory Visit: Payer: Self-pay

## 2020-08-08 ENCOUNTER — Ambulatory Visit (INDEPENDENT_AMBULATORY_CARE_PROVIDER_SITE_OTHER): Payer: Medicare Other | Admitting: Dermatology

## 2020-08-08 DIAGNOSIS — Z1283 Encounter for screening for malignant neoplasm of skin: Secondary | ICD-10-CM

## 2020-08-08 DIAGNOSIS — L814 Other melanin hyperpigmentation: Secondary | ICD-10-CM | POA: Diagnosis not present

## 2020-08-08 DIAGNOSIS — L570 Actinic keratosis: Secondary | ICD-10-CM | POA: Diagnosis not present

## 2020-08-08 DIAGNOSIS — L821 Other seborrheic keratosis: Secondary | ICD-10-CM

## 2020-08-08 DIAGNOSIS — L578 Other skin changes due to chronic exposure to nonionizing radiation: Secondary | ICD-10-CM

## 2020-08-08 DIAGNOSIS — L820 Inflamed seborrheic keratosis: Secondary | ICD-10-CM | POA: Diagnosis not present

## 2020-08-08 DIAGNOSIS — D229 Melanocytic nevi, unspecified: Secondary | ICD-10-CM

## 2020-08-08 DIAGNOSIS — D1800 Hemangioma unspecified site: Secondary | ICD-10-CM

## 2020-08-08 NOTE — Patient Instructions (Signed)

## 2020-08-08 NOTE — Progress Notes (Deleted)
Follow-Up Visit   Subjective  Fredrik Mogel is a 75 y.o. male who presents for the following: Annual Exam (Possible hx of skin CA on the back that was removed in [facility] many years ago).  The following portions of the chart were reviewed this encounter and updated as appropriate:      Review of Systems:  No other skin or systemic complaints except as noted in HPI or Assessment and Plan.  Objective  Well appearing patient in no apparent distress; mood and affect are within normal limits.  A full examination was performed including scalp, head, eyes, ears, nose, lips, neck, chest, axillae, abdomen, back, buttocks, bilateral upper extremities, bilateral lower extremities, hands, feet, fingers, toes, fingernails, and toenails. All findings within normal limits unless otherwise noted below.  Objective  Face x: Erythematous thin papules/macules with gritty scale.   Objective  R mastoid x 1: Erythematous keratotic or waxy stuck-on papule or plaque.   Assessment & Plan  AK (actinic keratosis) Face x  Destruction of lesion - Face x Complexity: simple   Destruction method: cryotherapy   Informed consent: discussed and consent obtained   Timeout:  patient name, date of birth, surgical site, and procedure verified Lesion destroyed using liquid nitrogen: Yes   Region frozen until ice ball extended beyond lesion: Yes   Outcome: patient tolerated procedure well with no complications   Post-procedure details: wound care instructions given    Inflamed seborrheic keratosis R mastoid x 1  Destruction of lesion - R mastoid x 1 Complexity: simple   Destruction method: cryotherapy   Informed consent: discussed and consent obtained   Timeout:  patient name, date of birth, surgical site, and procedure verified Lesion destroyed using liquid nitrogen: Yes   Region frozen until ice ball extended beyond lesion: Yes   Outcome: patient tolerated procedure well with no complications    Post-procedure details: wound care instructions given     Lentigines - Scattered tan macules - Due to sun exposure - Benign-appering, observe - Recommend daily broad spectrum sunscreen SPF 30+ to sun-exposed areas, reapply every 2 hours as needed. - Call for any changes  Seborrheic Keratoses - Stuck-on, waxy, tan-brown papules and/or plaques  - Benign-appearing - Discussed benign etiology and prognosis. - Observe - Call for any changes  Melanocytic Nevi - Tan-brown and/or pink-flesh-colored symmetric macules and papules - Benign appearing on exam today - Observation - Call clinic for new or changing moles - Recommend daily use of broad spectrum spf 30+ sunscreen to sun-exposed areas.   Hemangiomas - Red papules - Discussed benign nature - Observe - Call for any changes  Actinic Damage - Severe, confluent actinic changes with pre-cancerous actinic keratoses  - Severe, chronic, not at goal, secondary to cumulative UV radiation exposure over time - diffuse scaly erythematous macules and papules with underlying dyspigmentation - Discussed Prescription "Field Treatment" for Severe, Chronic Confluent Actinic Changes with Pre-Cancerous Actinic Keratoses Field treatment involves treatment of an entire area of skin that has confluent Actinic Changes (Sun/ Ultraviolet light damage) and PreCancerous Actinic Keratoses by method of PhotoDynamic Therapy (PDT) and/or prescription Topical Chemotherapy agents such as 5-fluorouracil, 5-fluorouracil/calcipotriene, and/or imiquimod.  The purpose is to decrease the number of clinically evident and subclinical PreCancerous lesions to prevent progression to development of skin cancer by chemically destroying early precancer changes that may or may not be visible.  It has been shown to reduce the risk of developing skin cancer in the treated area. As a result of treatment,  redness, scaling, crusting, and open sores may occur during treatment course. One  or more than one of these methods may be used and may have to be used several times to control, suppress and eliminate the PreCancerous changes. Discussed treatment course, expected reaction, and possible side effects. - Recommend daily broad spectrum sunscreen SPF 30+ to sun-exposed areas, reapply every 2 hours as needed.  - Staying in the shade or wearing long sleeves, sun glasses (UVA+UVB protection) and wide brim hats (4-inch brim around the entire circumference of the hat) are also recommended. - Call for new or changing lesions. - In 6 weeks schedule PDT of the entire face in specific the forehead and temples.  Spider Veins - Dilated blue, purple or red veins at the lower extremities - Reassured - These can be treated by sclerotherapy (a procedure to inject a medicine into the veins to make them disappear) if desired, but the treatment is not covered by insurance  Skin cancer screening performed today.  Return in about 1 year (around 08/08/2021) for PDT of the face in 6 weeks.  Luther Redo, CMA, am acting as scribe for Sarina Ser, MD .

## 2020-08-08 NOTE — Progress Notes (Signed)
Follow-Up Visit   Subjective  Sean Raymond is a 75 y.o. male who presents for the following: Annual Exam (Possible hx of skin CA on the back that was removed in [facility] many years ago). He has noticed a new lesion on his scalp that he would like checked today.  The patient presents for Total-Body Skin Exam (TBSE) for skin cancer screening and mole check.  The following portions of the chart were reviewed this encounter and updated as appropriate:   Tobacco  Allergies  Meds  Problems  Med Hx  Surg Hx  Fam Hx     Review of Systems:  No other skin or systemic complaints except as noted in HPI or Assessment and Plan.  Objective  Well appearing patient in no apparent distress; mood and affect are within normal limits.  A full examination was performed including scalp, head, eyes, ears, nose, lips, neck, chest, axillae, abdomen, back, buttocks, bilateral upper extremities, bilateral lower extremities, hands, feet, fingers, toes, fingernails, and toenails. All findings within normal limits unless otherwise noted below.  Objective  Face x 16 (16): Erythematous thin papules/macules with gritty scale.   Objective  R mastoid x 1: Erythematous keratotic or waxy stuck-on papule or plaque.   Assessment & Plan  AK (actinic keratosis) (16) Face x 16  Destruction of lesion - Face x 16 Complexity: simple   Destruction method: cryotherapy   Informed consent: discussed and consent obtained   Timeout:  patient name, date of birth, surgical site, and procedure verified Lesion destroyed using liquid nitrogen: Yes   Region frozen until ice ball extended beyond lesion: Yes   Outcome: patient tolerated procedure well with no complications   Post-procedure details: wound care instructions given    Inflamed seborrheic keratosis R mastoid x 1  Destruction of lesion - R mastoid x 1 Complexity: simple   Destruction method: cryotherapy   Informed consent: discussed and consent obtained    Timeout:  patient name, date of birth, surgical site, and procedure verified Lesion destroyed using liquid nitrogen: Yes   Region frozen until ice ball extended beyond lesion: Yes   Outcome: patient tolerated procedure well with no complications   Post-procedure details: wound care instructions given    Skin cancer screening  Lentigines - Scattered tan macules - Due to sun exposure - Benign-appering, observe - Recommend daily broad spectrum sunscreen SPF 30+ to sun-exposed areas, reapply every 2 hours as needed. - Call for any changes  Seborrheic Keratoses - Stuck-on, waxy, tan-brown papules and/or plaques  - Benign-appearing - Discussed benign etiology and prognosis. - Observe - Call for any changes  Melanocytic Nevi - Tan-brown and/or pink-flesh-colored symmetric macules and papules - Benign appearing on exam today - Observation - Call clinic for new or changing moles - Recommend daily use of broad spectrum spf 30+ sunscreen to sun-exposed areas.   Hemangiomas - Red papules - Discussed benign nature - Observe - Call for any changes  Actinic Damage - Chronic condition, secondary to cumulative UV/sun exposure - diffuse scaly erythematous macules with underlying dyspigmentation - Recommend daily broad spectrum sunscreen SPF 30+ to sun-exposed areas, reapply every 2 hours as needed.  - Staying in the shade or wearing long sleeves, sun glasses (UVA+UVB protection) and wide brim hats (4-inch brim around the entire circumference of the hat) are also recommended for sun protection.  - Call for new or changing lesions.  Skin cancer screening performed today.  Return in about 1 year (around 08/08/2021) for TBSE; in  6 weeks schedule PDT of the face.  Luther Redo, CMA, am acting as scribe for Sarina Ser, MD .  Documentation: I have reviewed the above documentation for accuracy and completeness, and I agree with the above.  Sarina Ser, MD

## 2020-08-12 ENCOUNTER — Encounter: Payer: Self-pay | Admitting: Dermatology

## 2020-09-19 ENCOUNTER — Inpatient Hospital Stay: Payer: Medicare Other

## 2020-09-20 ENCOUNTER — Other Ambulatory Visit: Payer: Self-pay

## 2020-09-20 ENCOUNTER — Inpatient Hospital Stay: Payer: Medicare Other | Attending: Internal Medicine

## 2020-09-20 DIAGNOSIS — Z452 Encounter for adjustment and management of vascular access device: Secondary | ICD-10-CM | POA: Diagnosis not present

## 2020-09-20 DIAGNOSIS — Z95828 Presence of other vascular implants and grafts: Secondary | ICD-10-CM

## 2020-09-20 DIAGNOSIS — C8211 Follicular lymphoma grade II, lymph nodes of head, face, and neck: Secondary | ICD-10-CM | POA: Insufficient documentation

## 2020-09-20 MED ORDER — SODIUM CHLORIDE 0.9% FLUSH
10.0000 mL | INTRAVENOUS | Status: DC | PRN
  Administered 2020-09-20: 10 mL via INTRAVENOUS
  Filled 2020-09-20: qty 10

## 2020-09-20 MED ORDER — HEPARIN SOD (PORK) LOCK FLUSH 100 UNIT/ML IV SOLN
500.0000 [IU] | Freq: Once | INTRAVENOUS | Status: AC
  Administered 2020-09-20: 500 [IU] via INTRAVENOUS
  Filled 2020-09-20: qty 5

## 2020-09-25 ENCOUNTER — Other Ambulatory Visit: Payer: Self-pay

## 2020-09-25 ENCOUNTER — Ambulatory Visit (INDEPENDENT_AMBULATORY_CARE_PROVIDER_SITE_OTHER): Payer: Medicare Other

## 2020-09-25 DIAGNOSIS — L570 Actinic keratosis: Secondary | ICD-10-CM | POA: Diagnosis not present

## 2020-09-25 MED ORDER — AMINOLEVULINIC ACID HCL 20 % EX SOLR
1.0000 "application " | Freq: Once | CUTANEOUS | Status: AC
  Administered 2020-09-25: 354 mg via TOPICAL

## 2020-09-25 NOTE — Patient Instructions (Signed)

## 2020-09-25 NOTE — Progress Notes (Signed)
Patient completed PDT therapy today.  1. AK (actinic keratosis) Head - Anterior (Face)  Photodynamic therapy - Head - Anterior (Face) Procedure discussed: discussed risks, benefits, side effects. and alternatives   Prep: site scrubbed/prepped with acetone   Location:  Face Number of lesions:  Multiple Type of treatment:  Blue light Aminolevulinic Acid (see MAR for details): Levulan Number of Levulan sticks used:  1 Incubation time (minutes):  60 Number of minutes under lamp:  16 Number of seconds under lamp:  40 Cooling:  Floor fan Outcome: patient tolerated procedure well with no complications   Post-procedure details: sunscreen applied    Aminolevulinic Acid HCl 20 % SOLR 354 mg - Head - Anterior (Face)

## 2020-09-26 ENCOUNTER — Ambulatory Visit: Payer: Medicare Other

## 2020-10-21 ENCOUNTER — Inpatient Hospital Stay: Payer: Medicare Other

## 2020-11-05 ENCOUNTER — Other Ambulatory Visit: Payer: Self-pay

## 2020-11-05 ENCOUNTER — Inpatient Hospital Stay: Payer: Medicare Other | Attending: Internal Medicine

## 2020-11-05 DIAGNOSIS — Z95828 Presence of other vascular implants and grafts: Secondary | ICD-10-CM

## 2020-11-05 DIAGNOSIS — C8211 Follicular lymphoma grade II, lymph nodes of head, face, and neck: Secondary | ICD-10-CM | POA: Diagnosis present

## 2020-11-05 DIAGNOSIS — Z452 Encounter for adjustment and management of vascular access device: Secondary | ICD-10-CM | POA: Insufficient documentation

## 2020-11-05 MED ORDER — SODIUM CHLORIDE 0.9% FLUSH
10.0000 mL | INTRAVENOUS | Status: DC | PRN
  Administered 2020-11-05: 10 mL via INTRAVENOUS
  Filled 2020-11-05: qty 10

## 2020-11-05 MED ORDER — HEPARIN SOD (PORK) LOCK FLUSH 100 UNIT/ML IV SOLN
500.0000 [IU] | Freq: Once | INTRAVENOUS | Status: AC
  Administered 2020-11-05: 500 [IU] via INTRAVENOUS
  Filled 2020-11-05: qty 5

## 2020-12-23 NOTE — Progress Notes (Signed)
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  He continues to feel well and remains asymptomatic. 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 He has no neurologic complaints.  He denies any fevers, weight loss, or night sweats.  He has noted no new lymphadenopathy.  He denies any chest pain, shortness of breath, cough, or hemoptysis.  He denies any nausea, vomiting, constipation, or diarrhea.  He has no urinary complaints.  Patient feels at his baseline and offers no further specific complaints today.    REVIEW OF SYSTEMS:   Review of Systems  Constitutional: Negative.  Negative for diaphoresis, fever, malaise/fatigue and weight loss.  Eyes:  Positive for double vision. Negative for blurred vision and pain.  Respiratory: Negative.  Negative for cough and shortness of breath.   Cardiovascular: Negative.  Negative for chest pain and leg swelling.  Gastrointestinal: Negative.  Negative for abdominal pain.  Genitourinary: Negative.  Negative for dysuria.  Musculoskeletal: Negative.  Negative for neck pain.  Skin: Negative.  Negative for rash.  Neurological: Negative.  Negative for sensory change, focal weakness, weakness and headaches.  Psychiatric/Behavioral: Negative.  The patient is not nervous/anxious.    As per HPI. Otherwise, a complete review of systems is negative.  PAST MEDICAL HISTORY: Past Medical History:  Diagnosis Date   Follicular lymphoma ([place]) 2009   Patient had chemo + rad tx's and zevaline shots./ no flare ups since 2015   GERD (gastroesophageal reflux disease)     Heart murmur    past finding by Dr Doy Hutching   Hemorrhoids    Hypercholesterolemia    Hypertension    started as a preventative   Seasonal allergies    Sleep apnea    CPAP   Transfusion history    Vertigo 5 yrs ago   positional vertigo    PAST SURGICAL HISTORY: Hemorrhoidectomy.  FAMILY HISTORY: Colon cancer and lung cancer.     ADVANCED DIRECTIVES:    HEALTH MAINTENANCE: Social History   Tobacco Use   Smoking status: Never   Smokeless tobacco: Never  Substance Use Topics   Alcohol use: Yes    Alcohol/week: 2.0 standard drinks    Types: 2 Cans of beer per week    Comment: occasional   Drug use: No     Allergies  Allergen Reactions   Tape Rash    bandaids    Current Outpatient Medications  Medication Sig Dispense Refill   alfuzosin (UROXATRAL) 10 MG 24 hr tablet Take 10 mg by mouth daily.     amLODipine (NORVASC) 5 MG tablet Take 5 mg by mouth daily.      aspirin EC 81 MG tablet Take 81 mg by mouth daily.      atorvastatin (LIPITOR) 10 MG tablet Take 10 mg by mouth daily at 6 PM.      Calcium Carbonate-Vitamin D 600-400 MG-UNIT per tablet Take 1 tablet by mouth daily.      cetirizine (ZYRTEC) 10 MG tablet Take 10 mg by mouth daily.     Multiple Vitamin (MULTI-VITAMINS) TABS Take 1 tablet by mouth daily.      MYRBETRIQ 50 MG TB24  tablet Take 50 mg by mouth daily.     omeprazole (PRILOSEC) 40 MG capsule Take 40 mg by mouth daily.      tadalafil (CIALIS) 20 MG tablet Take by mouth daily as needed.      tamsulosin (FLOMAX) 0.4 MG CAPS capsule Take 1 capsule by mouth daily.  (Patient not taking: Reported on 12/26/2020)     No current facility-administered medications for this visit.    OBJECTIVE: Vitals:   12/26/20 1355  BP: 128/72  Pulse: 75  Resp: 16  Temp: 97.9 F (36.6 C)  SpO2: 98%     Body mass index is 32.1 kg/m.    ECOG FS:0 - Asymptomatic  General: Well-developed, well-nourished, no acute distress. Eyes: Pink conjunctiva, anicteric  sclera. HEENT: Normocephalic, moist mucous membranes.  No palpable lymphadenopathy. Lungs: No audible wheezing or coughing. Heart: Regular rate and rhythm. Abdomen: Soft, nontender, no obvious distention. Musculoskeletal: No edema, cyanosis, or clubbing. Neuro: Alert, answering all questions appropriately. Cranial nerves grossly intact. Skin: No rashes or petechiae noted. Psych: Normal affect.   LAB RESULTS:  Lab Results  Component Value Date   NA 137 12/25/2020   K 3.6 12/25/2020   CL 105 12/25/2020   CO2 27 12/25/2020   GLUCOSE 103 (H) 12/25/2020   BUN 12 12/25/2020   CREATININE 0.95 12/25/2020   CALCIUM 8.6 (L) 12/25/2020   PROT 6.3 (L) 12/25/2020   ALBUMIN 4.2 12/25/2020   AST 24 12/25/2020   ALT 19 12/25/2020   ALKPHOS 68 12/25/2020   BILITOT 1.4 (H) 12/25/2020   GFRNONAA >60 12/25/2020   GFRAA >60 12/21/2019    Lab Results  Component Value Date   WBC 5.2 12/25/2020   NEUTROABS 3.1 12/25/2020   HGB 13.2 12/25/2020   HCT 38.5 (L) 12/25/2020   MCV 89.5 12/25/2020   PLT 147 (L) 12/25/2020     STUDIES: CT SOFT TISSUE NECK W CONTRAST  Result Date: 12/25/2020 CLINICAL DATA:  Hematologic malignancy, surveillance follow up follicular lymphoma EXAM: CT NECK WITH CONTRAST TECHNIQUE: Multidetector CT imaging of the neck was performed using the standard protocol following the bolus administration of intravenous contrast. CONTRAST:  178m OMNIPAQUE IOHEXOL 350 MG/ML SOLN COMPARISON:  None. FINDINGS: Pharynx and larynx: Mild generalized pharyngeal mucosal space soft tissue thickening appears similar. No discrete or enhancing pharyngeal or laryngeal mass. Salivary glands: Submandibular glands are within normal limits. Small nodular foci within bilateral parotid glands are unchanged. Punctate calculus within the left parotid gland, unchanged. Thyroid: Normal. Lymph nodes: Mildly increased size of lymph nodes at the left level 5 station, measuring 14-15 mm on series 5, images 75  and 77 (previously 11 x 14 mm). Contralateral right level 5 node appears similar in size. Mildly increased left supraclavicular conglomerate lymph node, measuring 13 x 27 mm on series 5, image 87, previously 13 x 22 mm. Chronically increased number of level 1 and level 2 nodes appear more similar, including a 12 mm short axis right level 2A node (series 5, image 52). Vascular: The major arteries in the neck are grossly patent. 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 Limited intracranial: Negative. Paranasal sinuses and visualized orbits: Evaluated on concurrent maxillofacial CT. Skeleton: Chronic multilevel degenerative change. Upper chest: See concurrent CT of the chest for intrathoracic evaluation. IMPRESSION: 1. Mild enlargement of chronic bilateral cervical chain nodes when compared to 2021, detailed above and suspicious for active lymphoma. 2. Concurrent maxillofacial CT  and CT chest/abdomen/pelvis are reported separately. Electronically Signed   By: Margaretha Sheffield M.D.   On: 12/25/2020 15:34   CT CHEST ABDOMEN PELVIS W CONTRAST  Result Date: 12/26/2020 CLINICAL DATA:  Follicular lymphoma.  Restaging. EXAM: CT CHEST, ABDOMEN, AND PELVIS WITH CONTRAST TECHNIQUE: Multidetector CT imaging of the chest, abdomen and pelvis was performed following the standard protocol during bolus administration of intravenous contrast. CONTRAST:  121m OMNIPAQUE IOHEXOL 350 MG/ML SOLN COMPARISON:  12/26/2019 FINDINGS: CT CHEST FINDINGS Cardiovascular: The heart size is normal. No substantial pericardial effusion. Right Port-A-Cath tip is positioned at the SVC/RA junction. Mediastinum/Nodes: Upper normal lymph nodes in the thoracic inlet bilaterally are stable. Index Similar increased number of prominent lymph nodes in both axillary regions. Index left axillary node measured previously at 10 mm short axis is 11  mm short axis today on 24/3. 11 mm short axis right axillary node visible on 13/3 today was 11 mm previously (remeasured). Prominent lymph nodes in the anterior mediastinum are not enlarged by CT size criteria and are stable in the interval. No hilar lymphadenopathy. The esophagus has normal imaging features. Lungs/Pleura: No suspicious pulmonary nodule or mass. Chronic atelectasis or scarring noted in the dependent lung bases. Musculoskeletal: No worrisome lytic or sclerotic osseous abnormality. Ill-defined soft tissue nodules in the anterior subcutaneous fat of the chest wall described previously are stable in the interval. CT ABDOMEN PELVIS FINDINGS Hepatobiliary: No suspicious focal abnormality within the liver parenchyma. Tiny calcified gallstone evident. No intrahepatic or extrahepatic biliary dilation. Pancreas: No focal mass lesion. No dilatation of the main duct. No intraparenchymal cyst. No peripancreatic edema. Spleen: Small hypodensity in the splenic dome is stable in the interval, most likely benign. Adrenals/Urinary Tract: Stable bilateral adrenal nodules comparing back to 12/14/2011 consistent with benign etiology such as adenomas. Tiny cyst again noted lower pole right kidney. Small hypoattenuating subcapsular lesions in the upper interpolar left kidney are too small to characterize but stable and likely benign. No evidence for hydroureter. The urinary bladder appears normal for the degree of distention. Stomach/Bowel: Stomach is unremarkable. No gastric wall thickening. No evidence of outlet obstruction. Duodenum is normally positioned as is the ligament of Treitz. No small bowel wall thickening. No small bowel dilatation. The terminal ileum is normal. The appendix is normal. No gross colonic mass. No colonic wall thickening. Diverticular changes are noted in the left colon without evidence of diverticulitis. Vascular/Lymphatic: There is mild atherosclerotic calcification of the abdominal aorta  without aneurysm. 10 mm short axis left para-aortic lymph node on 61/3 is mildly increased from 6 mm short axis (remeasured) on the prior study. 10 mm short axis gastrohepatic ligament lymph node is also slightly more prominent today. 9 mm short axis left external iliac node on 114/3 was 8 mm (remeasured) previously. 14 mm short axis posterior left mesenteric node measured previously is stable at 14 mm short axis today (80/3). Reproductive: Prostate gland is enlarged. Other: No intraperitoneal free fluid. Musculoskeletal: No worrisome lytic or sclerotic osseous abnormality. Tiny umbilical hernia again noted with stable small volume fluid/soft tissue attenuation. Subtle haziness in the central small bowel mesentery is unchanged. IMPRESSION: 1. Generally stable prominent lymph nodes in both axillary regions, thoracic inlets, posterior mesentery and pelvic sidewall. Gastrohepatic ligament node is minimally increased and a left para-aortic node is mildly increased, now measuring upper normal for size. 2. Stable bilateral adrenal nodules comparing back to 12/14/2011 consistent with benign etiology such as adenomas. 3. Cholelithiasis. 4. Left colonic diverticulosis without diverticulitis. 5. Prostatomegaly.  6. Aortic Atherosclerosis (ICD10-I70.0). Electronically Signed   By: Misty Stanley M.D.   On: 12/26/2020 08:36   CT MAXILLOFACIAL W CONTRAST  Result Date: 12/25/2020 CLINICAL DATA:  Hematologic malignancy, surveillance follow up follicular lymphoma EXAM: CT MAXILLOFACIAL WITH CONTRAST TECHNIQUE: Multidetector CT imaging of the maxillofacial structures was performed with intravenous contrast. Multiplanar CT image reconstructions were also generated. CONTRAST:  121m OMNIPAQUE IOHEXOL 350 MG/ML SOLN COMPARISON:  12/26/2019. FINDINGS: Osseous: No fracture or mandibular dislocation. No destructive process. Orbits: No orbital mass. Sinuses: Moderate mucosal thickening of the inferior left maxillary sinus. Mild mucosal  thickening of scattered ethmoid air cells. Soft tissues: No substantial change in numerous small nodules within bilateral parotids, presumably intraparotid lymph nodes. Similar bilateral submandibular lymph nodes, the largest measuring 8 mm in short axis on the left Limited intracranial: No significant or unexpected finding identified. IMPRESSION: Similar mild nodal prominence within the submandibular regions bilaterally and numerous subcentimeter intraparotid lymph nodes. Electronically Signed   By: FMargaretha SheffieldM.D.   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  CT scan results from December 25, 2020 reviewed independently and report as above with continued slow increase in size of cervical lymphadenopathy.  Patient remains asymptomatic and these lymph nodes are not palpable.  He has no other evidence of disease.  Continue active surveillance.  No intervention is needed at this time.  We will continue yearly imaging and evaluation at least through 2024 at which point patient will be 10 years removed from his treatments.  Could possibly extend monitoring if cervical lymph nodes continue to increase in size slowly.  Return to clinic in 1 year with repeat imaging and further evaluation.  2.  Port: Patient continues to maintain his port and does not wish to take it out at this time.  Continue port flushes every 6 weeks.  I spent a total of 20 minutes reviewing chart data, face-to-face evaluation with the patient, counseling and coordination of care as detailed above.   Patient expressed understanding and was in agreement with this plan. He also understands that He can call clinic at any time with any questions, concerns, or complaints.    TLloyd Huger MD   12/28/2020 12:17 PM

## 2020-12-25 ENCOUNTER — Ambulatory Visit
Admission: RE | Admit: 2020-12-25 | Discharge: 2020-12-25 | Disposition: A | Discharge status: Home / Self Care | Payer: Medicare Other | Source: Ambulatory Visit | Attending: Oncology | Admitting: Oncology

## 2020-12-25 ENCOUNTER — Other Ambulatory Visit: Payer: Self-pay

## 2020-12-25 ENCOUNTER — Inpatient Hospital Stay: Payer: Medicare Other | Attending: Oncology

## 2020-12-25 DIAGNOSIS — C8211 Follicular lymphoma grade II, lymph nodes of head, face, and neck: Secondary | ICD-10-CM

## 2020-12-25 DIAGNOSIS — Z79899 Other long term (current) drug therapy: Secondary | ICD-10-CM | POA: Insufficient documentation

## 2020-12-25 DIAGNOSIS — Z801 Family history of malignant neoplasm of trachea, bronchus and lung: Secondary | ICD-10-CM | POA: Insufficient documentation

## 2020-12-25 DIAGNOSIS — C8210 Follicular lymphoma grade II, unspecified site: Secondary | ICD-10-CM | POA: Insufficient documentation

## 2020-12-25 LAB — LACTATE DEHYDROGENASE: LDH: 154 U/L (ref 98–192)

## 2020-12-25 LAB — COMPREHENSIVE METABOLIC PANEL
ALT: 19 U/L (ref 0–44)
AST: 24 U/L (ref 15–41)
Albumin: 4.2 g/dL (ref 3.5–5.0)
Alkaline Phosphatase: 68 U/L (ref 38–126)
Anion gap: 5 (ref 5–15)
BUN: 12 mg/dL (ref 8–23)
CO2: 27 mmol/L (ref 22–32)
Calcium: 8.6 mg/dL — ABNORMAL LOW (ref 8.9–10.3)
Chloride: 105 mmol/L (ref 98–111)
Creatinine, Ser: 0.95 mg/dL (ref 0.61–1.24)
GFR, Estimated: >60 mL/min (ref >60)
Glucose, Bld: 103 mg/dL — ABNORMAL HIGH (ref 70–99)
Potassium: 3.6 mmol/L (ref 3.5–5.1)
Sodium: 137 mmol/L (ref 135–145)
Total Bilirubin: 1.4 mg/dL — ABNORMAL HIGH (ref 0.3–1.2)
Total Protein: 6.3 g/dL — ABNORMAL LOW (ref 6.5–8.1)

## 2020-12-25 LAB — CBC WITH DIFFERENTIAL/PLATELET
Abs Immature Granulocytes: 0.02 10*3/uL (ref 0.00–0.07)
Basophils Absolute: 0.1 10*3/uL (ref 0.0–0.1)
Basophils Relative: 1 %
Eosinophils Absolute: 0.1 10*3/uL (ref 0.0–0.5)
Eosinophils Relative: 2 %
HCT: 38.5 % — ABNORMAL LOW (ref 39.0–52.0)
Hemoglobin: 13.2 g/dL (ref 13.0–17.0)
Immature Granulocytes: 0 %
Lymphocytes Relative: 28 %
Lymphs Abs: 1.5 10*3/uL (ref 0.7–4.0)
MCH: 30.7 pg (ref 26.0–34.0)
MCHC: 34.3 g/dL (ref 30.0–36.0)
MCV: 89.5 fL (ref 80.0–100.0)
Monocytes Absolute: 0.6 10*3/uL (ref 0.1–1.0)
Monocytes Relative: 11 %
Neutro Abs: 3.1 10*3/uL (ref 1.7–7.7)
Neutrophils Relative %: 58 %
Platelets: 147 10*3/uL — ABNORMAL LOW (ref 150–400)
RBC: 4.3 MIL/uL (ref 4.22–5.81)
RDW: 13.7 % (ref 11.5–15.5)
WBC: 5.2 10*3/uL (ref 4.0–10.5)
nRBC: 0 % (ref 0.0–0.2)

## 2020-12-25 MED ORDER — IOHEXOL 350 MG/ML SOLN
150.0000 mL | Freq: Once | INTRAVENOUS | Status: AC | PRN
  Administered 2020-12-25: 100 mL via INTRAVENOUS

## 2020-12-26 ENCOUNTER — Inpatient Hospital Stay (HOSPITAL_BASED_OUTPATIENT_CLINIC_OR_DEPARTMENT_OTHER): Payer: Medicare Other | Admitting: Oncology

## 2020-12-26 VITALS — BP 128/72 | HR 75 | Temp 97.9°F | Resp 16 | Wt 214.2 lb

## 2020-12-26 DIAGNOSIS — Z801 Family history of malignant neoplasm of trachea, bronchus and lung: Secondary | ICD-10-CM | POA: Diagnosis not present

## 2020-12-26 DIAGNOSIS — C8211 Follicular lymphoma grade II, lymph nodes of head, face, and neck: Secondary | ICD-10-CM | POA: Diagnosis not present

## 2020-12-26 DIAGNOSIS — Z79899 Other long term (current) drug therapy: Secondary | ICD-10-CM | POA: Diagnosis not present

## 2020-12-26 DIAGNOSIS — C8210 Follicular lymphoma grade II, unspecified site: Secondary | ICD-10-CM | POA: Diagnosis present

## 2020-12-26 NOTE — Progress Notes (Signed)
Pt has no new concerns at this time. 

## 2020-12-27 ENCOUNTER — Ambulatory Visit: Payer: Medicare Other | Admitting: Oncology

## 2021-02-10 ENCOUNTER — Other Ambulatory Visit: Payer: Self-pay

## 2021-02-10 ENCOUNTER — Encounter: Payer: Self-pay | Admitting: Dermatology

## 2021-02-10 ENCOUNTER — Ambulatory Visit (INDEPENDENT_AMBULATORY_CARE_PROVIDER_SITE_OTHER): Payer: Medicare Other | Admitting: Dermatology

## 2021-02-10 DIAGNOSIS — L570 Actinic keratosis: Secondary | ICD-10-CM | POA: Diagnosis not present

## 2021-02-10 DIAGNOSIS — C44629 Squamous cell carcinoma of skin of left upper limb, including shoulder: Secondary | ICD-10-CM | POA: Diagnosis not present

## 2021-02-10 DIAGNOSIS — D489 Neoplasm of uncertain behavior, unspecified: Secondary | ICD-10-CM

## 2021-02-10 DIAGNOSIS — L820 Inflamed seborrheic keratosis: Secondary | ICD-10-CM

## 2021-02-10 DIAGNOSIS — L578 Other skin changes due to chronic exposure to nonionizing radiation: Secondary | ICD-10-CM | POA: Diagnosis not present

## 2021-02-10 DIAGNOSIS — C4492 Squamous cell carcinoma of skin, unspecified: Secondary | ICD-10-CM

## 2021-02-10 HISTORY — DX: Squamous cell carcinoma of skin, unspecified: C44.92

## 2021-02-10 NOTE — Patient Instructions (Addendum)
Electrodesiccation and Curettage ("Scrape and Burn") Wound Care Instructions  Leave the original bandage on for 24 hours if possible.  If the bandage becomes soaked or soiled before that time, it is OK to remove it and examine the wound.  A small amount of post-operative bleeding is normal.  If excessive bleeding occurs, remove the bandage, place gauze over the site and apply continuous pressure (no peeking) over the area for 30 minutes. If this does not work, please call our clinic as soon as possible or page your doctor if it is after hours.   Once a day, cleanse the wound with soap and water. It is fine to shower. If a thick crust develops you may use a Q-tip dipped into dilute hydrogen peroxide (mix 1:1 with water) to dissolve it.  Hydrogen peroxide can slow the healing process, so use it only as needed.    After washing, apply petroleum jelly (Vaseline) or an antibiotic ointment if your doctor prescribed one for you, followed by a bandage.    For best healing, the wound should be covered with a layer of ointment at all times. If you are not able to keep the area covered with a bandage to hold the ointment in place, this may mean re-applying the ointment several times a day.  Continue this wound care until the wound has healed and is no longer open. It may take several weeks for the wound to heal and close.  Itching and mild discomfort is normal during the healing process.  If you have any discomfort, you can take Tylenol (acetaminophen) or ibuprofen as directed on the bottle. (Please do not take these if you have an allergy to them or cannot take them for another reason).  Some redness, tenderness and white or yellow material in the wound is normal healing.  If the area becomes very sore and red, or develops a thick yellow-green material (pus), it may be infected; please notify us.    Wound healing continues for up to one year following surgery. It is not unusual to experience pain in the scar  from time to time during the interval.  If the pain becomes severe or the scar thickens, you should notify the office.    A slight amount of redness in a scar is expected for the first six months.  After six months, the redness will fade and the scar will soften and fade.  The color difference becomes less noticeable with time.  If there are any problems, return for a post-op surgery check at your earliest convenience.  To improve the appearance of the scar, you can use silicone scar gel, cream, or sheets (such as Mederma or Serica) every night for up to one year. These are available over the counter (without a prescription).  Please call our office at [phone] for any questions or concerns. Biopsy Wound Care Instructions  Leave the original bandage on for 24 hours if possible.  If the bandage becomes soaked or soiled before that time, it is OK to remove it and examine the wound.  A small amount of post-operative bleeding is normal.  If excessive bleeding occurs, remove the bandage, place gauze over the site and apply continuous pressure (no peeking) over the area for 30 minutes. If this does not work, please call our clinic as soon as possible or page your doctor if it is after hours.   Once a day, cleanse the wound with soap and water. It is fine to shower. If a  thick crust develops you may use a Q-tip dipped into dilute hydrogen peroxide (mix 1:1 with water) to dissolve it.  Hydrogen peroxide can slow the healing process, so use it only as needed.    After washing, apply petroleum jelly (Vaseline) or an antibiotic ointment if your doctor prescribed one for you, followed by a bandage.    For best healing, the wound should be covered with a layer of ointment at all times. If you are not able to keep the area covered with a bandage to hold the ointment in place, this may mean re-applying the ointment several times a day.  Continue this wound care until the wound has healed and is no longer open.    Itching and mild discomfort is normal during the healing process. However, if you develop pain or severe itching, please call our office.   If you have any discomfort, you can take Tylenol (acetaminophen) or ibuprofen as directed on the bottle. (Please do not take these if you have an allergy to them or cannot take them for another reason).  Some redness, tenderness and white or yellow material in the wound is normal healing.  If the area becomes very sore and red, or develops a thick yellow-green material (pus), it may be infected; please notify us.    If you have stitches, return to clinic as directed to have the stitches removed. You will continue wound care for 2-3 days after the stitches are removed.   Wound healing continues for up to one year following surgery. It is not unusual to experience pain in the scar from time to time during the interval.  If the pain becomes severe or the scar thickens, you should notify the office.    A slight amount of redness in a scar is expected for the first six months.  After six months, the redness will fade and the scar will soften and fade.  The color difference becomes less noticeable with time.  If there are any problems, return for a post-op surgery check at your earliest convenience.  To improve the appearance of the scar, you can use silicone scar gel, cream, or sheets (such as Mederma or Serica) every night for up to one year. These are available over the counter (without a prescription).  Please call our office at [phone] for any questions or concerns.             Cryotherapy Aftercare  Wash gently with soap and water everyday.   Apply Vaseline and Band-Aid daily until healed.    Actinic keratoses are precancerous spots that appear secondary to cumulative UV radiation exposure/sun exposure over time. They are chronic with expected duration over 1 year. A portion of actinic keratoses will progress to squamous cell carcinoma  of the skin. It is not possible to reliably predict which spots will progress to skin cancer and so treatment is recommended to prevent development of skin cancer.  Recommend daily broad spectrum sunscreen SPF 30+ to sun-exposed areas, reapply every 2 hours as needed.  Recommend staying in the shade or wearing long sleeves, sun glasses (UVA+UVB protection) and wide brim hats (4-inch brim around the entire circumference of the hat). Call for new or changing lesions. If you have any questions or concerns for your doctor, please call our main line at [phone] and press option 4 to reach your doctor's medical assistant. If no one answers, please leave a voicemail as directed and we will return your call as soon as possible.  Messages left after 4 pm will be answered the following business day.   You may also send [place] a message via [place]. We typically respond to MyChart messages within 1-2 business days.  For prescription refills, please ask your pharmacy to contact our office. Our fax number is [phone].  If you have an urgent issue when the clinic is closed that cannot wait until the next business day, you can page your doctor at the number below.    Please note that while we do our best to be available for urgent issues outside of office hours, we are not available 24/7.   If you have an urgent issue and are unable to reach [place], you may choose to seek medical care at your doctor's office, retail clinic, urgent care center, or emergency room.  If you have a medical emergency, please immediately call 911 or go to the emergency department.  Pager Numbers  - Dr. Nehemiah Massed: [phone]  - Dr. Laurence Ferrari: [phone]  - Dr. Nicole Kindred: [phone]  In the event of inclement weather, please call our main line at [phone] for an update on the status of any delays or closures.  Dermatology Medication Tips: Please keep the boxes that topical medications come in in order to help keep track of the  instructions about where and how to use these. Pharmacies typically print the medication instructions only on the boxes and not directly on the medication tubes.   If your medication is too expensive, please contact our office at [phone] option 4 or send [place] a message through [place].   We are unable to tell what your co-pay for medications will be in advance as this is different depending on your insurance coverage. However, we may be able to find a substitute medication at lower cost or fill out paperwork to get insurance to cover a needed medication.   If a prior authorization is required to get your medication covered by your insurance company, please allow [place] 1-2 business days to complete this process.  Drug prices often vary depending on where the prescription is filled and some pharmacies may offer cheaper prices.  The website www.goodrx.com contains coupons for medications through different pharmacies. The prices here do not account for what the cost may be with help from insurance (it may be cheaper with your insurance), but the website can give you the price if you did not use any insurance.  - You can print the associated coupon and take it with your prescription to the pharmacy.  - You may also stop by our office during regular business hours and pick up a GoodRx coupon card.  - If you need your prescription sent electronically to a different pharmacy, notify our office through [facility] or by phone at [phone] option 4.

## 2021-02-10 NOTE — Progress Notes (Signed)
Follow-Up Visit   Subjective  Sean Raymond is a 75 y.o. male who presents for the following: Follow-up (Patient here today concerning a spot at left hand that he cut a month ago. He states area is not healing. ). He has other spots to be checked today.  The following portions of the chart were reviewed this encounter and updated as appropriate:  Tobacco  Allergies  Meds  Problems  Med Hx  Surg Hx  Fam Hx     Review of Systems: No other skin or systemic complaints except as noted in HPI or Assessment and Plan.  Objective  Well appearing patient in no apparent distress; mood and affect are within normal limits.  A focused examination was performed including face, left arm , left hand. Relevant physical exam findings are noted in the Assessment and Plan.  left hand base of hand 1.7 cm hyperkeratotic papule        left hand x 1 Erythematous thin papules/macules with gritty scale.   left arm x 3, right mastoid x 1, left mastoid x 1 (5) Erythematous keratotic or waxy stuck-on papule or plaque.   Assessment & Plan  Neoplasm of uncertain behavior left hand base of hand  Epidermal / dermal shaving  Lesion diameter (cm):  1.7 Informed consent: discussed and consent obtained   Timeout: patient name, date of birth, surgical site, and procedure verified   Procedure prep:  Patient was prepped and draped in usual sterile fashion Prep type:  Isopropyl alcohol Anesthesia: the lesion was anesthetized in a standard fashion   Anesthetic:  1% lidocaine w/ epinephrine 1-100,000 buffered w/ 8.4% NaHCO3 Instrument used: flexible razor blade   Hemostasis achieved with: pressure, aluminum chloride and electrodesiccation   Outcome: patient tolerated procedure well   Post-procedure details: sterile dressing applied and wound care instructions given   Dressing type: bandage and petrolatum    Destruction of lesion Complexity: extensive   Destruction method: electrodesiccation  and curettage   Informed consent: discussed and consent obtained   Timeout:  patient name, date of birth, surgical site, and procedure verified Procedure prep:  Patient was prepped and draped in usual sterile fashion Prep type:  Isopropyl alcohol Anesthesia: the lesion was anesthetized in a standard fashion   Anesthetic:  1% lidocaine w/ epinephrine 1-100,000 buffered w/ 8.4% NaHCO3 Curettage performed in three different directions: Yes   Electrodesiccation performed over the curetted area: Yes   Lesion length (cm):  1.7 Lesion width (cm):  1.7 Margin per side (cm):  0.2 Final wound size (cm):  2.1 Hemostasis achieved with:  pressure, aluminum chloride and electrodesiccation Outcome: patient tolerated procedure well with no complications   Post-procedure details: sterile dressing applied and wound care instructions given   Dressing type: bandage and petrolatum    Specimen 1 - Surgical pathology Differential Diagnosis: r/o scc  Check Margins: No 1.7 cm hyperkeratotic papule  R/o scc  Actinic keratosis left hand x 1  Actinic keratoses are precancerous spots that appear secondary to cumulative UV radiation exposure/sun exposure over time. They are chronic with expected duration over 1 year. A portion of actinic keratoses will progress to squamous cell carcinoma of the skin. It is not possible to reliably predict which spots will progress to skin cancer and so treatment is recommended to prevent development of skin cancer.  Recommend daily broad spectrum sunscreen SPF 30+ to sun-exposed areas, reapply every 2 hours as needed.  Recommend staying in the shade or wearing long sleeves, sun glasses (  UVA+UVB protection) and wide brim hats (4-inch brim around the entire circumference of the hat). Call for new or changing lesions.  Destruction of lesion - left hand x 1 Complexity: simple   Destruction method: cryotherapy   Informed consent: discussed and consent obtained   Timeout:  patient  name, date of birth, surgical site, and procedure verified Lesion destroyed using liquid nitrogen: Yes   Region frozen until ice ball extended beyond lesion: Yes   Outcome: patient tolerated procedure well with no complications   Post-procedure details: wound care instructions given    Inflamed seborrheic keratosis left arm x 3, right mastoid x 1, left mastoid x 1  Destruction of lesion - left arm x 3, right mastoid x 1, left mastoid x 1 Complexity: simple   Destruction method: cryotherapy   Informed consent: discussed and consent obtained   Timeout:  patient name, date of birth, surgical site, and procedure verified Lesion destroyed using liquid nitrogen: Yes   Region frozen until ice ball extended beyond lesion: Yes   Outcome: patient tolerated procedure well with no complications   Post-procedure details: wound care instructions given    Actinic Damage - chronic, secondary to cumulative UV radiation exposure/sun exposure over time - diffuse scaly erythematous macules with underlying dyspigmentation - Recommend daily broad spectrum sunscreen SPF 30+ to sun-exposed areas, reapply every 2 hours as needed.  - Recommend staying in the shade or wearing long sleeves, sun glasses (UVA+UVB protection) and wide brim hats (4-inch brim around the entire circumference of the hat). - Call for new or changing lesions.  Return for 3 month ak and biopsy recheck . IRuthell Rummage, CMA, am acting as scribe for Sarina Ser, MD. Documentation: I have reviewed the above documentation for accuracy and completeness, and I agree with the above.  Sarina Ser, MD

## 2021-02-12 ENCOUNTER — Telehealth: Payer: Self-pay

## 2021-02-12 NOTE — Telephone Encounter (Signed)
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

## 2021-02-12 NOTE — Telephone Encounter (Signed)
Advised patient of results/hd  

## 2021-02-20 ENCOUNTER — Other Ambulatory Visit: Payer: Self-pay

## 2021-02-20 ENCOUNTER — Inpatient Hospital Stay: Payer: Medicare Other | Attending: Oncology

## 2021-02-20 DIAGNOSIS — Z95828 Presence of other vascular implants and grafts: Secondary | ICD-10-CM

## 2021-02-20 DIAGNOSIS — Z8572 Personal history of non-Hodgkin lymphomas: Secondary | ICD-10-CM | POA: Insufficient documentation

## 2021-02-20 DIAGNOSIS — Z452 Encounter for adjustment and management of vascular access device: Secondary | ICD-10-CM | POA: Diagnosis not present

## 2021-02-20 MED ORDER — SODIUM CHLORIDE 0.9% FLUSH
10.0000 mL | Freq: Once | INTRAVENOUS | Status: AC
  Administered 2021-02-20: 15:00:00 10 mL via INTRAVENOUS
  Filled 2021-02-20: qty 10

## 2021-02-20 MED ORDER — HEPARIN SOD (PORK) LOCK FLUSH 100 UNIT/ML IV SOLN
500.0000 [IU] | Freq: Once | INTRAVENOUS | Status: AC
  Administered 2021-02-20: 15:00:00 500 [IU] via INTRAVENOUS
  Filled 2021-02-20: qty 5

## 2021-02-20 MED ORDER — HEPARIN SOD (PORK) LOCK FLUSH 100 UNIT/ML IV SOLN
INTRAVENOUS | Status: AC
  Filled 2021-02-20: qty 5

## 2021-04-17 ENCOUNTER — Inpatient Hospital Stay: Payer: Medicare Other | Attending: Oncology

## 2021-04-17 ENCOUNTER — Other Ambulatory Visit: Payer: Self-pay

## 2021-04-17 DIAGNOSIS — Z452 Encounter for adjustment and management of vascular access device: Secondary | ICD-10-CM | POA: Diagnosis not present

## 2021-04-17 DIAGNOSIS — C8211 Follicular lymphoma grade II, lymph nodes of head, face, and neck: Secondary | ICD-10-CM | POA: Insufficient documentation

## 2021-04-17 DIAGNOSIS — Z95828 Presence of other vascular implants and grafts: Secondary | ICD-10-CM

## 2021-04-17 MED ORDER — HEPARIN SOD (PORK) LOCK FLUSH 100 UNIT/ML IV SOLN
500.0000 [IU] | Freq: Once | INTRAVENOUS | Status: AC
  Administered 2021-04-17: 500 [IU] via INTRAVENOUS
  Filled 2021-04-17: qty 5

## 2021-04-17 MED ORDER — SODIUM CHLORIDE 0.9% FLUSH
10.0000 mL | Freq: Once | INTRAVENOUS | Status: DC
  Filled 2021-04-17: qty 10

## 2021-04-17 MED ORDER — SODIUM CHLORIDE 0.9% FLUSH
10.0000 mL | INTRAVENOUS | Status: DC | PRN
Start: 2021-04-17 — End: 2021-04-17
  Administered 2021-04-17: 10 mL via INTRAVENOUS
  Filled 2021-04-17: qty 10

## 2021-04-17 MED ORDER — HEPARIN SOD (PORK) LOCK FLUSH 100 UNIT/ML IV SOLN
500.0000 [IU] | Freq: Once | INTRAVENOUS | Status: DC
  Filled 2021-04-17: qty 5

## 2021-05-19 ENCOUNTER — Ambulatory Visit: Payer: Medicare Other | Admitting: Dermatology

## 2021-06-02 ENCOUNTER — Ambulatory Visit (INDEPENDENT_AMBULATORY_CARE_PROVIDER_SITE_OTHER): Payer: Medicare Other | Admitting: Dermatology

## 2021-06-02 ENCOUNTER — Other Ambulatory Visit: Payer: Self-pay

## 2021-06-02 DIAGNOSIS — L820 Inflamed seborrheic keratosis: Secondary | ICD-10-CM

## 2021-06-02 DIAGNOSIS — L821 Other seborrheic keratosis: Secondary | ICD-10-CM

## 2021-06-02 DIAGNOSIS — L578 Other skin changes due to chronic exposure to nonionizing radiation: Secondary | ICD-10-CM

## 2021-06-02 DIAGNOSIS — Z85828 Personal history of other malignant neoplasm of skin: Secondary | ICD-10-CM

## 2021-06-02 DIAGNOSIS — L570 Actinic keratosis: Secondary | ICD-10-CM

## 2021-06-02 NOTE — Patient Instructions (Signed)
Actinic keratoses are precancerous spots that appear secondary to cumulative UV radiation exposure/sun exposure over time. They are chronic with expected duration over 1 year. A portion of actinic keratoses will progress to squamous cell carcinoma of the skin. It is not possible to reliably predict which spots will progress to skin cancer and so treatment is recommended to prevent development of skin cancer.  Recommend daily broad spectrum sunscreen SPF 30+ to sun-exposed areas, reapply every 2 hours as needed.  Recommend staying in the shade or wearing long sleeves, sun glasses (UVA+UVB protection) and wide brim hats (4-inch brim around the entire circumference of the hat). Call for new or changing lesions.    Cryotherapy Aftercare  Wash gently with soap and water everyday.   Apply Vaseline and Band-Aid daily until healed.    Seborrheic Keratosis  What causes seborrheic keratoses? Seborrheic keratoses are harmless, common skin growths that first appear during adult life.  As time goes by, more growths appear.  Some people may develop a large number of them.  Seborrheic keratoses appear on both covered and uncovered body parts.  They are not caused by sunlight.  The tendency to develop seborrheic keratoses can be inherited.  They vary in color from skin-colored to gray, brown, or even black.  They can be either smooth or have a rough, warty surface.   Seborrheic keratoses are superficial and look as if they were stuck on the skin.  Under the microscope this type of keratosis looks like layers upon layers of skin.  That is why at times the top layer may seem to fall off, but the rest of the growth remains and re-grows.    Treatment Seborrheic keratoses do not need to be treated, but can easily be removed in the office.  Seborrheic keratoses often cause symptoms when they rub on clothing or jewelry.  Lesions can be in the way of shaving.  If they become inflamed, they can cause itching, soreness, or  burning.  Removal of a seborrheic keratosis can be accomplished by freezing, burning, or surgery. If any spot bleeds, scabs, or grows rapidly, please return to have it checked, as these can be an indication of a skin cancer.      If You Need Anything After Your Visit  If you have any questions or concerns for your doctor, please call our main line at [phone] and press option 4 to reach your doctor's medical assistant. If no one answers, please leave a voicemail as directed and we will return your call as soon as possible. Messages left after 4 pm will be answered the following business day.   You may also send [place] a message via [place]. We typically respond to MyChart messages within 1-2 business days.  For prescription refills, please ask your pharmacy to contact our office. Our fax number is [phone].  If you have an urgent issue when the clinic is closed that cannot wait until the next business day, you can page your doctor at the number below.    Please note that while we do our best to be available for urgent issues outside of office hours, we are not available 24/7.   If you have an urgent issue and are unable to reach [place], you may choose to seek medical care at your doctor's office, retail clinic, urgent care center, or emergency room.  If you have a medical emergency, please immediately call 911 or go to the emergency department.  Pager Numbers  - Dr. Nehemiah Massed: [phone]  -  Dr. Laurence Ferrari: [phone]  - Dr. Nicole Kindred: [phone]  In the event of inclement weather, please call our main line at [phone] for an update on the status of any delays or closures.  Dermatology Medication Tips: Please keep the boxes that topical medications come in in order to help keep track of the instructions about where and how to use these. Pharmacies typically print the medication instructions only on the boxes and not directly on the medication tubes.   If your medication is too  expensive, please contact our office at [phone] option 4 or send [place] a message through [place].   We are unable to tell what your co-pay for medications will be in advance as this is different depending on your insurance coverage. However, we may be able to find a substitute medication at lower cost or fill out paperwork to get insurance to cover a needed medication.   If a prior authorization is required to get your medication covered by your insurance company, please allow [place] 1-2 business days to complete this process.  Drug prices often vary depending on where the prescription is filled and some pharmacies may offer cheaper prices.  The website www.goodrx.com contains coupons for medications through different pharmacies. The prices here do not account for what the cost may be with help from insurance (it may be cheaper with your insurance), but the website can give you the price if you did not use any insurance.  - You can print the associated coupon and take it with your prescription to the pharmacy.  - You may also stop by our office during regular business hours and pick up a GoodRx coupon card.  - If you need your prescription sent electronically to a different pharmacy, notify our office through [facility] or by phone at [phone] option 4.     Si Usted Necesita Algo Despus de Su Visita  Tambin puede enviarnos un mensaje a travs de Pharmacist, community. Por lo general respondemos a los mensajes de MyChart en el transcurso de 1 a 2 das hbiles.  Para renovar recetas, por favor pida a su farmacia que se ponga en contacto con nuestra oficina. Harland Dingwall de fax es [place] [phone].  Si tiene un asunto urgente cuando la clnica est cerrada y que no puede esperar hasta el siguiente da hbil, puede llamar/localizar a su doctor(a) al nmero que aparece a continuacin.   Por favor, tenga en cuenta que aunque hacemos todo lo posible para estar disponibles para asuntos urgentes fuera  del horario de [place], no estamos disponibles las 24 horas del da, los 7 das de la [place].   Si tiene un problema urgente y no puede comunicarse con nosotros, puede optar por buscar atencin mdica  en el consultorio de su doctor(a), en una clnica privada, en un centro de atencin urgente o en una sala de emergencias.  Si tiene Engineering geologist, por favor llame inmediatamente al 911 o vaya a la sala de emergencias.  Nmeros de bper  - Dr. Nehemiah Massed: [phone]  - Dra. Moye: [phone]  - Dra. Nicole Kindred: [phone]  En caso de inclemencias del [place], por favor llame a Johnsie Kindred principal al [phone] para una actualizacin sobre el [place] de cualquier retraso o cierre.  Consejos para la medicacin en dermatologa: Por favor, guarde las cajas en las que vienen los medicamentos de uso tpico para ayudarle a seguir las instrucciones sobre dnde y cmo usarlos. Las farmacias generalmente imprimen las instrucciones del medicamento slo en las cajas y  no directamente en los tubos del medicamento.   Si su medicamento es muy caro, por favor, pngase en contacto con Zigmund Daniel llamando al [phone] y presione la opcin 4 o envenos un mensaje a travs de Pharmacist, community.   No podemos decirle cul ser su copago por los medicamentos por adelantado ya que esto es diferente dependiendo de la cobertura de su seguro. Sin embargo, es posible que podamos encontrar un medicamento sustituto a Electrical engineer un formulario para que el seguro cubra el medicamento que se considera necesario.   Si se requiere una autorizacin previa para que su compaa de seguros [place] su medicamento, por favor permtanos de 1 a 2 das hbiles para completar este proceso.  Los precios de los medicamentos varan con frecuencia dependiendo del Environmental consultant de dnde se surte la receta y alguna farmacias pueden ofrecer precios ms baratos.  El sitio web www.goodrx.com tiene cupones para medicamentos de Office manager. Los precios aqu no tienen en cuenta lo que podra costar con la ayuda del seguro (puede ser ms barato con su seguro), pero el sitio web puede darle el precio si no utiliz Research scientist (physical sciences).  - Puede imprimir el cupn correspondiente y llevarlo con su receta a la farmacia.  - Tambin puede pasar por nuestra oficina durante el horario de atencin regular y Charity fundraiser una tarjeta de cupones de GoodRx.  - Si necesita que su receta se enve electrnicamente a una farmacia diferente, informe a nuestra oficina a travs de MyChart de [place] o por telfono llamando al [phone] y presione la opcin 4.

## 2021-06-02 NOTE — Progress Notes (Signed)
Follow-Up Visit   Subjective  Sean Raymond is a 76 y.o. male who presents for the following: Follow-up (Patient here today for 3 month ak follow up and to recheck biopsy  left hand base of hand that was proven scc. Patient reports that has a spot at right neck, spot at right shoulder, and some spots at right forehead. ). The patient has spots, moles and lesions to be evaluated, some may be new or changing and the patient has concerns that these could be cancer.  The following portions of the chart were reviewed this encounter and updated as appropriate:  Tobacco   Allergies   Meds   Problems   Med Hx   Surg Hx   Fam Hx      Review of Systems: No other skin or systemic complaints except as noted in HPI or Assessment and Plan.  Objective  Well appearing patient in no apparent distress; mood and affect are within normal limits.  A focused examination was performed including face, scalp, arms , back, right shoulder . Relevant physical exam findings are noted in the Assessment and Plan.  right temple within hairline x 2, right neck x 1, right shoulder x 1, back x 1 (5) Erythematous stuck-on, waxy papule or plaque  face and neck x 10 (10) Erythematous thin papules/macules with gritty scale.    Assessment & Plan  Inflamed seborrheic keratosis (5) right temple within hairline x 2, right neck x 1, right shoulder x 1, back x 1 Irritated and bothersome to patient  Destruction of lesion - right temple within hairline x 2, right neck x 1, right shoulder x 1, back x 1 Complexity: simple   Destruction method: cryotherapy   Informed consent: discussed and consent obtained   Timeout:  patient name, date of birth, surgical site, and procedure verified Lesion destroyed using liquid nitrogen: Yes   Region frozen until ice ball extended beyond lesion: Yes   Outcome: patient tolerated procedure well with no complications   Post-procedure details: wound care instructions given   Additional  details:  Prior to procedure, discussed risks of blister formation, small wound, skin dyspigmentation, or rare scar following cryotherapy. Recommend Vaseline ointment to treated areas while healing.  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 They are chronic with expected duration over 1 year. A portion of actinic keratoses will progress to squamous cell carcinoma of the skin. It is not possible to reliably predict which spots will progress to skin cancer and so treatment is recommended to prevent development of skin cancer.  Recommend daily broad spectrum sunscreen SPF 30+ to sun-exposed areas, reapply every 2 hours as needed.  Recommend staying in the shade or wearing long sleeves, sun glasses (UVA+UVB protection) and wide brim hats (4-inch brim around the entire circumference of the hat). Call for new or changing lesions.  Destruction of lesion - face and neck x 10 Complexity: simple   Destruction method: cryotherapy   Informed consent: discussed and consent obtained   Timeout:  patient name, date of birth, surgical site, and procedure verified Lesion destroyed using liquid nitrogen: Yes   Region frozen until ice ball extended beyond lesion: Yes   Outcome: patient tolerated procedure well with no complications   Post-procedure details: wound care instructions given   Additional details:  Prior to procedure, discussed risks of blister formation, small wound, skin dyspigmentation, or rare scar following  cryotherapy. Recommend Vaseline ointment to treated areas while healing.  Seborrheic Keratoses - Stuck-on, waxy, tan-brown papules and/or plaques  - Benign-appearing - Discussed benign etiology and prognosis. - Observe - Call for any changes  Actinic Damage - chronic, secondary to cumulative UV radiation exposure/sun exposure over time - diffuse scaly erythematous  macules with underlying dyspigmentation - Recommend daily broad spectrum sunscreen SPF 30+ to sun-exposed areas, reapply every 2 hours as needed.  - Recommend staying in the shade or wearing long sleeves, sun glasses (UVA+UVB protection) and wide brim hats (4-inch brim around the entire circumference of the hat). - Call for new or changing lesions.  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  - Call if any new or changing lesions are noted between office visits  Return for keep follow up as scheduled 08/21/21.  IRuthell Rummage, CMA, am acting as scribe for Sarina Ser, MD. Documentation: I have reviewed the above documentation for accuracy and completeness, and I agree with the above.  Sarina Ser, MD

## 2021-06-04 ENCOUNTER — Encounter: Payer: Self-pay | Admitting: Dermatology

## 2021-06-12 ENCOUNTER — Inpatient Hospital Stay: Payer: Medicare Other | Attending: Oncology

## 2021-06-12 ENCOUNTER — Other Ambulatory Visit: Payer: Self-pay

## 2021-06-12 DIAGNOSIS — Z8572 Personal history of non-Hodgkin lymphomas: Secondary | ICD-10-CM | POA: Diagnosis present

## 2021-06-12 DIAGNOSIS — Z95828 Presence of other vascular implants and grafts: Secondary | ICD-10-CM

## 2021-06-12 DIAGNOSIS — Z452 Encounter for adjustment and management of vascular access device: Secondary | ICD-10-CM | POA: Diagnosis not present

## 2021-06-12 MED ORDER — HEPARIN SOD (PORK) LOCK FLUSH 100 UNIT/ML IV SOLN
500.0000 [IU] | Freq: Once | INTRAVENOUS | Status: AC
  Administered 2021-06-12: 14:00:00 500 [IU] via INTRAVENOUS
  Filled 2021-06-12: qty 5

## 2021-06-12 MED ORDER — SODIUM CHLORIDE 0.9% FLUSH
10.0000 mL | Freq: Once | INTRAVENOUS | Status: AC
  Administered 2021-06-12: 14:00:00 10 mL via INTRAVENOUS
  Filled 2021-06-12: qty 10

## 2021-08-07 ENCOUNTER — Inpatient Hospital Stay: Payer: Medicare Other | Attending: Oncology

## 2021-08-07 DIAGNOSIS — Z452 Encounter for adjustment and management of vascular access device: Secondary | ICD-10-CM | POA: Insufficient documentation

## 2021-08-07 DIAGNOSIS — Z95828 Presence of other vascular implants and grafts: Secondary | ICD-10-CM

## 2021-08-07 DIAGNOSIS — Z8572 Personal history of non-Hodgkin lymphomas: Secondary | ICD-10-CM | POA: Diagnosis present

## 2021-08-07 MED ORDER — HEPARIN SOD (PORK) LOCK FLUSH 100 UNIT/ML IV SOLN
500.0000 [IU] | Freq: Once | INTRAVENOUS | Status: AC
  Administered 2021-08-07: 500 [IU] via INTRAVENOUS
  Filled 2021-08-07: qty 5

## 2021-08-07 MED ORDER — SODIUM CHLORIDE 0.9% FLUSH
10.0000 mL | Freq: Once | INTRAVENOUS | Status: AC
  Administered 2021-08-07: 10 mL via INTRAVENOUS
  Filled 2021-08-07: qty 10

## 2021-08-21 ENCOUNTER — Ambulatory Visit (INDEPENDENT_AMBULATORY_CARE_PROVIDER_SITE_OTHER): Payer: Medicare Other | Admitting: Dermatology

## 2021-08-21 DIAGNOSIS — Z1283 Encounter for screening for malignant neoplasm of skin: Secondary | ICD-10-CM | POA: Diagnosis not present

## 2021-08-21 DIAGNOSIS — L821 Other seborrheic keratosis: Secondary | ICD-10-CM

## 2021-08-21 DIAGNOSIS — L570 Actinic keratosis: Secondary | ICD-10-CM

## 2021-08-21 DIAGNOSIS — D692 Other nonthrombocytopenic purpura: Secondary | ICD-10-CM | POA: Diagnosis not present

## 2021-08-21 DIAGNOSIS — L578 Other skin changes due to chronic exposure to nonionizing radiation: Secondary | ICD-10-CM | POA: Diagnosis not present

## 2021-08-21 DIAGNOSIS — Z85828 Personal history of other malignant neoplasm of skin: Secondary | ICD-10-CM

## 2021-08-21 DIAGNOSIS — L820 Inflamed seborrheic keratosis: Secondary | ICD-10-CM | POA: Diagnosis not present

## 2021-08-21 DIAGNOSIS — L814 Other melanin hyperpigmentation: Secondary | ICD-10-CM

## 2021-08-21 DIAGNOSIS — D229 Melanocytic nevi, unspecified: Secondary | ICD-10-CM

## 2021-08-21 DIAGNOSIS — D1800 Hemangioma unspecified site: Secondary | ICD-10-CM

## 2021-08-21 NOTE — Progress Notes (Signed)
Follow-Up Visit   Subjective  Sean Raymond is a 76 y.o. male who presents for the following: Total body skin exam (Hx of SCC L hand, base of hand, hx of AKs). The patient presents for Total-Body Skin Exam (TBSE) for skin cancer screening and mole check.  The patient has spots, moles and lesions to be evaluated, some may be new or changing and the patient has concerns that these could be cancer.   The following portions of the chart were reviewed this encounter and updated as appropriate:   Tobacco  Allergies  Meds  Problems  Med Hx  Surg Hx  Fam Hx     Review of Systems:  No other skin or systemic complaints except as noted in HPI or Assessment and Plan.  Objective  Well appearing patient in no apparent distress; mood and affect are within normal limits.  A full examination was performed including scalp, head, eyes, ears, nose, lips, neck, chest, axillae, abdomen, back, buttocks, bilateral upper extremities, bilateral lower extremities, hands, feet, fingers, toes, fingernails, and toenails. All findings within normal limits unless otherwise noted below.  back x 16, neck x 1, R frontal scalp/hairline x 1 (18) Stuck on waxy paps with erythema  face, R ear x 5 (5) Pink scaly macules      Assessment & Plan   Lentigines - Scattered tan macules - Due to sun exposure - Benign-appearing, observe - Recommend daily broad spectrum sunscreen SPF 30+ to sun-exposed areas, reapply every 2 hours as needed. - Call for any changes  Seborrheic Keratoses - Stuck-on, waxy, tan-brown papules and/or plaques  - Benign-appearing - Discussed benign etiology and prognosis. - Observe - Call for any changes  Melanocytic Nevi - Tan-brown and/or pink-flesh-colored symmetric macules and papules - Benign appearing on exam today - Observation - Call clinic for new or changing moles - Recommend daily use of broad spectrum spf 30+ sunscreen to sun-exposed areas.   Hemangiomas - Red  papules - Discussed benign nature - Observe - Call for any changes  Actinic Damage - Chronic condition, secondary to cumulative UV/sun exposure - diffuse scaly erythematous macules with underlying dyspigmentation - Recommend daily broad spectrum sunscreen SPF 30+ to sun-exposed areas, reapply every 2 hours as needed.  - Staying in the shade or wearing long sleeves, sun glasses (UVA+UVB protection) and wide brim hats (4-inch brim around the entire circumference of the hat) are also recommended for sun protection.  - Call for new or changing lesions.  Skin cancer screening performed today.  History of Squamous Cell Carcinoma of the Skin - No evidence of recurrence today - No lymphadenopathy - Recommend regular full body skin exams - Recommend daily broad spectrum sunscreen SPF 30+ to sun-exposed areas, reapply every 2 hours as needed.  - Call if any new or changing lesions are noted between office visits - L hand, base of hand   Purpura - Chronic; persistent and recurrent.  Treatable, but not curable. - Violaceous macules and patches - Benign - Related to trauma, age, sun damage and/or use of blood thinners, chronic use of topical and/or oral steroids - Observe - Can use OTC arnica containing moisturizer such as Dermend Bruise Formula if desired - Call for worsening or other concerns   Inflamed seborrheic keratosis (18) back x 16, neck x 1, R frontal scalp/hairline x 1  Destruction of lesion - back x 16, neck x 1, R frontal scalp/hairline x 1 Complexity: simple   Destruction method: cryotherapy   Informed  consent: discussed and consent obtained   Timeout:  patient name, date of birth, surgical site, and procedure verified Lesion destroyed using liquid nitrogen: Yes   Region frozen until ice ball extended beyond lesion: Yes   Outcome: patient tolerated procedure well with no complications   Post-procedure details: wound care instructions given    AK (actinic keratosis)  (5) face, R ear x 5  Recheck Ak above L brow, see photo,  if not clear in 2 months pt will return to clinic for bx  Destruction of lesion - face, R ear x 5 Complexity: simple   Destruction method: cryotherapy   Informed consent: discussed and consent obtained   Timeout:  patient name, date of birth, surgical site, and procedure verified Lesion destroyed using liquid nitrogen: Yes   Region frozen until ice ball extended beyond lesion: Yes   Outcome: patient tolerated procedure well with no complications   Post-procedure details: wound care instructions given     Return in about 6 months (around 02/21/2022) for AK/ISK f/u, pt to call to rtc if AK above L brow not resolved in 2 months.  I, Othelia Pulling, RMA, am acting as scribe for Sarina Ser, MD . Documentation: I have reviewed the above documentation for accuracy and completeness, and I agree with the above.  Sarina Ser, MD

## 2021-08-21 NOTE — Patient Instructions (Addendum)
Cryotherapy Aftercare  Wash gently with soap and water everyday.   Apply Vaseline and Band-Aid daily until healed.   If the AK above the left brow does not resolve in 2 months, call the clinic and we will get you in for a biopsy.   If You Need Anything After Your Visit  If you have any questions or concerns for your doctor, please call our main line at [phone] and press option 4 to reach your doctor's medical assistant. If no one answers, please leave a voicemail as directed and we will return your call as soon as possible. Messages left after 4 pm will be answered the following business day.   You may also send [place] a message via [place]. We typically respond to MyChart messages within 1-2 business days.  For prescription refills, please ask your pharmacy to contact our office. Our fax number is [phone].  If you have an urgent issue when the clinic is closed that cannot wait until the next business day, you can page your doctor at the number below.    Please note that while we do our best to be available for urgent issues outside of office hours, we are not available 24/7.   If you have an urgent issue and are unable to reach [place], you may choose to seek medical care at your doctor's office, retail clinic, urgent care center, or emergency room.  If you have a medical emergency, please immediately call 911 or go to the emergency department.  Pager Numbers  - Dr. Nehemiah Massed: [phone]  - Dr. Laurence Ferrari: [phone]  - Dr. Nicole Kindred: [phone]  In the event of inclement weather, please call our main line at [phone] for an update on the status of any delays or closures.  Dermatology Medication Tips: Please keep the boxes that topical medications come in in order to help keep track of the instructions about where and how to use these. Pharmacies typically print the medication instructions only on the boxes and not directly on the medication tubes.   If your medication is too  expensive, please contact our office at [phone] option 4 or send [place] a message through [place].   We are unable to tell what your co-pay for medications will be in advance as this is different depending on your insurance coverage. However, we may be able to find a substitute medication at lower cost or fill out paperwork to get insurance to cover a needed medication.   If a prior authorization is required to get your medication covered by your insurance company, please allow [place] 1-2 business days to complete this process.  Drug prices often vary depending on where the prescription is filled and some pharmacies may offer cheaper prices.  The website www.goodrx.com contains coupons for medications through different pharmacies. The prices here do not account for what the cost may be with help from insurance (it may be cheaper with your insurance), but the website can give you the price if you did not use any insurance.  - You can print the associated coupon and take it with your prescription to the pharmacy.  - You may also stop by our office during regular business hours and pick up a GoodRx coupon card.  - If you need your prescription sent electronically to a different pharmacy, notify our office through [facility] or by phone at [phone] option 4.     Si Usted Necesita Algo Despus de Su Visita  Tambin puede enviarnos un mensaje a [place] Cruel  de MyChart. Por lo general respondemos a los mensajes de MyChart en el transcurso de 1 a 2 das hbiles.  Para renovar recetas, por favor pida a su farmacia que se ponga en contacto con nuestra oficina. Harland Dingwall de fax es [place] [phone].  Si tiene un asunto urgente cuando la clnica est cerrada y que no puede esperar hasta el siguiente da hbil, puede llamar/localizar a su doctor(a) al nmero que aparece a continuacin.   Por favor, tenga en cuenta que aunque hacemos todo lo posible para estar disponibles para asuntos urgentes fuera  del horario de [place], no estamos disponibles las 24 horas del da, los 7 das de la [place].   Si tiene un problema urgente y no puede comunicarse con nosotros, puede optar por buscar atencin mdica  en el consultorio de su doctor(a), en una clnica privada, en un centro de atencin urgente o en una sala de emergencias.  Si tiene Engineering geologist, por favor llame inmediatamente al 911 o vaya a la sala de emergencias.  Nmeros de bper  - Dr. Nehemiah Massed: [phone]  - Dra. Moye: [phone]  - Dra. Nicole Kindred: [phone]  En caso de inclemencias del [place], por favor llame a Johnsie Kindred principal al [phone] para una actualizacin sobre el [place] de cualquier retraso o cierre.  Consejos para la medicacin en dermatologa: Por favor, guarde las cajas en las que vienen los medicamentos de uso tpico para ayudarle a seguir las instrucciones sobre dnde y cmo usarlos. Las farmacias generalmente imprimen las instrucciones del medicamento slo en las cajas y no directamente en los tubos del [place].   Si su medicamento es muy caro, por favor, pngase en contacto con Zigmund Daniel llamando al [phone] y presione la opcin 4 o envenos un mensaje a travs de Pharmacist, community.   No podemos decirle cul ser su copago por los medicamentos por adelantado ya que esto es diferente dependiendo de la cobertura de su seguro. Sin embargo, es posible que podamos encontrar un medicamento sustituto a Electrical engineer un formulario para que el seguro cubra el medicamento que se considera necesario.   Si se requiere una autorizacin previa para que su compaa de seguros [place] su medicamento, por favor permtanos de 1 a 2 das hbiles para completar este proceso.  Los precios de los medicamentos varan con frecuencia dependiendo del Environmental consultant de dnde se surte la receta y alguna farmacias pueden ofrecer precios ms baratos.  El sitio web www.goodrx.com tiene cupones para medicamentos de Office manager. Los precios aqu no tienen en cuenta lo que podra costar con la ayuda del seguro (puede ser ms barato con su seguro), pero el sitio web puede darle el precio si no utiliz Research scientist (physical sciences).  - Puede imprimir el cupn correspondiente y llevarlo con su receta a la farmacia.  - Tambin puede pasar por nuestra oficina durante el horario de atencin regular y Charity fundraiser una tarjeta de cupones de GoodRx.  - Si necesita que su receta se enve electrnicamente a una farmacia diferente, informe a nuestra oficina a travs de MyChart de [place] o por telfono llamando al [phone] y presione la opcin 4.

## 2021-08-31 ENCOUNTER — Encounter: Payer: Self-pay | Admitting: Dermatology

## 2021-10-02 ENCOUNTER — Inpatient Hospital Stay: Payer: Medicare Other | Attending: Oncology

## 2021-10-02 DIAGNOSIS — Z95828 Presence of other vascular implants and grafts: Secondary | ICD-10-CM

## 2021-10-02 DIAGNOSIS — Z452 Encounter for adjustment and management of vascular access device: Secondary | ICD-10-CM | POA: Insufficient documentation

## 2021-10-02 DIAGNOSIS — Z8572 Personal history of non-Hodgkin lymphomas: Secondary | ICD-10-CM | POA: Diagnosis present

## 2021-10-02 MED ORDER — HEPARIN SOD (PORK) LOCK FLUSH 100 UNIT/ML IV SOLN
500.0000 [IU] | Freq: Once | INTRAVENOUS | Status: AC
  Administered 2021-10-02: 500 [IU] via INTRAVENOUS
  Filled 2021-10-02: qty 5

## 2021-10-02 MED ORDER — SODIUM CHLORIDE 0.9% FLUSH
10.0000 mL | Freq: Once | INTRAVENOUS | Status: AC
  Administered 2021-10-02: 10 mL via INTRAVENOUS
  Filled 2021-10-02: qty 10

## 2021-11-25 ENCOUNTER — Inpatient Hospital Stay: Payer: Medicare Other | Attending: Oncology

## 2021-11-25 VITALS — BP 132/70 | HR 85 | Temp 97.2°F | Resp 18

## 2021-11-25 DIAGNOSIS — Z95828 Presence of other vascular implants and grafts: Secondary | ICD-10-CM

## 2021-11-25 DIAGNOSIS — Z452 Encounter for adjustment and management of vascular access device: Secondary | ICD-10-CM | POA: Diagnosis present

## 2021-11-25 DIAGNOSIS — Z8572 Personal history of non-Hodgkin lymphomas: Secondary | ICD-10-CM | POA: Diagnosis present

## 2021-11-25 MED ORDER — HEPARIN SOD (PORK) LOCK FLUSH 100 UNIT/ML IV SOLN
500.0000 [IU] | Freq: Once | INTRAVENOUS | Status: AC
  Administered 2021-11-25: 500 [IU] via INTRAVENOUS
  Filled 2021-11-25: qty 5

## 2021-11-25 MED ORDER — SODIUM CHLORIDE 0.9% FLUSH
10.0000 mL | Freq: Once | INTRAVENOUS | Status: AC
  Administered 2021-11-25: 10 mL via INTRAVENOUS
  Filled 2021-11-25: qty 10

## 2021-11-25 NOTE — Patient Instructions (Signed)
[facility] AT [place]  Discharge Instructions: Thank you for choosing [facility] to provide your oncology and hematology care.  If you have a lab appointment with the [facility], please go directly to the [facility] and check in at the registration area.  Wear comfortable clothing and clothing appropriate for easy access to any Portacath or PICC line.   We strive to give you quality time with your provider. You may need to reschedule your appointment if you arrive late (15 or more minutes).  Arriving late affects you and other patients whose appointments are after yours.  Also, if you miss three or more appointments without notifying the office, you may be dismissed from the clinic at the provider's discretion.      For prescription refill requests, have your pharmacy contact our office and allow 72 hours for refills to be completed.    Today you received the following chemotherapy and/or immunotherapy agents PORT FLUSH      To help prevent nausea and vomiting after your treatment, we encourage you to take your nausea medication as directed.  BELOW ARE SYMPTOMS THAT SHOULD BE REPORTED IMMEDIATELY: *FEVER GREATER THAN 100.4 F (38 C) OR HIGHER *CHILLS OR SWEATING *NAUSEA AND VOMITING THAT IS NOT CONTROLLED WITH YOUR NAUSEA MEDICATION *UNUSUAL SHORTNESS OF BREATH *UNUSUAL BRUISING OR BLEEDING *URINARY PROBLEMS (pain or burning when urinating, or frequent urination) *BOWEL PROBLEMS (unusual diarrhea, constipation, pain near the anus) TENDERNESS IN MOUTH AND THROAT WITH OR WITHOUT PRESENCE OF ULCERS (sore throat, sores in mouth, or a toothache) UNUSUAL RASH, SWELLING OR PAIN  UNUSUAL VAGINAL DISCHARGE OR ITCHING   Items with * indicate a potential emergency and should be followed up as soon as possible or go to the Emergency Department if any problems should occur.  Please show the CHEMOTHERAPY ALERT CARD or IMMUNOTHERAPY ALERT CARD at check-in to  the Emergency Department and triage nurse.  Should you have questions after your visit or need to cancel or reschedule your appointment, please contact [facility] [place] AT [place]  [phone] and follow the prompts.  Office hours are 8:00 a.m. to 4:30 p.m. Monday - Friday. Please note that voicemails left after 4:00 p.m. may not be returned until the following business day.  We are closed weekends and major holidays. You have access to a nurse at all times for urgent questions. Please call the main number to the clinic [phone] and follow the prompts.  For any non-urgent questions, you may also contact your provider using MyChart. We now offer e-Visits for anyone 71 and older to request care online for non-urgent symptoms. For details visit [email or web address].   Also download the MyChart app! Go to the app store, search "MyChart", open the app, select [facility], and log in with your MyChart username and password.  Masks are optional in the cancer centers. If you would like for your care team to wear a mask while they are taking care of you, please let them know. For doctor visits, patients may have with them one support person who is at least 74 years old. At this time, visitors are not allowed in the infusion area.

## 2021-11-27 ENCOUNTER — Inpatient Hospital Stay: Payer: Medicare Other

## 2021-12-24 ENCOUNTER — Other Ambulatory Visit: Payer: Self-pay

## 2021-12-24 ENCOUNTER — Inpatient Hospital Stay: Payer: Medicare Other | Attending: Oncology

## 2021-12-24 ENCOUNTER — Ambulatory Visit
Admission: RE | Admit: 2021-12-24 | Discharge: 2021-12-24 | Disposition: A | Discharge status: Home / Self Care | Payer: Medicare Other | Source: Ambulatory Visit | Attending: Oncology | Admitting: Oncology

## 2021-12-24 ENCOUNTER — Other Ambulatory Visit: Payer: Medicare Other

## 2021-12-24 DIAGNOSIS — Z801 Family history of malignant neoplasm of trachea, bronchus and lung: Secondary | ICD-10-CM | POA: Diagnosis not present

## 2021-12-24 DIAGNOSIS — Z800 Family history of malignant neoplasm of digestive organs: Secondary | ICD-10-CM | POA: Diagnosis not present

## 2021-12-24 DIAGNOSIS — C8211 Follicular lymphoma grade II, lymph nodes of head, face, and neck: Secondary | ICD-10-CM | POA: Insufficient documentation

## 2021-12-24 DIAGNOSIS — Z8572 Personal history of non-Hodgkin lymphomas: Secondary | ICD-10-CM | POA: Diagnosis present

## 2021-12-24 DIAGNOSIS — Z95828 Presence of other vascular implants and grafts: Secondary | ICD-10-CM

## 2021-12-24 LAB — COMPREHENSIVE METABOLIC PANEL
ALT: 16 U/L (ref 0–44)
AST: 25 U/L (ref 15–41)
Albumin: 4 g/dL (ref 3.5–5.0)
Alkaline Phosphatase: 64 U/L (ref 38–126)
Anion gap: 6 (ref 5–15)
BUN: 14 mg/dL (ref 8–23)
CO2: 25 mmol/L (ref 22–32)
Calcium: 8.5 mg/dL — ABNORMAL LOW (ref 8.9–10.3)
Chloride: 106 mmol/L (ref 98–111)
Creatinine, Ser: 0.94 mg/dL (ref 0.61–1.24)
GFR, Estimated: >60 mL/min (ref >60)
Glucose, Bld: 102 mg/dL — ABNORMAL HIGH (ref 70–99)
Potassium: 3.8 mmol/L (ref 3.5–5.1)
Sodium: 137 mmol/L (ref 135–145)
Total Bilirubin: 1.1 mg/dL (ref 0.3–1.2)
Total Protein: 6.4 g/dL — ABNORMAL LOW (ref 6.5–8.1)

## 2021-12-24 LAB — CBC WITH DIFFERENTIAL/PLATELET
Abs Immature Granulocytes: 0.03 10*3/uL (ref 0.00–0.07)
Basophils Absolute: 0.1 10*3/uL (ref 0.0–0.1)
Basophils Relative: 1 %
Eosinophils Absolute: 0.1 10*3/uL (ref 0.0–0.5)
Eosinophils Relative: 2 %
HCT: 41.4 % (ref 39.0–52.0)
Hemoglobin: 14 g/dL (ref 13.0–17.0)
Immature Granulocytes: 1 %
Lymphocytes Relative: 25 %
Lymphs Abs: 1.4 10*3/uL (ref 0.7–4.0)
MCH: 30.1 pg (ref 26.0–34.0)
MCHC: 33.8 g/dL (ref 30.0–36.0)
MCV: 89 fL (ref 80.0–100.0)
Monocytes Absolute: 0.7 10*3/uL (ref 0.1–1.0)
Monocytes Relative: 13 %
Neutro Abs: 3.3 10*3/uL (ref 1.7–7.7)
Neutrophils Relative %: 58 %
Platelets: 135 10*3/uL — ABNORMAL LOW (ref 150–400)
RBC: 4.65 MIL/uL (ref 4.22–5.81)
RDW: 13 % (ref 11.5–15.5)
WBC: 5.6 10*3/uL (ref 4.0–10.5)
nRBC: 0 % (ref 0.0–0.2)

## 2021-12-24 MED ORDER — IOHEXOL 300 MG/ML  SOLN
100.0000 mL | Freq: Once | INTRAMUSCULAR | Status: AC | PRN
  Administered 2021-12-24: 100 mL via INTRAVENOUS

## 2021-12-24 MED ORDER — HEPARIN SOD (PORK) LOCK FLUSH 100 UNIT/ML IV SOLN
500.0000 [IU] | Freq: Once | INTRAVENOUS | Status: DC
  Filled 2021-12-24: qty 5

## 2021-12-24 MED ORDER — HEPARIN SOD (PORK) LOCK FLUSH 100 UNIT/ML IV SOLN
500.0000 [IU] | Freq: Once | INTRAVENOUS | Status: AC
  Administered 2021-12-24: 500 [IU] via INTRAVENOUS

## 2021-12-26 NOTE — Progress Notes (Signed)
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  He continues to feel well and remains asymptomatic. 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 He has no neurologic complaints.  He denies any fevers, weight loss, or night sweats.  He has noted no new lymphadenopathy.  He denies any chest pain, shortness of breath, cough, or hemoptysis.  He denies any nausea, vomiting, constipation, or diarrhea.  He has no urinary complaints.  Patient offers no specific complaints today.  REVIEW OF SYSTEMS:   Review of Systems  Constitutional: Negative.  Negative for diaphoresis, fever, malaise/fatigue and weight loss.  Eyes:  Positive for double vision. Negative for blurred vision and pain.  Respiratory: Negative.  Negative for cough and shortness of breath.   Cardiovascular: Negative.  Negative for chest pain and leg swelling.  Gastrointestinal: Negative.  Negative for abdominal pain.  Genitourinary: Negative.  Negative for dysuria.  Musculoskeletal: Negative.  Negative for neck pain.  Skin: Negative.  Negative for rash.  Neurological: Negative.  Negative for sensory change, focal weakness, weakness and headaches.  Psychiatric/Behavioral: Negative.  The patient is not nervous/anxious.     As per HPI. Otherwise, a complete review of systems is negative.  PAST MEDICAL HISTORY: Past Medical History:  Diagnosis Date   Follicular lymphoma ([place]) 2009   Patient had chemo + rad tx's and zevaline shots./ no flare ups since 2015   GERD (gastroesophageal reflux disease)    Heart murmur    past finding  by Dr Doy Hutching   Hemorrhoids    Hypercholesterolemia    Hypertension    started as a preventative   Seasonal allergies    Sleep apnea    CPAP   Squamous cell carcinoma of skin 02/10/2021   Left hand base of hand - [facility]   Transfusion history    Vertigo 5 yrs ago   positional vertigo    PAST SURGICAL HISTORY: Hemorrhoidectomy.  FAMILY HISTORY: Colon cancer and lung cancer.     ADVANCED DIRECTIVES:    HEALTH MAINTENANCE: Social History   Tobacco Use   Smoking status: Never   Smokeless tobacco: Never  Substance Use Topics   Alcohol use: Yes    Alcohol/week: 2.0 standard drinks of alcohol    Types: 2 Cans of beer per week    Comment: occasional   Drug use: No     Allergies  Allergen Reactions   Tape Rash    bandaids    Current Outpatient Medications  Medication Sig Dispense Refill   alfuzosin (UROXATRAL) 10 MG 24 hr tablet Take 10 mg by mouth daily.     amLODipine (NORVASC) 5 MG tablet Take 5 mg by mouth daily.      aspirin EC 81 MG tablet Take 81 mg by mouth daily.      atorvastatin (LIPITOR) 10 MG tablet Take 10 mg by mouth daily at 6 PM.      Calcium Carbonate-Vitamin D 600-400 MG-UNIT per tablet Take 1 tablet by mouth daily.      cetirizine (ZYRTEC) 10 MG tablet Take 10 mg by mouth daily.     Multiple Vitamin (MULTI-VITAMINS) TABS Take 1 tablet  by mouth daily.      omeprazole (PRILOSEC) 40 MG capsule Take 40 mg by mouth daily.      tadalafil (CIALIS) 20 MG tablet Take by mouth daily as needed.      MYRBETRIQ 50 MG TB24 tablet Take 50 mg by mouth daily. (Patient not taking: Reported on 12/31/2021)     tamsulosin (FLOMAX) 0.4 MG CAPS capsule Take 1 capsule by mouth daily.  (Patient not taking: Reported on 12/26/2020)     No current facility-administered medications for this visit.    OBJECTIVE: Vitals:   12/31/21 1123  BP: 119/69  Pulse: 72  Temp: (!) 96.9 F (36.1 C)     Body mass index is 31.77 kg/m.    ECOG FS:0 - Asymptomatic  General:  Well-developed, well-nourished, no acute distress. Eyes: Pink conjunctiva, anicteric sclera. HEENT: Normocephalic, moist mucous membranes. Lungs: No audible wheezing or coughing. Heart: Regular rate and rhythm. Abdomen: Soft, nontender, no obvious distention. Musculoskeletal: No edema, cyanosis, or clubbing. Neuro: Alert, answering all questions appropriately. Cranial nerves grossly intact. Skin: No rashes or petechiae noted. Psych: Normal affect. Lymphatics: No palpable lymphadenopathy.   LAB RESULTS:  Lab Results  Component Value Date   NA 137 12/24/2021   K 3.8 12/24/2021   CL 106 12/24/2021   CO2 25 12/24/2021   GLUCOSE 102 (H) 12/24/2021   BUN 14 12/24/2021   CREATININE 0.94 12/24/2021   CALCIUM 8.5 (L) 12/24/2021   PROT 6.4 (L) 12/24/2021   ALBUMIN 4.0 12/24/2021   AST 25 12/24/2021   ALT 16 12/24/2021   ALKPHOS 64 12/24/2021   BILITOT 1.1 12/24/2021   GFRNONAA >60 12/24/2021   GFRAA >60 12/21/2019    Lab Results  Component Value Date   WBC 5.6 12/24/2021   NEUTROABS 3.3 12/24/2021   HGB 14.0 12/24/2021   HCT 41.4 12/24/2021   MCV 89.0 12/24/2021   PLT 135 (L) 12/24/2021     STUDIES: CT MAXILLOFACIAL W CONTRAST  Result Date: 12/26/2021 CLINICAL DATA:  Follow-up lymphoma EXAM: CT MAXILLOFACIAL WITH CONTRAST TECHNIQUE: Multidetector CT imaging of the maxillofacial structures was performed with intravenous contrast. Multiplanar CT image reconstructions were also generated. RADIATION DOSE REDUCTION: This exam was performed according to the departmental dose-optimization program which includes automated exposure control, adjustment of the mA and/or kV according to patient size and/or use of iterative reconstruction technique. CONTRAST:  179m OMNIPAQUE IOHEXOL 300 MG/ML  SOLN COMPARISON:  12/25/2020 FINDINGS: Osseous: Negative for fracture or destructive process. Orbits: There is subtly increased soft tissue along the lower aspect of the left lobe without proptosis  or discrete measurable mass. Lacrimal gland thinning symmetrically. Sinuses: Negative Soft tissues: Heterogeneity of parotid glands attributed to small diffuse lymph nodes and scarring. Symmetric bilateral capsular thickening. No progressive finding. Submandibular lymph nodes remain conspicuous without progression. Limited intracranial: Unchanged bulbous left cavernous sinus region with tortuous ICA siphon. This appearance is stable over multiple years. IMPRESSION: Subtly increased soft tissue density below the left globe without discrete mass, please correlate for left ocular symptoms and consider closer follow-up scan. Electronically Signed   By: JJorje GuildM.D.   On: 12/26/2021 06:17   CT SOFT TISSUE NECK W CONTRAST  Result Date: 12/26/2021 CLINICAL DATA:  Lymphoma follow-up EXAM: CT NECK WITH CONTRAST TECHNIQUE: Multidetector CT imaging of the neck was performed using the standard protocol following the bolus administration of intravenous contrast. RADIATION DOSE REDUCTION: This exam was performed according to the departmental dose-optimization program which includes automated exposure control, adjustment of  the mA and/or kV according to patient size and/or use of iterative reconstruction technique. CONTRAST:  166m OMNIPAQUE IOHEXOL 300 MG/ML  SOLN COMPARISON:  One year prior FINDINGS: Pharynx and larynx: No thickening of Waldeyer's ring Salivary glands: Diffuse heterogeneity of the parotid glands, likely related to scarring from prior adenopathy. Symmetric bilateral submandibular gland atrophy. Thyroid: Normal Lymph nodes: Continued enlargement of lymph nodes in the neck with adjacent fat reticulation attributed to scarring. Index nodes: *Right jugulodigastric continues to measure 10-11 mm in transverse span, 28:44 *Wispy lesion in the right lower posterior triangle inferiorly measures 2 cm in length by 12 mm in thickness. 28:58 *Two left lower posterior triangle lymph nodes measure up to 14 mm short  axis, unchanged. 28:62 *A left supraclavicular node measures 27 x 16 mm unchanged when remeasured in a similar fashion. 28:71 Vascular: Porta catheter on the right. Limited intracranial: Negative Visualized orbits: Negative for mass.  Bilateral cataract resection Mastoids and visualized paranasal sinuses: Clear Skeleton: Ordinary cervical spine degeneration. Upper chest: Reported separately IMPRESSION: 1. Stable generalized cervical adenopathy when compared to CT 1 year prior. 2. Chest CT reported separately. Electronically Signed   By: JJorje GuildM.D.   On: 12/26/2021 05:37   CT CHEST ABDOMEN PELVIS W CONTRAST  Result Date: 12/25/2021 CLINICAL DATA:  For locule ower lymphoma of the head and face. Restaging. * Tracking Code: BO * EXAM: CT CHEST, ABDOMEN, AND PELVIS WITH CONTRAST TECHNIQUE: Multidetector CT imaging of the chest, abdomen and pelvis was performed following the standard protocol during bolus administration of intravenous contrast. RADIATION DOSE REDUCTION: This exam was performed according to the departmental dose-optimization program which includes automated exposure control, adjustment of the mA and/or kV according to patient size and/or use of iterative reconstruction technique. CONTRAST:  1020mOMNIPAQUE IOHEXOL 300 MG/ML  SOLN COMPARISON:  12/25/2020 FINDINGS: CT CHEST FINDINGS Cardiovascular: The heart size is normal. No substantial pericardial effusion. Right Port-A-Cath tip is positioned in the upper right atrium. Mild atherosclerotic calcification is noted in the wall of the thoracic aorta. Mediastinum/Nodes: Axillary, subpectoral, and thoracic inlet lymphadenopathy again noted. Index right axillary node measured previously at 11 mm is stable at 11 mm today. Index left axillary node measured previously at 11 mm is 13 mm today on 21/5. No mediastinal lymphadenopathy. There is no hilar lymphadenopathy. The esophagus has normal imaging features. Lungs/Pleura: Chronic atelectasis/scarring  in the left lower lobe is similar to prior. No suspicious pulmonary nodule or mass. No focal airspace consolidation. Musculoskeletal: No worrisome lytic or sclerotic osseous abnormality. CT ABDOMEN PELVIS FINDINGS Hepatobiliary: Small hypodensity posterior right liver is stable consistent with benign etiology. Tiny calcified gallstone evident. No intrahepatic or extrahepatic biliary dilation. Pancreas: No focal mass lesion. No dilatation of the main duct. No intraparenchymal cyst. No peripancreatic edema. Spleen: Small hypodensities in the spleen are stable. Adrenals/Urinary Tract: No change bilateral adrenal nodules. These have been previously reported as stable back to 2013. Stable small right renal cyst. Tiny hypodensities left kidney are stable, too small to characterize but most likely benign. No evidence for hydroureter. Mild bladder wall thickening/irregularity evident. Stomach/Bowel: Stomach is unremarkable. No gastric wall thickening. No evidence of outlet obstruction. Duodenum is normally positioned as is the ligament of Treitz. No small bowel wall thickening. No small bowel dilatation. The terminal ileum is normal. The appendix is normal. No gross colonic mass. No colonic wall thickening. Diverticular changes are noted in the left colon without evidence of diverticulitis. Vascular/Lymphatic: There is mild atherosclerotic calcification of the abdominal  aorta without aneurysm. 11 mm short axis gastrohepatic ligament lymph node is 10 mm previously. Left para-aortic node adjacent to the SMA is 14 mm short axis today on 60/5 compared to 10 mm previously. 20 mm short axis central mesenteric node on 79/5 was measured previously at 14 mm. 13 mm left external iliac node on 01/13/5 was measured previously 9 mm. Additional scattered lymph nodes in the abdomen and pelvis shows similar mild interval progression. Reproductive: Prostate gland is enlarged. Other: No intraperitoneal free fluid. Musculoskeletal: Umbilical  hernia contains fat and a tiny amount of, similar to prior. No worrisome lytic or sclerotic osseous abnormality. IMPRESSION: 1. Overall generalized trend of mildly progressive lymphadenopathy in the chest, abdomen, and pelvis. 2. Stable bilateral adrenal nodules, previously reported as stable back to 2013 and compatible with lipid poor adenomas. 3. Cholelithiasis. 4. Left colonic diverticulosis without diverticulitis. 5. Prostatomegaly. 6. Aortic Atherosclerosis (ICD10-I70.0). Electronically Signed   By: Misty Stanley M.D.   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  CT scan results from December 25, 2021 reviewed independently and reported as above with continued mild increase of lymphadenopathy.  Patient continues to remain asymptomatic. Continue active surveillance.  No intervention is needed at this time.  New yearly imaging and evaluation at least through 2024 at which point patient will be 10 years removed from his treatments.  Could possibly extend monitoring if lymph nodes continue to increase in size slowly.  Return to clinic in 1 year with repeat imaging and further evaluation.   2.  Port: Patient continues to maintain his port and does not wish to take it out at this time.  Continue port flushes every 6 weeks.  I spent a total of 20 minutes reviewing chart data, face-to-face evaluation with the patient, counseling and coordination of care as detailed above.    Patient expressed understanding and was in agreement with this plan. He also understands that He can call clinic at any time with any questions, concerns, or complaints.    Lloyd Huger, MD   01/02/2022 9:47 AM

## 2021-12-30 ENCOUNTER — Ambulatory Visit: Payer: Medicare Other | Admitting: Nurse Practitioner

## 2021-12-30 ENCOUNTER — Ambulatory Visit: Payer: Medicare Other | Admitting: Oncology

## 2021-12-31 ENCOUNTER — Inpatient Hospital Stay (HOSPITAL_BASED_OUTPATIENT_CLINIC_OR_DEPARTMENT_OTHER): Payer: Medicare Other | Admitting: Oncology

## 2021-12-31 ENCOUNTER — Encounter: Payer: Self-pay | Admitting: Oncology

## 2021-12-31 VITALS — BP 119/69 | HR 72 | Temp 96.9°F | Wt 212.0 lb

## 2021-12-31 DIAGNOSIS — Z8572 Personal history of non-Hodgkin lymphomas: Secondary | ICD-10-CM | POA: Diagnosis not present

## 2021-12-31 DIAGNOSIS — C8211 Follicular lymphoma grade II, lymph nodes of head, face, and neck: Secondary | ICD-10-CM

## 2022-02-13 ENCOUNTER — Inpatient Hospital Stay: Payer: Medicare Other | Attending: Oncology

## 2022-02-13 DIAGNOSIS — Z8572 Personal history of non-Hodgkin lymphomas: Secondary | ICD-10-CM | POA: Insufficient documentation

## 2022-02-13 DIAGNOSIS — Z452 Encounter for adjustment and management of vascular access device: Secondary | ICD-10-CM | POA: Diagnosis not present

## 2022-02-13 DIAGNOSIS — Z95828 Presence of other vascular implants and grafts: Secondary | ICD-10-CM

## 2022-02-13 MED ORDER — HEPARIN SOD (PORK) LOCK FLUSH 100 UNIT/ML IV SOLN
500.0000 [IU] | Freq: Once | INTRAVENOUS | Status: AC
  Administered 2022-02-13: 500 [IU] via INTRAVENOUS
  Filled 2022-02-13: qty 5

## 2022-02-13 MED ORDER — SODIUM CHLORIDE 0.9% FLUSH
10.0000 mL | Freq: Once | INTRAVENOUS | Status: AC
  Administered 2022-02-13: 10 mL via INTRAVENOUS
  Filled 2022-02-13: qty 10

## 2022-03-04 ENCOUNTER — Ambulatory Visit (INDEPENDENT_AMBULATORY_CARE_PROVIDER_SITE_OTHER): Payer: Medicare Other | Admitting: Dermatology

## 2022-03-04 VITALS — BP 105/62 | HR 90

## 2022-03-04 DIAGNOSIS — Z5111 Encounter for antineoplastic chemotherapy: Secondary | ICD-10-CM | POA: Diagnosis not present

## 2022-03-04 DIAGNOSIS — L570 Actinic keratosis: Secondary | ICD-10-CM | POA: Diagnosis not present

## 2022-03-04 DIAGNOSIS — Z79899 Other long term (current) drug therapy: Secondary | ICD-10-CM | POA: Diagnosis not present

## 2022-03-04 DIAGNOSIS — L820 Inflamed seborrheic keratosis: Secondary | ICD-10-CM | POA: Diagnosis not present

## 2022-03-04 DIAGNOSIS — L578 Other skin changes due to chronic exposure to nonionizing radiation: Secondary | ICD-10-CM | POA: Diagnosis not present

## 2022-03-04 MED ORDER — FLUOROURACIL 5 % EX CREA: TOPICAL_CREAM | Freq: Two times a day (BID) | CUTANEOUS | 1 refills | Status: DC

## 2022-03-04 NOTE — Patient Instructions (Addendum)
In January 2024  Start 5-fluorouracil/calcipotriene cream twice a day for 7 days to affected areas including forehead and temples. Prescription sent to [facility]. Patient advised they will receive an email to purchase the medication online and have it sent to their home. Patient provided with handout reviewing treatment course and side effects and advised to call or message [place] on MyChart with any concerns.  Instructions for [facility] Medications  One or more of your medications was sent to the [facility] order [facility]. You will receive an email from them and can purchase the medicine through that link. It will then be mailed to your home at the address you confirmed. If for any reason you do not receive an email from them, please check your spam folder. If you still do not find the email, please let us know. [facility] phone number is [phone].   5-Fluorouracil/Calcipotriene Patient Education   Actinic keratoses are the dry, red scaly spots on the skin caused by sun damage. A portion of these spots can turn into skin cancer with time, and treating them can help prevent development of skin cancer.   Treatment of these spots requires removal of the defective skin cells. There are various ways to remove actinic keratoses, including freezing with liquid nitrogen, treatment with creams, or treatment with a blue light procedure in the office.   5-fluorouracil cream is a topical cream used to treat actinic keratoses. It works by interfering with the growth of abnormal fast-growing skin cells, such as actinic keratoses. These cells peel off and are replaced by healthy ones.   5-fluorouracil/calcipotriene is a combination of the 5-fluorouracil cream with a vitamin D analog cream called calcipotriene. The calcipotriene alone does not treat actinic keratoses. However, when it is combined with 5-fluorouracil, it helps the 5-fluorouracil treat the  actinic keratoses much faster so that the same results can be achieved with a much shorter treatment time.  INSTRUCTIONS FOR 5-FLUOROURACIL/CALCIPOTRIENE CREAM:   5-fluorouracil/calcipotriene cream typically only needs to be used for 4-7 days. A thin layer should be applied twice a day to the treatment areas recommended by your physician.   If your physician prescribed you separate tubes of 5-fluourouracil and calcipotriene, apply a thin layer of 5-fluorouracil followed by a thin layer of calcipotriene.   Avoid contact with your eyes, nostrils, and mouth. Do not use 5-fluorouracil/calcipotriene cream on infected or open wounds.   You will develop redness, irritation and some crusting at areas where you have pre-cancer damage/actinic keratoses. IF YOU DEVELOP PAIN, BLEEDING, OR SIGNIFICANT CRUSTING, STOP THE TREATMENT EARLY - you have already gotten a good response and the actinic keratoses should clear up well.  Wash your hands after applying 5-fluorouracil 5% cream on your skin.   A moisturizer or sunscreen with a minimum SPF 30 should be applied each morning.   Once you have finished the treatment, you can apply a thin layer of Vaseline twice a day to irritated areas to soothe and calm the areas more quickly. If you experience significant discomfort, contact your physician.  For some patients it is necessary to repeat the treatment for best results.  SIDE EFFECTS: When using 5-fluorouracil/calcipotriene cream, you may have mild irritation, such as redness, dryness, swelling, or a mild burning sensation. This usually resolves within 2 weeks. The more actinic keratoses you have, the more redness and inflammation you can expect during treatment. Eye irritation has been reported rarely. If this occurs, please let us know.  If  you have any trouble using this cream, please call the office. If you have any other questions about this information, please do not hesitate to ask me before you leave the  office.    Due to recent changes in healthcare laws, you may see results of your pathology and/or laboratory studies on MyChart before the doctors have had a chance to review them. We understand that in some cases there may be results that are confusing or concerning to you. Please understand that not all results are received at the same time and often the doctors may need to interpret multiple results in order to provide you with the best plan of care or course of treatment. Therefore, we ask that you please give [place] 2 business days to thoroughly review all your results before contacting the office for clarification. Should we see a critical lab result, you will be contacted sooner.   If You Need Anything After Your Visit  If you have any questions or concerns for your doctor, please call our main line at [phone] and press option 4 to reach your doctor's medical assistant. If no one answers, please leave a voicemail as directed and we will return your call as soon as possible. Messages left after 4 pm will be answered the following business day.   You may also send [place] a message via [place]. We typically respond to MyChart messages within 1-2 business days.  For prescription refills, please ask your pharmacy to contact our office. Our fax number is [phone].  If you have an urgent issue when the clinic is closed that cannot wait until the next business day, you can page your doctor at the number below.    Please note that while we do our best to be available for urgent issues outside of office hours, we are not available 24/7.   If you have an urgent issue and are unable to reach [place], you may choose to seek medical care at your doctor's office, retail clinic, urgent care center, or emergency room.  If you have a medical emergency, please immediately call 911 or go to the emergency department.  Pager Numbers  - Dr. Nehemiah Massed: [phone]  - Dr. Laurence Ferrari: [phone]  - Dr. Nicole Kindred:  [phone]  In the event of inclement weather, please call our main line at [phone] for an update on the status of any delays or closures.  Dermatology Medication Tips: Please keep the boxes that topical medications come in in order to help keep track of the instructions about where and how to use these. Pharmacies typically print the medication instructions only on the boxes and not directly on the medication tubes.   If your medication is too expensive, please contact our office at [phone] option 4 or send [place] a message through [place].   We are unable to tell what your co-pay for medications will be in advance as this is different depending on your insurance coverage. However, we may be able to find a substitute medication at lower cost or fill out paperwork to get insurance to cover a needed medication.   If a prior authorization is required to get your medication covered by your insurance company, please allow [place] 1-2 business days to complete this process.  Drug prices often vary depending on where the prescription is filled and some pharmacies may offer cheaper prices.  The website www.goodrx.com contains coupons for medications through different pharmacies. The prices here do not account for what the cost may be with help  from insurance (it may be cheaper with your insurance), but the website can give you the price if you did not use any insurance.  - You can print the associated coupon and take it with your prescription to the pharmacy.  - You may also stop by our office during regular business hours and pick up a GoodRx coupon card.  - If you need your prescription sent electronically to a different pharmacy, notify our office through [facility] or by phone at [phone] option 4.     Si Usted Necesita Algo Despus de Su Visita  Tambin puede enviarnos un mensaje a travs de Pharmacist, community. Por lo general respondemos a los mensajes de MyChart en el transcurso de 1 a 2  das hbiles.  Para renovar recetas, por favor pida a su farmacia que se ponga en contacto con nuestra oficina. Harland Dingwall de fax es [place] [phone].  Si tiene un asunto urgente cuando la clnica est cerrada y que no puede esperar hasta el siguiente da hbil, puede llamar/localizar a su doctor(a) al nmero que aparece a continuacin.   Por favor, tenga en cuenta que aunque hacemos todo lo posible para estar disponibles para asuntos urgentes fuera del horario de [place], no estamos disponibles las 24 horas del da, los 7 das de la [place].   Si tiene un problema urgente y no puede comunicarse con nosotros, puede optar por buscar atencin mdica  en el consultorio de su doctor(a), en una clnica privada, en un centro de atencin urgente o en una sala de emergencias.  Si tiene Engineering geologist, por favor llame inmediatamente al 911 o vaya a la sala de emergencias.  Nmeros de bper  - Dr. Nehemiah Massed: [phone]  - Dra. Moye: [phone]  - Dra. Nicole Kindred: [phone]  En caso de inclemencias del [place], por favor llame a Johnsie Kindred principal al [phone] para una actualizacin sobre el [place] de cualquier retraso o cierre.  Consejos para la medicacin en dermatologa: Por favor, guarde las cajas en las que vienen los medicamentos de uso tpico para ayudarle a seguir las instrucciones sobre dnde y cmo usarlos. Las farmacias generalmente imprimen las instrucciones del medicamento slo en las cajas y no directamente en los tubos del [place].   Si su medicamento es muy caro, por favor, pngase en contacto con Zigmund Daniel llamando al [phone] y presione la opcin 4 o envenos un mensaje a travs de Pharmacist, community.   No podemos decirle cul ser su copago por los medicamentos por adelantado ya que esto es diferente dependiendo de la cobertura de su seguro. Sin embargo, es posible que podamos encontrar un medicamento sustituto a Electrical engineer un formulario para que el  seguro cubra el medicamento que se considera necesario.   Si se requiere una autorizacin previa para que su compaa de seguros [place] su medicamento, por favor permtanos de 1 a 2 das hbiles para completar este proceso.  Los precios de los medicamentos varan con frecuencia dependiendo del Environmental consultant de dnde se surte la receta y alguna farmacias pueden ofrecer precios ms baratos.  El sitio web www.goodrx.com tiene cupones para medicamentos de Airline pilot. Los precios aqu no tienen en cuenta lo que podra costar con la ayuda del seguro (puede ser ms barato con su seguro), pero el sitio web puede darle el precio si no utiliz Research scientist (physical sciences).  - Puede imprimir el cupn correspondiente y llevarlo con su receta a la farmacia.  - Tambin puede pasar por nuestra oficina durante el horario  de atencin regular y Charity fundraiser una tarjeta de cupones de GoodRx.  - Si necesita que su receta se enve electrnicamente a una farmacia diferente, informe a nuestra oficina a travs de MyChart de [place] o por telfono llamando al [phone] y presione la opcin 4.

## 2022-03-04 NOTE — Progress Notes (Signed)
Follow-Up Visit   Subjective  Sean Raymond is a 76 y.o. male who presents for the following: Actinic Keratosis (Face, ears, 96mf/u), ISKs (Back, neck, R frontal scalp/hairline, 658m/u), and growth (L cheek, ~76m8mThe patient has spots, moles and lesions to be evaluated, some may be new or changing and the patient has concerns that these could be cancer.  The following portions of the chart were reviewed this encounter and updated as appropriate:   Tobacco  Allergies  Meds  Problems  Med Hx  Surg Hx  Fam Hx     Review of Systems:  No other skin or systemic complaints except as noted in HPI or Assessment and Plan.  Objective  Well appearing patient in no apparent distress; mood and affect are within normal limits.  A focused examination was performed including face, ears, back, arms. Relevant physical exam findings are noted in the Assessment and Plan.  R forehead x 1 Pink scaly macules  L low back x 1, R templ at hairline x 1, R preauricular x 1 (3) Stuck on waxy paps with erythema   Assessment & Plan   Actinic Damage with PreCancerous Actinic Keratoses Counseling for Topical Chemotherapy Management: Patient exhibits: - Severe, confluent actinic changes with pre-cancerous actinic keratoses that is secondary to cumulative UV radiation exposure over time - Condition that is severe; chronic, not at goal. - diffuse scaly erythematous macules and papules with underlying dyspigmentation - Discussed Prescription "Field Treatment" topical Chemotherapy for Severe, Chronic Confluent Actinic Changes with Pre-Cancerous Actinic Keratoses Field treatment involves treatment of an entire area of skin that has confluent Actinic Changes (Sun/ Ultraviolet light damage) and PreCancerous Actinic Keratoses by method of PhotoDynamic Therapy (PDT) and/or prescription Topical Chemotherapy agents such as 5-fluorouracil, 5-fluorouracil/calcipotriene, and/or imiquimod.  The purpose is to  decrease the number of clinically evident and subclinical PreCancerous lesions to prevent progression to development of skin cancer by chemically destroying early precancer changes that may or may not be visible.  It has been shown to reduce the risk of developing skin cancer in the treated area. As a result of treatment, redness, scaling, crusting, and open sores may occur during treatment course. One or more than one of these methods may be used and may have to be used several times to control, suppress and eliminate the PreCancerous changes. Discussed treatment course, expected reaction, and possible side effects. - Recommend daily broad spectrum sunscreen SPF 30+ to sun-exposed areas, reapply every 2 hours as needed.  - Staying in the shade or wearing long sleeves, sun glasses (UVA+UVB protection) and wide brim hats (4-inch brim around the entire circumference of the hat) are also recommended. - Call for new or changing lesions.   AK (actinic keratosis) R forehead x 1 Multiple Aks forehead, temples will txt with 5FU/Calcipotriene cream Destruction of lesion - R forehead x 1 Complexity: simple   Destruction method: cryotherapy   Informed consent: discussed and consent obtained   Timeout:  patient name, date of birth, surgical site, and procedure verified Lesion destroyed using liquid nitrogen: Yes   Region frozen until ice ball extended beyond lesion: Yes   Outcome: patient tolerated procedure well with no complications   Post-procedure details: wound care instructions given    fluorouracil (EFUDEX) 5 % cream - R forehead x 1 Apply topically 2 (two) times daily. Bid to forehead and temples  Inflamed seborrheic keratosis (3) L low back x 1, R templ at hairline x 1, R preauricular x 1  Symptomatic, irritating, patient would like treated. Destruction of lesion - L low back x 1, R templ at hairline x 1, R preauricular x 1 Complexity: simple   Destruction method: cryotherapy   Informed  consent: discussed and consent obtained   Timeout:  patient name, date of birth, surgical site, and procedure verified Lesion destroyed using liquid nitrogen: Yes   Region frozen until ice ball extended beyond lesion: Yes   Outcome: patient tolerated procedure well with no complications   Post-procedure details: wound care instructions given    --In January 2024 Start 5-fluorouracil/calcipotriene cream twice a day for 7 days to affected areas including forehead and temples. Prescription sent to [facility]. Patient advised they will receive an email to purchase the medication online and have it sent to their home. Patient provided with handout reviewing treatment course and side effects and advised to call or message [place] on MyChart with any concerns.  Reviewed course of treatment and expected reaction.  Patient advised to expect inflammation and crusting and advised that erosions are possible.  Patient advised to be diligent with sun protection during and after treatment. Counseled to keep medication out of reach of children and pets.   Return in about 6 months (around 09/02/2022) for AK f/u.  I, Othelia Pulling, RMA, am acting as scribe for Sarina Ser, MD . Documentation: I have reviewed the above documentation for accuracy and completeness, and I agree with the above.  Sarina Ser, MD

## 2022-03-15 ENCOUNTER — Encounter: Payer: Self-pay | Admitting: Dermatology

## 2022-04-10 ENCOUNTER — Inpatient Hospital Stay: Payer: Medicare Other | Attending: Oncology

## 2022-04-10 DIAGNOSIS — Z452 Encounter for adjustment and management of vascular access device: Secondary | ICD-10-CM | POA: Diagnosis not present

## 2022-04-10 DIAGNOSIS — Z8572 Personal history of non-Hodgkin lymphomas: Secondary | ICD-10-CM | POA: Diagnosis present

## 2022-04-10 DIAGNOSIS — Z95828 Presence of other vascular implants and grafts: Secondary | ICD-10-CM

## 2022-04-10 MED ORDER — SODIUM CHLORIDE 0.9% FLUSH
10.0000 mL | Freq: Once | INTRAVENOUS | Status: AC
  Administered 2022-04-10: 10 mL via INTRAVENOUS
  Filled 2022-04-10: qty 10

## 2022-04-10 MED ORDER — HEPARIN SOD (PORK) LOCK FLUSH 100 UNIT/ML IV SOLN
500.0000 [IU] | Freq: Once | INTRAVENOUS | Status: AC
  Administered 2022-04-10: 500 [IU] via INTRAVENOUS
  Filled 2022-04-10: qty 5

## 2022-04-23 ENCOUNTER — Other Ambulatory Visit: Payer: Self-pay

## 2022-04-23 DIAGNOSIS — L570 Actinic keratosis: Secondary | ICD-10-CM

## 2022-04-23 MED ORDER — FLUOROURACIL 5 % EX CREA: TOPICAL_CREAM | Freq: Two times a day (BID) | CUTANEOUS | 1 refills | Status: DC

## 2022-04-23 NOTE — Progress Notes (Signed)
Clarification on 5FU prescription. aw

## 2022-06-05 ENCOUNTER — Inpatient Hospital Stay: Payer: Medicare Other | Attending: Oncology

## 2022-06-05 DIAGNOSIS — Z8572 Personal history of non-Hodgkin lymphomas: Secondary | ICD-10-CM | POA: Insufficient documentation

## 2022-06-05 DIAGNOSIS — Z95828 Presence of other vascular implants and grafts: Secondary | ICD-10-CM

## 2022-06-05 DIAGNOSIS — Z452 Encounter for adjustment and management of vascular access device: Secondary | ICD-10-CM | POA: Diagnosis not present

## 2022-06-05 MED ORDER — SODIUM CHLORIDE 0.9% FLUSH
10.0000 mL | Freq: Once | INTRAVENOUS | Status: AC
  Administered 2022-06-05: 10 mL via INTRAVENOUS
  Filled 2022-06-05: qty 10

## 2022-06-05 MED ORDER — HEPARIN SOD (PORK) LOCK FLUSH 100 UNIT/ML IV SOLN
500.0000 [IU] | Freq: Once | INTRAVENOUS | Status: AC
  Administered 2022-06-05: 500 [IU] via INTRAVENOUS
  Filled 2022-06-05: qty 5

## 2022-06-10 ENCOUNTER — Ambulatory Visit (INDEPENDENT_AMBULATORY_CARE_PROVIDER_SITE_OTHER): Payer: Medicare Other | Admitting: Dermatology

## 2022-06-10 VITALS — BP 113/63

## 2022-06-10 DIAGNOSIS — L578 Other skin changes due to chronic exposure to nonionizing radiation: Secondary | ICD-10-CM | POA: Diagnosis not present

## 2022-06-10 DIAGNOSIS — L820 Inflamed seborrheic keratosis: Secondary | ICD-10-CM | POA: Diagnosis not present

## 2022-06-10 DIAGNOSIS — L570 Actinic keratosis: Secondary | ICD-10-CM | POA: Diagnosis not present

## 2022-06-10 DIAGNOSIS — L821 Other seborrheic keratosis: Secondary | ICD-10-CM

## 2022-06-10 NOTE — Progress Notes (Signed)
   Follow-Up Visit   Subjective  Sean Raymond is a 77 y.o. male who presents for the following: Other (Spots of neck and left hand that are bothersome). The patient has spots, moles and lesions to be evaluated, some may be new or changing and the patient has concerns that these could be cancer.  The following portions of the chart were reviewed this encounter and updated as appropriate:   Tobacco  Allergies  Meds  Problems  Med Hx  Surg Hx  Fam Hx     Review of Systems:  No other skin or systemic complaints except as noted in HPI or Assessment and Plan.  Objective  Well appearing patient in no apparent distress; mood and affect are within normal limits.  A focused examination was performed including face, neck, hands. Relevant physical exam findings are noted in the Assessment and Plan.  Right Anterior Neck x 1, left dorsum hand x 1, left manible x 1, scalp x 1 (4) Erythematous stuck-on, waxy papule or plaque  Left Dorsal Hand near base of thumb Erythematous thin papules/macules with gritty scale.    Assessment & Plan   Seborrheic Keratoses - Stuck-on, waxy, tan-brown papules and/or plaques  - Benign-appearing - Discussed benign etiology and prognosis. - Observe - Call for any changes  Inflamed seborrheic keratosis (4) Right Anterior Neck x 1, left dorsum hand x 1, left manible x 1, scalp x 1  Destruction of lesion - Right Anterior Neck x 1, left dorsum hand x 1, left manible x 1, scalp x 1 Complexity: simple   Destruction method: cryotherapy   Informed consent: discussed and consent obtained   Timeout:  patient name, date of birth, surgical site, and procedure verified Lesion destroyed using liquid nitrogen: Yes   Region frozen until ice ball extended beyond lesion: Yes   Outcome: patient tolerated procedure well with no complications   Post-procedure details: wound care instructions given    AK (actinic keratosis) Left Dorsal Hand near base of  thumb  Destruction of lesion - Left Dorsal Hand near base of thumb Complexity: simple   Destruction method: cryotherapy   Informed consent: discussed and consent obtained   Timeout:  patient name, date of birth, surgical site, and procedure verified Lesion destroyed using liquid nitrogen: Yes   Region frozen until ice ball extended beyond lesion: Yes   Outcome: patient tolerated procedure well with no complications   Post-procedure details: wound care instructions given    Actinic Damage - chronic, secondary to cumulative UV radiation exposure/sun exposure over time - diffuse scaly erythematous macules with underlying dyspigmentation - Recommend daily broad spectrum sunscreen SPF 30+ to sun-exposed areas, reapply every 2 hours as needed.  - Recommend staying in the shade or wearing long sleeves, sun glasses (UVA+UVB protection) and wide brim hats (4-inch brim around the entire circumference of the hat). - Call for new or changing lesions.  Return for Follow up as scheduled.  I, Ashok Cordia, CMA, am acting as scribe for Sarina Ser, MD . Documentation: I have reviewed the above documentation for accuracy and completeness, and I agree with the above.  Sarina Ser, MD

## 2022-06-10 NOTE — Patient Instructions (Signed)
 Cryotherapy Aftercare  Wash gently with soap and water everyday.   Apply Vaseline and Band-Aid daily until healed. Due to recent changes in healthcare laws, you may see results of your pathology and/or laboratory studies on MyChart before the doctors have had a chance to review them. We understand that in some cases there may be results that are confusing or concerning to you. Please understand that not all results are received at the same time and often the doctors may need to interpret multiple results in order to provide you with the best plan of care or course of treatment. Therefore, we ask that you please give us 2 business days to thoroughly review all your results before contacting the office for clarification. Should we see a critical lab result, you will be contacted sooner.   If You Need Anything After Your Visit  If you have any questions or concerns for your doctor, please call our main line at [phone] and press option 4 to reach your doctor's medical assistant. If no one answers, please leave a voicemail as directed and we will return your call as soon as possible. Messages left after 4 pm will be answered the following business day.   You may also send us a message via MyChart. We typically respond to MyChart messages within 1-2 business days.  For prescription refills, please ask your pharmacy to contact our office. Our fax number is [phone].  If you have an urgent issue when the clinic is closed that cannot wait until the next business day, you can page your doctor at the number below.    Please note that while we do our best to be available for urgent issues outside of office hours, we are not available 24/7.   If you have an urgent issue and are unable to reach us, you may choose to seek medical care at your doctor's office, retail clinic, urgent care center, or emergency room.  If you have a medical emergency, please immediately call 911 or go to the emergency  department.  Pager Numbers  - Dr. Kowalski: [phone]  - Dr. Moye: [phone]  - Dr. Stewart: [phone]  In the event of inclement weather, please call our main line at [phone] for an update on the status of any delays or closures.  Dermatology Medication Tips: Please keep the boxes that topical medications come in in order to help keep track of the instructions about where and how to use these. Pharmacies typically print the medication instructions only on the boxes and not directly on the medication tubes.   If your medication is too expensive, please contact our office at [phone] option 4 or send us a message through MyChart.   We are unable to tell what your co-pay for medications will be in advance as this is different depending on your insurance coverage. However, we may be able to find a substitute medication at lower cost or fill out paperwork to get insurance to cover a needed medication.   If a prior authorization is required to get your medication covered by your insurance company, please allow us 1-2 business days to complete this process.  Drug prices often vary depending on where the prescription is filled and some pharmacies may offer cheaper prices.  The website www.goodrx.com contains coupons for medications through different pharmacies. The prices here do not account for what the cost may be with help from insurance (it may be cheaper with your insurance), but the website can give you the   price if you did not use any insurance.  - You can print the associated coupon and take it with your prescription to the pharmacy.  - You may also stop by our office during regular business hours and pick up a GoodRx coupon card.  - If you need your prescription sent electronically to a different pharmacy, notify our office through [facility] MyChart or by phone at [phone] option 4.     Si Usted Necesita Algo Despus de Su Visita  Tambin puede enviarnos un  mensaje a travs de MyChart. Por lo general respondemos a los mensajes de MyChart en el transcurso de 1 a 2 das hbiles.  Para renovar recetas, por favor pida a su farmacia que se ponga en contacto con nuestra oficina. Nuestro nmero de fax es el [phone].  Si tiene un asunto urgente cuando la clnica est cerrada y que no puede esperar hasta el siguiente da hbil, puede llamar/localizar a su doctor(a) al nmero que aparece a continuacin.   Por favor, tenga en cuenta que aunque hacemos todo lo posible para estar disponibles para asuntos urgentes fuera del horario de oficina, no estamos disponibles las 24 horas del da, los 7 das de la semana.   Si tiene un problema urgente y no puede comunicarse con nosotros, puede optar por buscar atencin mdica  en el consultorio de su doctor(a), en una clnica privada, en un centro de atencin urgente o en una sala de emergencias.  Si tiene una emergencia mdica, por favor llame inmediatamente al 911 o vaya a la sala de emergencias.  Nmeros de bper  - Dr. Kowalski: [phone]  - Dra. Moye: [phone]  - Dra. Stewart: [phone]  En caso de inclemencias del tiempo, por favor llame a nuestra lnea principal al [phone] para una actualizacin sobre el estado de cualquier retraso o cierre.  Consejos para la medicacin en dermatologa: Por favor, guarde las cajas en las que vienen los medicamentos de uso tpico para ayudarle a seguir las instrucciones sobre dnde y cmo usarlos. Las farmacias generalmente imprimen las instrucciones del medicamento slo en las cajas y no directamente en los tubos del medicamento.   Si su medicamento es muy caro, por favor, pngase en contacto con nuestra oficina llamando al [phone] y presione la opcin 4 o envenos un mensaje a travs de MyChart.   No podemos decirle cul ser su copago por los medicamentos por adelantado ya que esto es diferente dependiendo de la cobertura de su seguro. Sin embargo,  es posible que podamos encontrar un medicamento sustituto a menor costo o llenar un formulario para que el seguro cubra el medicamento que se considera necesario.   Si se requiere una autorizacin previa para que su compaa de seguros cubra su medicamento, por favor permtanos de 1 a 2 das hbiles para completar este proceso.  Los precios de los medicamentos varan con frecuencia dependiendo del lugar de dnde se surte la receta y alguna farmacias pueden ofrecer precios ms baratos.  El sitio web www.goodrx.com tiene cupones para medicamentos de diferentes farmacias. Los precios aqu no tienen en cuenta lo que podra costar con la ayuda del seguro (puede ser ms barato con su seguro), pero el sitio web puede darle el precio si no utiliz ningn seguro.  - Puede imprimir el cupn correspondiente y llevarlo con su receta a la farmacia.  - Tambin puede pasar por nuestra oficina durante el horario de atencin regular y recoger una tarjeta de cupones de GoodRx.  - Si necesita que   su receta se enve electrnicamente a una farmacia diferente, informe a nuestra oficina a travs de MyChart de [facility] o por telfono llamando al [phone] y presione la opcin 4.  

## 2022-06-13 ENCOUNTER — Encounter: Payer: Self-pay | Admitting: Dermatology

## 2022-07-31 ENCOUNTER — Inpatient Hospital Stay: Payer: Medicare Other | Attending: Oncology

## 2022-07-31 DIAGNOSIS — Z95828 Presence of other vascular implants and grafts: Secondary | ICD-10-CM

## 2022-07-31 DIAGNOSIS — Z452 Encounter for adjustment and management of vascular access device: Secondary | ICD-10-CM | POA: Insufficient documentation

## 2022-07-31 DIAGNOSIS — Z8572 Personal history of non-Hodgkin lymphomas: Secondary | ICD-10-CM | POA: Diagnosis present

## 2022-07-31 MED ORDER — HEPARIN SOD (PORK) LOCK FLUSH 100 UNIT/ML IV SOLN
500.0000 [IU] | Freq: Once | INTRAVENOUS | Status: AC
  Administered 2022-07-31: 500 [IU] via INTRAVENOUS
  Filled 2022-07-31: qty 5

## 2022-07-31 MED ORDER — SODIUM CHLORIDE 0.9% FLUSH
10.0000 mL | Freq: Once | INTRAVENOUS | Status: AC
  Administered 2022-07-31: 10 mL via INTRAVENOUS
  Filled 2022-07-31: qty 10

## 2022-09-15 ENCOUNTER — Ambulatory Visit (INDEPENDENT_AMBULATORY_CARE_PROVIDER_SITE_OTHER): Payer: Medicare Other | Admitting: Dermatology

## 2022-09-15 VITALS — BP 109/60

## 2022-09-15 DIAGNOSIS — L820 Inflamed seborrheic keratosis: Secondary | ICD-10-CM

## 2022-09-15 DIAGNOSIS — L814 Other melanin hyperpigmentation: Secondary | ICD-10-CM

## 2022-09-15 DIAGNOSIS — D692 Other nonthrombocytopenic purpura: Secondary | ICD-10-CM

## 2022-09-15 DIAGNOSIS — X32XXXA Exposure to sunlight, initial encounter: Secondary | ICD-10-CM

## 2022-09-15 DIAGNOSIS — L570 Actinic keratosis: Secondary | ICD-10-CM | POA: Diagnosis not present

## 2022-09-15 DIAGNOSIS — W908XXA Exposure to other nonionizing radiation, initial encounter: Secondary | ICD-10-CM

## 2022-09-15 DIAGNOSIS — L729 Follicular cyst of the skin and subcutaneous tissue, unspecified: Secondary | ICD-10-CM

## 2022-09-15 DIAGNOSIS — L578 Other skin changes due to chronic exposure to nonionizing radiation: Secondary | ICD-10-CM

## 2022-09-15 DIAGNOSIS — L720 Epidermal cyst: Secondary | ICD-10-CM

## 2022-09-15 NOTE — Patient Instructions (Signed)
Cryotherapy Aftercare  Wash gently with soap and water everyday.   Apply Vaseline and Band-Aid daily until healed.     Due to recent changes in healthcare laws, you may see results of your pathology and/or laboratory studies on MyChart before the doctors have had a chance to review them. We understand that in some cases there may be results that are confusing or concerning to you. Please understand that not all results are received at the same time and often the doctors may need to interpret multiple results in order to provide you with the best plan of care or course of treatment. Therefore, we ask that you please give us 2 business days to thoroughly review all your results before contacting the office for clarification. Should we see a critical lab result, you will be contacted sooner.   If You Need Anything After Your Visit  If you have any questions or concerns for your doctor, please call our main line at [phone] and press option 4 to reach your doctor's medical assistant. If no one answers, please leave a voicemail as directed and we will return your call as soon as possible. Messages left after 4 pm will be answered the following business day.   You may also send us a message via MyChart. We typically respond to MyChart messages within 1-2 business days.  For prescription refills, please ask your pharmacy to contact our office. Our fax number is [phone].  If you have an urgent issue when the clinic is closed that cannot wait until the next business day, you can page your doctor at the number below.    Please note that while we do our best to be available for urgent issues outside of office hours, we are not available 24/7.   If you have an urgent issue and are unable to reach us, you may choose to seek medical care at your doctor's office, retail clinic, urgent care center, or emergency room.  If you have a medical emergency, please immediately call 911 or go to the  emergency department.  Pager Numbers  - Dr. Kowalski: [phone]  - Dr. Moye: [phone]  - Dr. Stewart: [phone]  In the event of inclement weather, please call our main line at [phone] for an update on the status of any delays or closures.  Dermatology Medication Tips: Please keep the boxes that topical medications come in in order to help keep track of the instructions about where and how to use these. Pharmacies typically print the medication instructions only on the boxes and not directly on the medication tubes.   If your medication is too expensive, please contact our office at [phone] option 4 or send us a message through MyChart.   We are unable to tell what your co-pay for medications will be in advance as this is different depending on your insurance coverage. However, we may be able to find a substitute medication at lower cost or fill out paperwork to get insurance to cover a needed medication.   If a prior authorization is required to get your medication covered by your insurance company, please allow us 1-2 business days to complete this process.  Drug prices often vary depending on where the prescription is filled and some pharmacies may offer cheaper prices.  The website www.goodrx.com contains coupons for medications through different pharmacies. The prices here do not account for what the cost may be with help from insurance (it may be cheaper with your insurance), but the website can   give you the price if you did not use any insurance.  - You can print the associated coupon and take it with your prescription to the pharmacy.  - You may also stop by our office during regular business hours and pick up a GoodRx coupon card.  - If you need your prescription sent electronically to a different pharmacy, notify our office through [facility] MyChart or by phone at [phone] option 4.     Si Usted Necesita Algo Despus de Su Visita  Tambin puede  enviarnos un mensaje a travs de MyChart. Por lo general respondemos a los mensajes de MyChart en el transcurso de 1 a 2 das hbiles.  Para renovar recetas, por favor pida a su farmacia que se ponga en contacto con nuestra oficina. Nuestro nmero de fax es el [phone].  Si tiene un asunto urgente cuando la clnica est cerrada y que no puede esperar hasta el siguiente da hbil, puede llamar/localizar a su doctor(a) al nmero que aparece a continuacin.   Por favor, tenga en cuenta que aunque hacemos todo lo posible para estar disponibles para asuntos urgentes fuera del horario de oficina, no estamos disponibles las 24 horas del da, los 7 das de la semana.   Si tiene un problema urgente y no puede comunicarse con nosotros, puede optar por buscar atencin mdica  en el consultorio de su doctor(a), en una clnica privada, en un centro de atencin urgente o en una sala de emergencias.  Si tiene una emergencia mdica, por favor llame inmediatamente al 911 o vaya a la sala de emergencias.  Nmeros de bper  - Dr. Kowalski: [phone]  - Dra. Moye: [phone]  - Dra. Stewart: [phone]  En caso de inclemencias del tiempo, por favor llame a nuestra lnea principal al [phone] para una actualizacin sobre el estado de cualquier retraso o cierre.  Consejos para la medicacin en dermatologa: Por favor, guarde las cajas en las que vienen los medicamentos de uso tpico para ayudarle a seguir las instrucciones sobre dnde y cmo usarlos. Las farmacias generalmente imprimen las instrucciones del medicamento slo en las cajas y no directamente en los tubos del medicamento.   Si su medicamento es muy caro, por favor, pngase en contacto con nuestra oficina llamando al [phone] y presione la opcin 4 o envenos un mensaje a travs de MyChart.   No podemos decirle cul ser su copago por los medicamentos por adelantado ya que esto es diferente dependiendo de la cobertura de su seguro.  Sin embargo, es posible que podamos encontrar un medicamento sustituto a menor costo o llenar un formulario para que el seguro cubra el medicamento que se considera necesario.   Si se requiere una autorizacin previa para que su compaa de seguros cubra su medicamento, por favor permtanos de 1 a 2 das hbiles para completar este proceso.  Los precios de los medicamentos varan con frecuencia dependiendo del lugar de dnde se surte la receta y alguna farmacias pueden ofrecer precios ms baratos.  El sitio web www.goodrx.com tiene cupones para medicamentos de diferentes farmacias. Los precios aqu no tienen en cuenta lo que podra costar con la ayuda del seguro (puede ser ms barato con su seguro), pero el sitio web puede darle el precio si no utiliz ningn seguro.  - Puede imprimir el cupn correspondiente y llevarlo con su receta a la farmacia.  - Tambin puede pasar por nuestra oficina durante el horario de atencin regular y recoger una tarjeta de cupones de GoodRx.  -   Si necesita que su receta se enve electrnicamente a una farmacia diferente, informe a nuestra oficina a travs de MyChart de  o por telfono llamando al [phone] y presione la opcin 4.  

## 2022-09-15 NOTE — Progress Notes (Signed)
Follow-Up Visit   Subjective  Sean Raymond is a 76 y.o. male who presents for the following: AK follow up - face treated with LN2 and 5FU/Calcipotriene cream The patient has spots, moles and lesions to be evaluated, some may be new or changing and the patient may have concern these could be cancer.  The following portions of the chart were reviewed this encounter and updated as appropriate: medications, allergies, medical history  Review of Systems:  No other skin or systemic complaints except as noted in HPI or Assessment and Plan.  Objective  Well appearing patient in no apparent distress; mood and affect are within normal limits. A focused examination was performed of the following areas: Face Relevant exam findings are noted in the Assessment and Plan.  Left face x 1, left ear x 1, left arm x 3 (5) Erythematous thin papules/macules with gritty scale.   Face (3) Erythematous stuck-on, waxy papule or plaque   Assessment & Plan   Milia - left lower eyelid - tiny firm white papules - type of cyst - benign - may be extracted if symptomatic - observe  AK (actinic keratosis) (5) Left face x 1, left ear x 1, left arm x 3  ACTINIC DAMAGE - chronic, secondary to cumulative UV radiation exposure/sun exposure over time - diffuse scaly erythematous macules with underlying dyspigmentation - Recommend daily broad spectrum sunscreen SPF 30+ to sun-exposed areas, reapply every 2 hours as needed.  - Recommend staying in the shade or wearing long sleeves, sun glasses (UVA+UVB protection) and wide brim hats (4-inch brim around the entire circumference of the hat). - Call for new or changing lesions.   Destruction of lesion - Left face x 1, left ear x 1, left arm x 3 Complexity: simple   Destruction method: cryotherapy   Informed consent: discussed and consent obtained   Timeout:  patient name, date of birth, surgical site, and procedure verified Lesion destroyed using  liquid nitrogen: Yes   Region frozen until ice ball extended beyond lesion: Yes   Outcome: patient tolerated procedure well with no complications   Post-procedure details: wound care instructions given    Inflamed seborrheic keratosis (3) Face  Symptomatic, irritating, patient would like treated.  Benign-appearing.  Call clinic for new or changing lesions.   Prior to procedure, discussed risks of blister formation, small wound, skin dyspigmentation, or rare scar following treatment. Recommend Vaseline ointment to treated areas while healing.   Destruction of lesion - Face Complexity: simple   Destruction method: cryotherapy   Informed consent: discussed and consent obtained   Timeout:  patient name, date of birth, surgical site, and procedure verified Lesion destroyed using liquid nitrogen: Yes   Region frozen until ice ball extended beyond lesion: Yes   Outcome: patient tolerated procedure well with no complications   Post-procedure details: wound care instructions given    Purpura - Chronic; persistent and recurrent.  Treatable, but not curable. - Violaceous macules and patches - Benign - Related to trauma, age, sun damage and/or use of blood thinners, chronic use of topical and/or oral steroids - Observe - Can use OTC arnica containing moisturizer such as Dermend Bruise Formula if desired - Call for worsening or other concerns  LENTIGINES Exam: scattered tan macules Due to sun exposure Treatment Plan: Benign-appearing, observe. Recommend daily broad spectrum sunscreen SPF 30+ to sun-exposed areas, reapply every 2 hours as needed.  Call for any changes   Return in about 6 months (around 03/17/2023) for Follow  up.  I, Joanie Coddington, CMA, am acting as scribe for Armida Sans, MD .  Documentation: I have reviewed the above documentation for accuracy and completeness, and I agree with the above.  Armida Sans, MD

## 2022-09-18 ENCOUNTER — Encounter: Payer: Self-pay | Admitting: Dermatology

## 2022-09-25 ENCOUNTER — Inpatient Hospital Stay: Payer: Medicare Other | Attending: Oncology

## 2022-09-25 DIAGNOSIS — Z8572 Personal history of non-Hodgkin lymphomas: Secondary | ICD-10-CM | POA: Diagnosis present

## 2022-09-25 DIAGNOSIS — Z452 Encounter for adjustment and management of vascular access device: Secondary | ICD-10-CM | POA: Diagnosis present

## 2022-09-25 DIAGNOSIS — Z95828 Presence of other vascular implants and grafts: Secondary | ICD-10-CM

## 2022-09-25 MED ORDER — SODIUM CHLORIDE 0.9% FLUSH
10.0000 mL | Freq: Once | INTRAVENOUS | Status: AC
  Administered 2022-09-25: 10 mL via INTRAVENOUS
  Filled 2022-09-25: qty 10

## 2022-09-25 MED ORDER — HEPARIN SOD (PORK) LOCK FLUSH 100 UNIT/ML IV SOLN
500.0000 [IU] | Freq: Once | INTRAVENOUS | Status: AC
  Administered 2022-09-25: 500 [IU] via INTRAVENOUS
  Filled 2022-09-25: qty 5

## 2022-11-20 ENCOUNTER — Inpatient Hospital Stay: Payer: Medicare Other

## 2022-11-24 ENCOUNTER — Inpatient Hospital Stay: Payer: Medicare Other | Attending: Oncology

## 2022-11-24 DIAGNOSIS — Z452 Encounter for adjustment and management of vascular access device: Secondary | ICD-10-CM | POA: Insufficient documentation

## 2022-11-24 DIAGNOSIS — Z8572 Personal history of non-Hodgkin lymphomas: Secondary | ICD-10-CM | POA: Diagnosis present

## 2022-11-24 DIAGNOSIS — Z95828 Presence of other vascular implants and grafts: Secondary | ICD-10-CM

## 2022-11-24 MED ORDER — HEPARIN SOD (PORK) LOCK FLUSH 100 UNIT/ML IV SOLN
500.0000 [IU] | Freq: Once | INTRAVENOUS | Status: AC
  Administered 2022-11-24: 500 [IU] via INTRAVENOUS
  Filled 2022-11-24: qty 5

## 2022-11-24 MED ORDER — SODIUM CHLORIDE 0.9% FLUSH
10.0000 mL | Freq: Once | INTRAVENOUS | Status: AC
  Administered 2022-11-24: 10 mL via INTRAVENOUS
  Filled 2022-11-24: qty 10

## 2022-12-28 ENCOUNTER — Other Ambulatory Visit: Payer: Medicare Other

## 2022-12-28 ENCOUNTER — Ambulatory Visit
Admission: RE | Admit: 2022-12-28 | Discharge: 2022-12-28 | Disposition: A | Discharge status: Home / Self Care | Payer: Medicare Other | Source: Ambulatory Visit | Attending: Oncology | Admitting: Oncology

## 2022-12-28 ENCOUNTER — Other Ambulatory Visit: Payer: Self-pay

## 2022-12-28 ENCOUNTER — Inpatient Hospital Stay: Payer: Medicare Other | Attending: Oncology

## 2022-12-28 DIAGNOSIS — Z8572 Personal history of non-Hodgkin lymphomas: Secondary | ICD-10-CM | POA: Insufficient documentation

## 2022-12-28 DIAGNOSIS — Z95828 Presence of other vascular implants and grafts: Secondary | ICD-10-CM

## 2022-12-28 DIAGNOSIS — C8211 Follicular lymphoma grade II, lymph nodes of head, face, and neck: Secondary | ICD-10-CM

## 2022-12-28 LAB — CMP (CANCER CENTER ONLY)
ALT: 16 U/L (ref 0–44)
AST: 33 U/L (ref 15–41)
Albumin: 3.4 g/dL — ABNORMAL LOW (ref 3.5–5.0)
Alkaline Phosphatase: 73 U/L (ref 38–126)
Anion gap: 10 (ref 5–15)
BUN: 9 mg/dL (ref 8–23)
CO2: 23 mmol/L (ref 22–32)
Calcium: 8.3 mg/dL — ABNORMAL LOW (ref 8.9–10.3)
Chloride: 105 mmol/L (ref 98–111)
Creatinine: 0.99 mg/dL (ref 0.61–1.24)
GFR, Estimated: >60 mL/min (ref >60)
Glucose, Bld: 105 mg/dL — ABNORMAL HIGH (ref 70–99)
Potassium: 3.5 mmol/L (ref 3.5–5.1)
Sodium: 138 mmol/L (ref 135–145)
Total Bilirubin: 0.7 mg/dL (ref 0.3–1.2)
Total Protein: 5.5 g/dL — ABNORMAL LOW (ref 6.5–8.1)

## 2022-12-28 LAB — CBC WITH DIFFERENTIAL (CANCER CENTER ONLY)
Abs Immature Granulocytes: 0.02 10*3/uL (ref 0.00–0.07)
Basophils Absolute: 0 10*3/uL (ref 0.0–0.1)
Basophils Relative: 1 %
Eosinophils Absolute: 0.1 10*3/uL (ref 0.0–0.5)
Eosinophils Relative: 3 %
HCT: 33.9 % — ABNORMAL LOW (ref 39.0–52.0)
Hemoglobin: 10.9 g/dL — ABNORMAL LOW (ref 13.0–17.0)
Immature Granulocytes: 1 %
Lymphocytes Relative: 25 %
Lymphs Abs: 1 10*3/uL (ref 0.7–4.0)
MCH: 29 pg (ref 26.0–34.0)
MCHC: 32.2 g/dL (ref 30.0–36.0)
MCV: 90.2 fL (ref 80.0–100.0)
Monocytes Absolute: 0.6 10*3/uL (ref 0.1–1.0)
Monocytes Relative: 15 %
Neutro Abs: 2.2 10*3/uL (ref 1.7–7.7)
Neutrophils Relative %: 55 %
Platelet Count: 114 10*3/uL — ABNORMAL LOW (ref 150–400)
RBC: 3.76 MIL/uL — ABNORMAL LOW (ref 4.22–5.81)
RDW: 14.1 % (ref 11.5–15.5)
WBC Count: 3.9 10*3/uL — ABNORMAL LOW (ref 4.0–10.5)
nRBC: 0 % (ref 0.0–0.2)

## 2022-12-28 MED ORDER — IOHEXOL 300 MG/ML  SOLN
100.0000 mL | Freq: Once | INTRAMUSCULAR | Status: AC | PRN
  Administered 2022-12-28: 100 mL via INTRAVENOUS

## 2022-12-28 MED ORDER — SODIUM CHLORIDE 0.9% FLUSH
10.0000 mL | INTRAVENOUS | Status: DC | PRN
  Administered 2022-12-28: 10 mL via INTRAVENOUS
  Filled 2022-12-28: qty 10

## 2022-12-28 MED ORDER — HEPARIN SOD (PORK) LOCK FLUSH 100 UNIT/ML IV SOLN
500.0000 [IU] | Freq: Once | INTRAVENOUS | Status: AC
  Administered 2022-12-28: 500 [IU] via INTRAVENOUS

## 2022-12-28 MED ORDER — HEPARIN SOD (PORK) LOCK FLUSH 100 UNIT/ML IV SOLN
500.0000 [IU] | Freq: Once | INTRAVENOUS | Status: DC
  Filled 2022-12-28: qty 5

## 2022-12-28 MED ORDER — BARIUM SULFATE 2 % PO SUSP
450.0000 mL | ORAL | Status: AC
  Administered 2022-12-28 (×2): 450 mL via ORAL

## 2022-12-28 MED ORDER — SODIUM CHLORIDE 0.9 % IV SOLN: INTRAVENOUS | Status: DC

## 2022-12-31 ENCOUNTER — Inpatient Hospital Stay: Payer: Medicare Other | Admitting: Oncology

## 2023-01-05 ENCOUNTER — Encounter: Payer: Self-pay | Admitting: Oncology

## 2023-01-05 ENCOUNTER — Inpatient Hospital Stay: Payer: Medicare Other | Attending: Oncology | Admitting: Oncology

## 2023-01-05 VITALS — BP 114/54 | HR 83 | Temp 98.1°F | Resp 16 | Ht 68.5 in | Wt 209.5 lb

## 2023-01-05 DIAGNOSIS — Z800 Family history of malignant neoplasm of digestive organs: Secondary | ICD-10-CM | POA: Diagnosis not present

## 2023-01-05 DIAGNOSIS — Z5112 Encounter for antineoplastic immunotherapy: Secondary | ICD-10-CM | POA: Insufficient documentation

## 2023-01-05 DIAGNOSIS — Z801 Family history of malignant neoplasm of trachea, bronchus and lung: Secondary | ICD-10-CM | POA: Insufficient documentation

## 2023-01-05 DIAGNOSIS — D61818 Other pancytopenia: Secondary | ICD-10-CM | POA: Insufficient documentation

## 2023-01-05 DIAGNOSIS — C8218 Follicular lymphoma grade II, lymph nodes of multiple sites: Secondary | ICD-10-CM | POA: Diagnosis not present

## 2023-01-05 DIAGNOSIS — Z85828 Personal history of other malignant neoplasm of skin: Secondary | ICD-10-CM | POA: Diagnosis not present

## 2023-01-05 DIAGNOSIS — Z79899 Other long term (current) drug therapy: Secondary | ICD-10-CM | POA: Insufficient documentation

## 2023-01-05 DIAGNOSIS — C8210 Follicular lymphoma grade II, unspecified site: Secondary | ICD-10-CM | POA: Insufficient documentation

## 2023-01-05 DIAGNOSIS — C8211 Follicular lymphoma grade II, lymph nodes of head, face, and neck: Secondary | ICD-10-CM | POA: Insufficient documentation

## 2023-01-05 NOTE — Progress Notes (Signed)
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  He has noticed increased fatigue over the past several months, but otherwise feels well and is asymptomatic. 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 He has no neurologic complaints.  He denies any fevers, weight loss, or night sweats.  He has noted no new lymphadenopathy.  He denies any chest pain, shortness of breath, cough, or hemoptysis.  He denies any nausea, vomiting, constipation, or diarrhea.  He has no urinary complaints.  Patient offers no further specific complaints today.  REVIEW OF SYSTEMS:   Review of Systems  Constitutional:  Positive for malaise/fatigue. Negative for diaphoresis, fever and weight loss.  Eyes:  Positive for double vision. Negative for blurred vision and pain.  Respiratory: Negative.  Negative for cough and shortness of breath.   Cardiovascular: Negative.  Negative for chest pain and leg swelling.  Gastrointestinal: Negative.  Negative for abdominal pain.  Genitourinary: Negative.  Negative for dysuria.  Musculoskeletal: Negative.  Negative for neck pain.  Skin: Negative.  Negative for rash.  Neurological: Negative.  Negative for sensory change, focal weakness, weakness and headaches.  Psychiatric/Behavioral: Negative.  The patient is not nervous/anxious.     As per HPI. Otherwise, a complete review of systems is negative.  PAST MEDICAL HISTORY: Past Medical History:  Diagnosis Date   Follicular lymphoma (HCC) 2009   Patient had chemo + rad tx's and zevaline shots./ no flare ups since 2015   GERD  (gastroesophageal reflux disease)    Heart murmur    past finding by Dr Judithann Sheen   Hemorrhoids    Hypercholesterolemia    Hypertension    started as a preventative   Seasonal allergies    Sleep apnea    CPAP   Squamous cell carcinoma of skin 02/10/2021   Left hand base of hand - [facility]   Transfusion history    Vertigo 5 yrs ago   positional vertigo    PAST SURGICAL HISTORY: Hemorrhoidectomy.  FAMILY HISTORY: Colon cancer and lung cancer.     ADVANCED DIRECTIVES:    HEALTH MAINTENANCE: Social History   Tobacco Use   Smoking status: Never   Smokeless tobacco: Never  Substance Use Topics   Alcohol use: Yes    Alcohol/week: 2.0 standard drinks of alcohol    Types: 2 Cans of beer per week    Comment: occasional   Drug use: No     Allergies  Allergen Reactions   Tape Rash    bandaids    Current Outpatient Medications  Medication Sig Dispense Refill   alfuzosin (UROXATRAL) 10 MG 24 hr tablet Take 10 mg by mouth daily.     amLODipine (NORVASC) 5 MG tablet Take 5 mg by mouth daily.      aspirin EC 81 MG tablet Take 81 mg by mouth daily.      atorvastatin (LIPITOR) 10 MG tablet Take 10 mg by mouth daily at 6 PM.      Calcium Carbonate-Vitamin D 600-400 MG-UNIT per tablet Take 1 tablet by mouth daily.      cetirizine (ZYRTEC) 10 MG tablet Take 10 mg by mouth daily.  Multiple Vitamin (MULTI-VITAMINS) TABS Take 1 tablet by mouth daily.      omeprazole (PRILOSEC) 40 MG capsule Take 40 mg by mouth daily.      tadalafil (CIALIS) 20 MG tablet Take by mouth daily as needed.      tamsulosin (FLOMAX) 0.4 MG CAPS capsule Take 1 capsule by mouth daily.     MYRBETRIQ 50 MG TB24 tablet Take 50 mg by mouth daily. (Patient not taking: Reported on 01/05/2023)     No current facility-administered medications for this visit.    OBJECTIVE: Vitals:   01/05/23 1305  BP: (!) 114/54  Pulse: 83  Resp: 16  Temp: 98.1 F (36.7 C)  SpO2: 95%     Body mass index is 31.39 kg/m.    ECOG  FS:0 - Asymptomatic  General: Well-developed, well-nourished, no acute distress. Eyes: Pink conjunctiva, anicteric sclera. HEENT: Normocephalic, moist mucous membranes. Lungs: No audible wheezing or coughing. Heart: Regular rate and rhythm. Abdomen: Soft, nontender, no obvious distention. Musculoskeletal: No edema, cyanosis, or clubbing. Neuro: Alert, answering all questions appropriately. Cranial nerves grossly intact. Skin: No rashes or petechiae noted. Psych: Normal affect. Lymphatics: No palpable lymphadenopathy.  LAB RESULTS:  Lab Results  Component Value Date   NA 138 12/28/2022   K 3.5 12/28/2022   CL 105 12/28/2022   CO2 23 12/28/2022   GLUCOSE 105 (H) 12/28/2022   BUN 9 12/28/2022   CREATININE 0.99 12/28/2022   CALCIUM 8.3 (L) 12/28/2022   PROT 5.5 (L) 12/28/2022   ALBUMIN 3.4 (L) 12/28/2022   AST 33 12/28/2022   ALT 16 12/28/2022   ALKPHOS 73 12/28/2022   BILITOT 0.7 12/28/2022   GFRNONAA >60 12/28/2022   GFRAA >60 12/21/2019    Lab Results  Component Value Date   WBC 3.9 (L) 12/28/2022   NEUTROABS 2.2 12/28/2022   HGB 10.9 (L) 12/28/2022   HCT 33.9 (L) 12/28/2022   MCV 90.2 12/28/2022   PLT 114 (L) 12/28/2022     STUDIES: CT MAXILLOFACIAL W CONTRAST  Result Date: 01/05/2023 CLINICAL DATA:  History of follicular lymphoma in the head, face, and neck. Diagnosed in 2009 status post chemoradiation EXAM: CT MAXILLOFACIAL WITH CONTRAST; CT NECK WITH CONTRAST TECHNIQUE: Multidetector CT imaging of the neck and maxillofacial structures was performed with intravenous contrast. Multiplanar CT image reconstructions were also generated. RADIATION DOSE REDUCTION: This exam was performed according to the departmental dose-optimization program which includes automated exposure control, adjustment of the mA and/or kV according to patient size and/or use of iterative reconstruction technique. CONTRAST:  OMNIPAQUE IOHEXOL 300 MG/ML  SOLN COMPARISON:  CT neck and  maxillofacial 12/24/2021 FINDINGS: CT MAXILLOFACIAL Osseous: There is no acute osseous abnormality or suspicious osseous lesion. There is no evidence of mandibular dislocation. Orbits: Soft tissue thickening along the inferior aspect of the left globe is unchanged there is no measurable mass lesion or proptosis. Bilateral lens implants are in place. Thinning of the lacrimal glands is unchanged. Sinuses: Clear. The mastoid air cells and middle ear cavities are clear. Soft tissues: The facial soft tissues are unremarkable. See below for findings along the aerodigestive tract and lymphadenopathy. Limited intracranial: Unremarkable. CT NECK Pharynx and larynx: Soft tissue thickening and prominence of the right adenoid tonsils is increased since the prior study (3-19). The nasal cavity and nasopharynx are otherwise unremarkable. The oral cavity and oropharynx are unremarkable. The parapharyngeal spaces are clear. The hypopharynx and larynx are unremarkable. The vocal folds are grossly unremarkable. The epiglottis is unremarkable. There is  no retropharyngeal collection. The airway is patent. Salivary glands: Again seen is heterogeneity of the parotid glands. There is increased bulk of the superficial lobe on the left compared to the prior study. The submandibular glands are stable in appearance. Thyroid: Unremarkable. Lymph nodes: - A 1.1 cm left level I a node is increased in size from 0.8 cm (3-68). - A 1.8 cm x 2.4 cm x 2.5 cm nodal conglomerate in the left posterior triangle appears increased in size, previously measuring 2.3 cm x 1.1 cm x 2.2 cm (3-59). Superomedial to this nodal conglomerate at level IIA, and 8 mm short axis node is increased in size from 6 mm (3-57). Anterior to the nodal conglomerate at level III a 9 mm node is increased in size from 8 mm (3-64). - A 2.9 cm x 2.4 cm conglomerate in the posterior triangle is increased in size from 2.5 cm x 1.8 cm (3-66). - A 1.9 cm x 3.4 cm supraclavicular node on  the left is increased in size from 1.8 cm x 2.8 cm (3-75). - More laterally in the supraclavicular fossa a 1.1 cm short axis node is increased in size from 1.0 cm (3-77). - On the right, a 9 mm short axis level IIB node is increased in size from 8 mm (3-35). - A 1.3 cm x 1.5 cm level IIA node is increased in size from 1.0 cm x 1.3 cm (3-49). - A 1.7 cm x 2.3 cm right level IV node is increased in size from 1.3 cm x 1.9 cm (3-77). - Right infraclavicular lymphadenopathy overall appears increased in bulk. Vascular: The major vasculature of the neck is unremarkable. Skeleton: There is multilevel degenerative change in the cervical spine. There is no acute osseous abnormality or suspicious osseous lesion. Upper chest: Assessed on the separately dictated CT chest. Other: None. IMPRESSION: 1. Increased size and bulk of bilateral cervical lymph nodes concerning for disease progression. 2. Increased soft tissue thickening and prominence of the right adenoid tonsils, and fullness of the left parotid gland also concerning for lymphomatous involvement. 3. Unchanged soft tissue thickening along the inferior aspect of the left globe. Electronically Signed   By: Lesia Hausen M.D.   On: 01/05/2023 13:40   CT SOFT TISSUE NECK W CONTRAST  Result Date: 01/05/2023 CLINICAL DATA:  History of follicular lymphoma in the head, face, and neck. Diagnosed in 2009 status post chemoradiation EXAM: CT MAXILLOFACIAL WITH CONTRAST; CT NECK WITH CONTRAST TECHNIQUE: Multidetector CT imaging of the neck and maxillofacial structures was performed with intravenous contrast. Multiplanar CT image reconstructions were also generated. RADIATION DOSE REDUCTION: This exam was performed according to the departmental dose-optimization program which includes automated exposure control, adjustment of the mA and/or kV according to patient size and/or use of iterative reconstruction technique. CONTRAST:  OMNIPAQUE IOHEXOL 300 MG/ML  SOLN COMPARISON:  CT  neck and maxillofacial 12/24/2021 FINDINGS: CT MAXILLOFACIAL Osseous: There is no acute osseous abnormality or suspicious osseous lesion. There is no evidence of mandibular dislocation. Orbits: Soft tissue thickening along the inferior aspect of the left globe is unchanged there is no measurable mass lesion or proptosis. Bilateral lens implants are in place. Thinning of the lacrimal glands is unchanged. Sinuses: Clear. The mastoid air cells and middle ear cavities are clear. Soft tissues: The facial soft tissues are unremarkable. See below for findings along the aerodigestive tract and lymphadenopathy. Limited intracranial: Unremarkable. CT NECK Pharynx and larynx: Soft tissue thickening and prominence of the right adenoid tonsils is increased  since the prior study (3-19). The nasal cavity and nasopharynx are otherwise unremarkable. The oral cavity and oropharynx are unremarkable. The parapharyngeal spaces are clear. The hypopharynx and larynx are unremarkable. The vocal folds are grossly unremarkable. The epiglottis is unremarkable. There is no retropharyngeal collection. The airway is patent. Salivary glands: Again seen is heterogeneity of the parotid glands. There is increased bulk of the superficial lobe on the left compared to the prior study. The submandibular glands are stable in appearance. Thyroid: Unremarkable. Lymph nodes: - A 1.1 cm left level I a node is increased in size from 0.8 cm (3-68). - A 1.8 cm x 2.4 cm x 2.5 cm nodal conglomerate in the left posterior triangle appears increased in size, previously measuring 2.3 cm x 1.1 cm x 2.2 cm (3-59). Superomedial to this nodal conglomerate at level IIA, and 8 mm short axis node is increased in size from 6 mm (3-57). Anterior to the nodal conglomerate at level III a 9 mm node is increased in size from 8 mm (3-64). - A 2.9 cm x 2.4 cm conglomerate in the posterior triangle is increased in size from 2.5 cm x 1.8 cm (3-66). - A 1.9 cm x 3.4 cm supraclavicular  node on the left is increased in size from 1.8 cm x 2.8 cm (3-75). - More laterally in the supraclavicular fossa a 1.1 cm short axis node is increased in size from 1.0 cm (3-77). - On the right, a 9 mm short axis level IIB node is increased in size from 8 mm (3-35). - A 1.3 cm x 1.5 cm level IIA node is increased in size from 1.0 cm x 1.3 cm (3-49). - A 1.7 cm x 2.3 cm right level IV node is increased in size from 1.3 cm x 1.9 cm (3-77). - Right infraclavicular lymphadenopathy overall appears increased in bulk. Vascular: The major vasculature of the neck is unremarkable. Skeleton: There is multilevel degenerative change in the cervical spine. There is no acute osseous abnormality or suspicious osseous lesion. Upper chest: Assessed on the separately dictated CT chest. Other: None. IMPRESSION: 1. Increased size and bulk of bilateral cervical lymph nodes concerning for disease progression. 2. Increased soft tissue thickening and prominence of the right adenoid tonsils, and fullness of the left parotid gland also concerning for lymphomatous involvement. 3. Unchanged soft tissue thickening along the inferior aspect of the left globe. Electronically Signed   By: Lesia Hausen M.D.   On: 01/05/2023 13:40   CT CHEST ABDOMEN PELVIS W CONTRAST  Result Date: 12/28/2022 CLINICAL DATA:  Follicular lymphoma of the neck and face, restaging. * Tracking Code: BO * EXAM: CT CHEST, ABDOMEN, AND PELVIS WITH CONTRAST TECHNIQUE: Multidetector CT imaging of the chest, abdomen and pelvis was performed following the standard protocol during bolus administration of intravenous contrast. RADIATION DOSE REDUCTION: This exam was performed according to the departmental dose-optimization program which includes automated exposure control, adjustment of the mA and/or kV according to patient size and/or use of iterative reconstruction technique. CONTRAST:  OMNIPAQUE IOHEXOL 300 MG/ML  SOLN COMPARISON:  Multiple priors including CT December 24, 2021 FINDINGS: Please refer to same day CT of the neck for findings above the clavicles. CT CHEST FINDINGS Cardiovascular: Accessed right chest Port-A-Cath with tip in the right atrium. Aortic atherosclerosis. No central pulmonary embolus on this nondedicated study. Normal size heart. No significant pericardial effusion/thickening. Mediastinum/Nodes: No suspicious thyroid nodule. Increased size of the mediastinal and bilateral axillary lymph nodes as well as the  hilar nodal tissue. For reference: -left axillary lymph node measures 11 mm in short axis on image 15/3 previously 10 mm. -right axillary lymph node measures 13 mm in short axis on image 15/3 previously 9 mm. -prevascular lymph node measures 10 mm in short axis on image 15/3 previously 7 mm. The esophagus is grossly unremarkable. Lungs/Pleura: No suspicious pulmonary nodules or masses. Azygous fissure, anatomic variant. Hypoventilatory change in the dependent lungs. Chronic left lower lobe atelectasis. Musculoskeletal: No aggressive lytic or blastic lesion of bone. CT ABDOMEN PELVIS FINDINGS Hepatobiliary: Stable hypodense lesion in segment V on image 61/3 likely reflecting a benign finding. No new suspicious hepatic lesion. Cholelithiasis without findings of acute cholecystitis. No biliary ductal dilation. Pancreas: No pancreatic ductal dilation or evidence of acute inflammation. Spleen: Stable small splenic hypodensities. Splenomegaly measuring 19.9 cm in maximum axial dimension, unchanged. Adrenals/Urinary Tract: Bilateral adrenal nodules are previously reported is stable dating back to 2013, compatible with adenomas. No hydronephrosis. Kidneys demonstrate symmetric enhancement. Similar mild wall thickening of a nondistended urinary bladder. Stomach/Bowel: Radiopaque enteric contrast material traverses the rectum. Stomach is unremarkable for degree of distension. No pathologic dilation of small or large bowel. No evidence of acute bowel inflammation.  Colonic diverticulosis without findings of acute diverticulitis. Vascular/Lymphatic: Normal caliber abdominal aorta. Smooth IVC contours. The portal, splenic and superior mesenteric veins are patent. Interval increase in size of the abdominopelvic lymph nodes. For reference: -gastrohepatic ligament lymph node measures 2 cm in short axis on image 50/3 previously 11 mm -left periaortic lymph node adjacent to the SMA measures 2.8 cm in short axis on image 61/3 previously 14 mm -central mesenteric lymph node measures 2.6 cm in short axis on image 80/3 previously 2 cm -left external iliac lymph node measures 17 mm in short axis on image 113/3 previously 13 mm Reproductive: Dystrophic calcifications in an enlarged prostate gland. Other: Slightly increased retroperitoneal and mesenteric stranding likely sequela of lymphatic flow restriction. Musculoskeletal: No aggressive lytic or blastic lesion of bone. IMPRESSION: 1. Interval increase in size of the thoracic and abdominopelvic lymph nodes, consistent with progression of disease. 2. Stable splenomegaly and hypodense splenic lesions. 3. Cholelithiasis without findings of acute cholecystitis. 4. Colonic diverticulosis without findings of acute diverticulitis. 5.  Aortic Atherosclerosis (ICD10-I70.0). Electronically Signed   By: Maudry Mayhew M.D.   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  His most recent imaging with CT scanning on December 28, 2022 reviewed independently and reported as above with continued progression of disease.  Patient also has a mild pancytopenia and increased fatigue.  He has agreed to weekly Rituxan x 4 for control of disease.  Will further discuss maintenance treatments after he completes the 4 weeks of Rituxan.  Return to clinic on January 13, 2023 to  initiate cycle 1 of 4.   Pancytopenia: Mild, possibly related to underlying lymphoma.  Rituxan as above.  I spent a total of 30 minutes reviewing chart data, face-to-face evaluation with the patient, counseling and coordination of care as detailed above.   Patient expressed understanding and was in agreement with this plan. He also understands that He can call clinic at any time with any questions, concerns, or complaints.    Jeralyn Ruths, MD   01/05/2023 4:03 PM

## 2023-01-05 NOTE — Progress Notes (Signed)
START ON PATHWAY REGIMEN - Lymphoma and CLL     A cycle is every 7 days:     Rituximab-xxxx   **Always confirm dose/schedule in your pharmacy ordering system**  Patient Characteristics: Follicular Lymphoma, Grades 1, 2, and 3A, First Line, Stage III / IV, Asymptomatic or Low Bulk Disease Disease Type: Follicular Lymphoma, Grade 1, 2, or 3A Disease Type: Not Applicable Disease Type: Not Applicable Line of Therapy: First Line Disease Characteristics: Asymptomatic or Low Bulk Disease Intent of Therapy: Non-Curative / Palliative Intent, Discussed with Patient

## 2023-01-06 ENCOUNTER — Other Ambulatory Visit: Payer: Self-pay

## 2023-01-07 ENCOUNTER — Encounter: Payer: Self-pay | Admitting: Oncology

## 2023-01-12 ENCOUNTER — Other Ambulatory Visit: Payer: Self-pay | Admitting: *Deleted

## 2023-01-12 ENCOUNTER — Encounter: Payer: Self-pay | Admitting: Oncology

## 2023-01-12 DIAGNOSIS — C8218 Follicular lymphoma grade II, lymph nodes of multiple sites: Secondary | ICD-10-CM

## 2023-01-13 ENCOUNTER — Ambulatory Visit: Payer: Medicare Other

## 2023-01-13 ENCOUNTER — Inpatient Hospital Stay: Payer: Medicare Other

## 2023-01-13 ENCOUNTER — Encounter: Payer: Self-pay | Admitting: Oncology

## 2023-01-13 ENCOUNTER — Inpatient Hospital Stay (HOSPITAL_BASED_OUTPATIENT_CLINIC_OR_DEPARTMENT_OTHER): Payer: Medicare Other | Admitting: Oncology

## 2023-01-13 VITALS — BP 104/60 | HR 101 | Resp 20

## 2023-01-13 VITALS — BP 124/59 | HR 88 | Temp 98.3°F | Resp 16 | Ht 68.5 in | Wt 207.0 lb

## 2023-01-13 DIAGNOSIS — C8218 Follicular lymphoma grade II, lymph nodes of multiple sites: Secondary | ICD-10-CM

## 2023-01-13 DIAGNOSIS — Z5112 Encounter for antineoplastic immunotherapy: Secondary | ICD-10-CM | POA: Diagnosis not present

## 2023-01-13 LAB — COMPREHENSIVE METABOLIC PANEL
ALT: 15 U/L (ref 0–44)
AST: 31 U/L (ref 15–41)
Albumin: 3.4 g/dL — ABNORMAL LOW (ref 3.5–5.0)
Alkaline Phosphatase: 83 U/L (ref 38–126)
Anion gap: 5 (ref 5–15)
BUN: 10 mg/dL (ref 8–23)
CO2: 25 mmol/L (ref 22–32)
Calcium: 8.4 mg/dL — ABNORMAL LOW (ref 8.9–10.3)
Chloride: 107 mmol/L (ref 98–111)
Creatinine, Ser: 0.85 mg/dL (ref 0.61–1.24)
GFR, Estimated: >60 mL/min (ref >60)
Glucose, Bld: 116 mg/dL — ABNORMAL HIGH (ref 70–99)
Potassium: 3.6 mmol/L (ref 3.5–5.1)
Sodium: 137 mmol/L (ref 135–145)
Total Bilirubin: 0.7 mg/dL (ref 0.3–1.2)
Total Protein: 5.7 g/dL — ABNORMAL LOW (ref 6.5–8.1)

## 2023-01-13 LAB — CBC WITH DIFFERENTIAL (CANCER CENTER ONLY)
Abs Immature Granulocytes: 0.01 10*3/uL (ref 0.00–0.07)
Basophils Absolute: 0 10*3/uL (ref 0.0–0.1)
Basophils Relative: 1 %
Eosinophils Absolute: 0.1 10*3/uL (ref 0.0–0.5)
Eosinophils Relative: 2 %
HCT: 32.8 % — ABNORMAL LOW (ref 39.0–52.0)
Hemoglobin: 10.6 g/dL — ABNORMAL LOW (ref 13.0–17.0)
Immature Granulocytes: 0 %
Lymphocytes Relative: 25 %
Lymphs Abs: 1.1 10*3/uL (ref 0.7–4.0)
MCH: 29.1 pg (ref 26.0–34.0)
MCHC: 32.3 g/dL (ref 30.0–36.0)
MCV: 90.1 fL (ref 80.0–100.0)
Monocytes Absolute: 0.5 10*3/uL (ref 0.1–1.0)
Monocytes Relative: 12 %
Neutro Abs: 2.6 10*3/uL (ref 1.7–7.7)
Neutrophils Relative %: 60 %
Platelet Count: 94 10*3/uL — ABNORMAL LOW (ref 150–400)
RBC: 3.64 MIL/uL — ABNORMAL LOW (ref 4.22–5.81)
RDW: 14.2 % (ref 11.5–15.5)
WBC Count: 4.3 10*3/uL (ref 4.0–10.5)
nRBC: 0 % (ref 0.0–0.2)

## 2023-01-13 LAB — HEPATITIS B SURFACE ANTIGEN: Hepatitis B Surface Ag: NONREACTIVE

## 2023-01-13 LAB — HEPATITIS B CORE ANTIBODY, TOTAL: Hep B Core Total Ab: NONREACTIVE

## 2023-01-13 MED ORDER — SODIUM CHLORIDE 0.9 % IV SOLN
Freq: Once | INTRAVENOUS | Status: AC
  Filled 2023-01-13: qty 250

## 2023-01-13 MED ORDER — SODIUM CHLORIDE 0.9 % IV SOLN
375.0000 mg/m2 | Freq: Once | INTRAVENOUS | Status: AC
  Administered 2023-01-13: 800 mg via INTRAVENOUS
  Filled 2023-01-13: qty 50
  Filled 2023-01-13: qty 80

## 2023-01-13 MED ORDER — SODIUM CHLORIDE 0.9% FLUSH
10.0000 mL | INTRAVENOUS | Status: DC | PRN
  Administered 2023-01-13: 10 mL
  Filled 2023-01-13: qty 10

## 2023-01-13 MED ORDER — ACETAMINOPHEN 325 MG PO TABS
650.0000 mg | ORAL_TABLET | Freq: Once | ORAL | Status: AC
  Administered 2023-01-13: 650 mg via ORAL
  Filled 2023-01-13: qty 2

## 2023-01-13 MED ORDER — DIPHENHYDRAMINE HCL 25 MG PO CAPS
25.0000 mg | ORAL_CAPSULE | Freq: Once | ORAL | Status: AC
  Administered 2023-01-13: 25 mg via ORAL
  Filled 2023-01-13: qty 1

## 2023-01-13 MED ORDER — MEPERIDINE HCL 25 MG/ML IJ SOLN
25.0000 mg | Freq: Once | INTRAMUSCULAR | Status: AC
  Administered 2023-01-13: 25 mg via INTRAVENOUS
  Filled 2023-01-13: qty 1

## 2023-01-13 MED ORDER — HEPARIN SOD (PORK) LOCK FLUSH 100 UNIT/ML IV SOLN
500.0000 [IU] | Freq: Once | INTRAVENOUS | Status: AC | PRN
  Administered 2023-01-13: 500 [IU]
  Filled 2023-01-13: qty 5

## 2023-01-13 MED ORDER — METHYLPREDNISOLONE SODIUM SUCC 125 MG IJ SOLR
125.0000 mg | Freq: Once | INTRAMUSCULAR | Status: AC | PRN
  Administered 2023-01-13: 125 mg via INTRAVENOUS

## 2023-01-13 MED ORDER — DIPHENHYDRAMINE HCL 50 MG/ML IJ SOLN
50.0000 mg | Freq: Once | INTRAMUSCULAR | Status: AC | PRN
  Administered 2023-01-13: 25 mg via INTRAVENOUS

## 2023-01-13 MED ORDER — FAMOTIDINE IN NACL 20-0.9 MG/50ML-% IV SOLN
20.0000 mg | Freq: Once | INTRAVENOUS | Status: AC | PRN
  Administered 2023-01-13: 20 mg via INTRAVENOUS

## 2023-01-13 NOTE — Patient Instructions (Signed)
Rituximab Injection What is this medication? RITUXIMAB (ri TUX i mab) treats leukemia and lymphoma. It works by blocking a protein that causes cancer cells to grow and multiply. This helps to slow or stop the spread of cancer cells. It may also be used to treat autoimmune conditions, such as arthritis. It works by slowing down an overactive immune system. It is a monoclonal antibody. This medicine may be used for other purposes; ask your health care provider or pharmacist if you have questions. COMMON BRAND NAME(S): RIABNI, Rituxan, RUXIENCE, truxima What should I tell my care team before I take this medication? They need to know if you have any of these conditions: Chest pain Heart disease Immune system problems Infection, such as chickenpox, cold sores, hepatitis B, herpes Irregular heartbeat or rhythm Kidney disease Low blood counts, such as low white cells, platelets, red cells Lung disease Recent or upcoming vaccine An unusual or allergic reaction to rituximab, other medications, foods, dyes, or preservatives Pregnant or trying to get pregnant Breast-feeding How should I use this medication? This medication is injected into a vein. It is given by a care team in a hospital or clinic setting. A special MedGuide will be given to you before each treatment. Be sure to read this information carefully each time. Talk to your care team about the use of this medication in children. While this medication may be prescribed for children as young as 6 months for selected conditions, precautions do apply. Overdosage: If you think you have taken too much of this medicine contact a poison control center or emergency room at once. NOTE: This medicine is only for you. Do not share this medicine with others. What if I miss a dose? Keep appointments for follow-up doses. It is important not to miss your dose. Call your care team if you are unable to keep an appointment. What may interact with this  medication? Do not take this medication with any of the following: Live vaccines This medication may also interact with the following: Cisplatin This list may not describe all possible interactions. Give your health care provider a list of all the medicines, herbs, non-prescription drugs, or dietary supplements you use. Also tell them if you smoke, drink alcohol, or use illegal drugs. Some items may interact with your medicine. What should I watch for while using this medication? Your condition will be monitored carefully while you are receiving this medication. You may need blood work while taking this medication. This medication can cause serious infusion reactions. To reduce the risk your care team may give you other medications to take before receiving this one. Be sure to follow the directions from your care team. This medication may increase your risk of getting an infection. Call your care team for advice if you get a fever, chills, sore throat, or other symptoms of a cold or flu. Do not treat yourself. Try to avoid being around people who are sick. Call your care team if you are around anyone with measles, chickenpox, or if you develop sores or blisters that do not heal properly. Avoid taking medications that contain aspirin, acetaminophen, ibuprofen, naproxen, or ketoprofen unless instructed by your care team. These medications may hide a fever. This medication may cause serious skin reactions. They can happen weeks to months after starting the medication. Contact your care team right away if you notice fevers or flu-like symptoms with a rash. The rash may be red or purple and then turn into blisters or peeling of the skin.   You may also notice a red rash with swelling of the face, lips, or lymph nodes in your neck or under your arms. In some patients, this medication may cause a serious brain infection that may cause death. If you have any problems seeing, thinking, speaking, walking, or  standing, tell your care team right away. If you cannot reach your care team, urgently seek another source of medical care. Talk to your care team if you may be pregnant. Serious birth defects can occur if you take this medication during pregnancy and for 12 months after the last dose. You will need a negative pregnancy test before starting this medication. Contraception is recommended while taking this medication and for 12 months after the last dose. Your care team can help you find the option that works for you. Do not breastfeed while taking this medication and for at least 6 months after the last dose. What side effects may I notice from receiving this medication? Side effects that you should report to your care team as soon as possible: Allergic reactions or angioedema--skin rash, itching or hives, swelling of the face, eyes, lips, tongue, arms, or legs, trouble swallowing or breathing Bowel blockage--stomach cramping, unable to have a bowel movement or pass gas, loss of appetite, vomiting Dizziness, loss of balance or coordination, confusion or trouble speaking Heart attack--pain or tightness in the chest, shoulders, arms, or jaw, nausea, shortness of breath, cold or clammy skin, feeling faint or lightheaded Heart rhythm changes--fast or irregular heartbeat, dizziness, feeling faint or lightheaded, chest pain, trouble breathing Infection--fever, chills, cough, sore throat, wounds that don't heal, pain or trouble when passing urine, general feeling of discomfort or being unwell Infusion reactions--chest pain, shortness of breath or trouble breathing, feeling faint or lightheaded Kidney injury--decrease in the amount of urine, swelling of the ankles, hands, or feet Liver injury--right upper belly pain, loss of appetite, nausea, light-colored stool, dark yellow or brown urine, yellowing skin or eyes, unusual weakness or fatigue Redness, blistering, peeling, or loosening of the skin, including  inside the mouth Stomach pain that is severe, does not go away, or gets worse Tumor lysis syndrome (TLS)--nausea, vomiting, diarrhea, decrease in the amount of urine, dark urine, unusual weakness or fatigue, confusion, muscle pain or cramps, fast or irregular heartbeat, joint pain Side effects that usually do not require medical attention (report to your care team if they continue or are bothersome): Headache Joint pain Nausea Runny or stuffy nose Unusual weakness or fatigue This list may not describe all possible side effects. Call your doctor for medical advice about side effects. You may report side effects to FDA at 1-800-FDA-1088. Where should I keep my medication? This medication is given in a hospital or clinic. It will not be stored at home. NOTE: This sheet is a summary. It may not cover all possible information. If you have questions about this medicine, talk to your doctor, pharmacist, or health care provider.  2024 [employer] Standard (2021-08-14 00:00:00)  

## 2023-01-13 NOTE — Progress Notes (Signed)
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  He currently feels well and is asymptomatic.  He does not complain of fatigue today.  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 He has no neurologic complaints.  He denies any fevers, weight loss, or night sweats.  He has noted no new lymphadenopathy.  He denies any chest pain, shortness of breath, cough, or hemoptysis.  He denies any nausea, vomiting, constipation, or diarrhea.  He has no urinary complaints.  Patient offers no further specific complaints today.  REVIEW OF SYSTEMS:   Review of Systems  Constitutional: Negative.  Negative for diaphoresis, fever, malaise/fatigue and weight loss.  Eyes:  Positive for double vision. Negative for blurred vision and pain.  Respiratory: Negative.  Negative for cough and shortness of breath.   Cardiovascular: Negative.  Negative for chest pain and leg swelling.  Gastrointestinal: Negative.  Negative for abdominal pain.  Genitourinary: Negative.  Negative for dysuria.  Musculoskeletal: Negative.  Negative for neck pain.  Skin: Negative.  Negative for rash.  Neurological: Negative.  Negative for sensory change, focal weakness, weakness and headaches.  Psychiatric/Behavioral: Negative.  The patient is not nervous/anxious.     As per HPI. Otherwise, a complete review of systems is negative.  PAST MEDICAL HISTORY: Past Medical History:  Diagnosis Date   Follicular lymphoma (HCC) 2009   Patient had chemo + rad tx's and  zevaline shots./ no flare ups since 2015   GERD (gastroesophageal reflux disease)    Heart murmur    past finding by Dr Judithann Sheen   Hemorrhoids    Hypercholesterolemia    Hypertension    started as a preventative   Seasonal allergies    Sleep apnea    CPAP   Squamous cell carcinoma of skin 02/10/2021   Left hand base of hand - [facility]   Transfusion history    Vertigo 5 yrs ago   positional vertigo    PAST SURGICAL HISTORY: Hemorrhoidectomy.  FAMILY HISTORY: Colon cancer and lung cancer.     ADVANCED DIRECTIVES:    HEALTH MAINTENANCE: Social History   Tobacco Use   Smoking status: Never   Smokeless tobacco: Never  Substance Use Topics   Alcohol use: Yes    Alcohol/week: 2.0 standard drinks of alcohol    Types: 2 Cans of beer per week    Comment: occasional   Drug use: No     Allergies  Allergen Reactions   Tape Rash    bandaids    Current Outpatient Medications  Medication Sig Dispense Refill   alfuzosin (UROXATRAL) 10 MG 24 hr tablet Take 10 mg by mouth daily.     amLODipine (NORVASC) 5 MG tablet Take 5 mg by mouth daily.      aspirin EC 81 MG tablet Take 81 mg by mouth daily.      atorvastatin (LIPITOR) 10 MG tablet Take 10 mg by mouth daily at 6 PM.      Calcium Carbonate-Vitamin D 600-400 MG-UNIT per tablet Take 1 tablet by mouth daily.      cetirizine (ZYRTEC)  10 MG tablet Take 10 mg by mouth daily.     Multiple Vitamin (MULTI-VITAMINS) TABS Take 1 tablet by mouth daily.      omeprazole (PRILOSEC) 40 MG capsule Take 40 mg by mouth daily.      tadalafil (CIALIS) 20 MG tablet Take by mouth daily as needed.      tamsulosin (FLOMAX) 0.4 MG CAPS capsule Take 1 capsule by mouth daily.     MYRBETRIQ 50 MG TB24 tablet Take 50 mg by mouth daily. (Patient not taking: Reported on 01/05/2023)     No current facility-administered medications for this visit.   Facility-Administered Medications Ordered in Other Visits  Medication Dose Route Frequency Provider Last Rate  Last Admin   heparin lock flush 100 unit/mL  500 Units Intracatheter Once PRN Jeralyn Ruths, MD       sodium chloride flush (NS) 0.9 % injection 10 mL  10 mL Intracatheter PRN Jeralyn Ruths, MD        OBJECTIVE: Vitals:   01/13/23 0851  BP: (!) 124/59  Pulse: 88  Resp: 16  Temp: 98.3 F (36.8 C)  SpO2: 98%      Body mass index is 31.02 kg/m.    ECOG FS:0 - Asymptomatic  General: Well-developed, well-nourished, no acute distress. Eyes: Pink conjunctiva, anicteric sclera. HEENT: Normocephalic, moist mucous membranes. Lungs: No audible wheezing or coughing. Heart: Regular rate and rhythm. Abdomen: Soft, nontender, no obvious distention. Musculoskeletal: No edema, cyanosis, or clubbing. Neuro: Alert, answering all questions appropriately. Cranial nerves grossly intact. Skin: No rashes or petechiae noted. Psych: Normal affect.  LAB RESULTS:  Lab Results  Component Value Date   NA 137 01/13/2023   K 3.6 01/13/2023   CL 107 01/13/2023   CO2 25 01/13/2023   GLUCOSE 116 (H) 01/13/2023   BUN 10 01/13/2023   CREATININE 0.85 01/13/2023   CALCIUM 8.4 (L) 01/13/2023   PROT 5.7 (L) 01/13/2023   ALBUMIN 3.4 (L) 01/13/2023   AST 31 01/13/2023   ALT 15 01/13/2023   ALKPHOS 83 01/13/2023   BILITOT 0.7 01/13/2023   GFRNONAA >60 01/13/2023   GFRAA >60 12/21/2019    Lab Results  Component Value Date   WBC 4.3 01/13/2023   NEUTROABS 2.6 01/13/2023   HGB 10.6 (L) 01/13/2023   HCT 32.8 (L) 01/13/2023   MCV 90.1 01/13/2023   PLT 94 (L) 01/13/2023     STUDIES: CT MAXILLOFACIAL W CONTRAST  Result Date: 01/05/2023 CLINICAL DATA:  History of follicular lymphoma in the head, face, and neck. Diagnosed in 2009 status post chemoradiation EXAM: CT MAXILLOFACIAL WITH CONTRAST; CT NECK WITH CONTRAST TECHNIQUE: Multidetector CT imaging of the neck and maxillofacial structures was performed with intravenous contrast. Multiplanar CT image reconstructions were also generated.  RADIATION DOSE REDUCTION: This exam was performed according to the departmental dose-optimization program which includes automated exposure control, adjustment of the mA and/or kV according to patient size and/or use of iterative reconstruction technique. CONTRAST:  OMNIPAQUE IOHEXOL 300 MG/ML  SOLN COMPARISON:  CT neck and maxillofacial 12/24/2021 FINDINGS: CT MAXILLOFACIAL Osseous: There is no acute osseous abnormality or suspicious osseous lesion. There is no evidence of mandibular dislocation. Orbits: Soft tissue thickening along the inferior aspect of the left globe is unchanged there is no measurable mass lesion or proptosis. Bilateral lens implants are in place. Thinning of the lacrimal glands is unchanged. Sinuses: Clear. The mastoid air cells and middle ear cavities are clear. Soft tissues: The facial soft tissues are unremarkable. See  below for findings along the aerodigestive tract and lymphadenopathy. Limited intracranial: Unremarkable. CT NECK Pharynx and larynx: Soft tissue thickening and prominence of the right adenoid tonsils is increased since the prior study (3-19). The nasal cavity and nasopharynx are otherwise unremarkable. The oral cavity and oropharynx are unremarkable. The parapharyngeal spaces are clear. The hypopharynx and larynx are unremarkable. The vocal folds are grossly unremarkable. The epiglottis is unremarkable. There is no retropharyngeal collection. The airway is patent. Salivary glands: Again seen is heterogeneity of the parotid glands. There is increased bulk of the superficial lobe on the left compared to the prior study. The submandibular glands are stable in appearance. Thyroid: Unremarkable. Lymph nodes: - A 1.1 cm left level I a node is increased in size from 0.8 cm (3-68). - A 1.8 cm x 2.4 cm x 2.5 cm nodal conglomerate in the left posterior triangle appears increased in size, previously measuring 2.3 cm x 1.1 cm x 2.2 cm (3-59). Superomedial to this nodal  conglomerate at level IIA, and 8 mm short axis node is increased in size from 6 mm (3-57). Anterior to the nodal conglomerate at level III a 9 mm node is increased in size from 8 mm (3-64). - A 2.9 cm x 2.4 cm conglomerate in the posterior triangle is increased in size from 2.5 cm x 1.8 cm (3-66). - A 1.9 cm x 3.4 cm supraclavicular node on the left is increased in size from 1.8 cm x 2.8 cm (3-75). - More laterally in the supraclavicular fossa a 1.1 cm short axis node is increased in size from 1.0 cm (3-77). - On the right, a 9 mm short axis level IIB node is increased in size from 8 mm (3-35). - A 1.3 cm x 1.5 cm level IIA node is increased in size from 1.0 cm x 1.3 cm (3-49). - A 1.7 cm x 2.3 cm right level IV node is increased in size from 1.3 cm x 1.9 cm (3-77). - Right infraclavicular lymphadenopathy overall appears increased in bulk. Vascular: The major vasculature of the neck is unremarkable. Skeleton: There is multilevel degenerative change in the cervical spine. There is no acute osseous abnormality or suspicious osseous lesion. Upper chest: Assessed on the separately dictated CT chest. Other: None. IMPRESSION: 1. Increased size and bulk of bilateral cervical lymph nodes concerning for disease progression. 2. Increased soft tissue thickening and prominence of the right adenoid tonsils, and fullness of the left parotid gland also concerning for lymphomatous involvement. 3. Unchanged soft tissue thickening along the inferior aspect of the left globe. Electronically Signed   By: Lesia Hausen M.D.   On: 01/05/2023 13:40   CT SOFT TISSUE NECK W CONTRAST  Result Date: 01/05/2023 CLINICAL DATA:  History of follicular lymphoma in the head, face, and neck. Diagnosed in 2009 status post chemoradiation EXAM: CT MAXILLOFACIAL WITH CONTRAST; CT NECK WITH CONTRAST TECHNIQUE: Multidetector CT imaging of the neck and maxillofacial structures was performed with intravenous contrast. Multiplanar CT image reconstructions  were also generated. RADIATION DOSE REDUCTION: This exam was performed according to the departmental dose-optimization program which includes automated exposure control, adjustment of the mA and/or kV according to patient size and/or use of iterative reconstruction technique. CONTRAST:  OMNIPAQUE IOHEXOL 300 MG/ML  SOLN COMPARISON:  CT neck and maxillofacial 12/24/2021 FINDINGS: CT MAXILLOFACIAL Osseous: There is no acute osseous abnormality or suspicious osseous lesion. There is no evidence of mandibular dislocation. Orbits: Soft tissue thickening along the inferior aspect of the left globe is  unchanged there is no measurable mass lesion or proptosis. Bilateral lens implants are in place. Thinning of the lacrimal glands is unchanged. Sinuses: Clear. The mastoid air cells and middle ear cavities are clear. Soft tissues: The facial soft tissues are unremarkable. See below for findings along the aerodigestive tract and lymphadenopathy. Limited intracranial: Unremarkable. CT NECK Pharynx and larynx: Soft tissue thickening and prominence of the right adenoid tonsils is increased since the prior study (3-19). The nasal cavity and nasopharynx are otherwise unremarkable. The oral cavity and oropharynx are unremarkable. The parapharyngeal spaces are clear. The hypopharynx and larynx are unremarkable. The vocal folds are grossly unremarkable. The epiglottis is unremarkable. There is no retropharyngeal collection. The airway is patent. Salivary glands: Again seen is heterogeneity of the parotid glands. There is increased bulk of the superficial lobe on the left compared to the prior study. The submandibular glands are stable in appearance. Thyroid: Unremarkable. Lymph nodes: - A 1.1 cm left level I a node is increased in size from 0.8 cm (3-68). - A 1.8 cm x 2.4 cm x 2.5 cm nodal conglomerate in the left posterior triangle appears increased in size, previously measuring 2.3 cm x 1.1 cm x 2.2 cm (3-59). Superomedial to  this nodal conglomerate at level IIA, and 8 mm short axis node is increased in size from 6 mm (3-57). Anterior to the nodal conglomerate at level III a 9 mm node is increased in size from 8 mm (3-64). - A 2.9 cm x 2.4 cm conglomerate in the posterior triangle is increased in size from 2.5 cm x 1.8 cm (3-66). - A 1.9 cm x 3.4 cm supraclavicular node on the left is increased in size from 1.8 cm x 2.8 cm (3-75). - More laterally in the supraclavicular fossa a 1.1 cm short axis node is increased in size from 1.0 cm (3-77). - On the right, a 9 mm short axis level IIB node is increased in size from 8 mm (3-35). - A 1.3 cm x 1.5 cm level IIA node is increased in size from 1.0 cm x 1.3 cm (3-49). - A 1.7 cm x 2.3 cm right level IV node is increased in size from 1.3 cm x 1.9 cm (3-77). - Right infraclavicular lymphadenopathy overall appears increased in bulk. Vascular: The major vasculature of the neck is unremarkable. Skeleton: There is multilevel degenerative change in the cervical spine. There is no acute osseous abnormality or suspicious osseous lesion. Upper chest: Assessed on the separately dictated CT chest. Other: None. IMPRESSION: 1. Increased size and bulk of bilateral cervical lymph nodes concerning for disease progression. 2. Increased soft tissue thickening and prominence of the right adenoid tonsils, and fullness of the left parotid gland also concerning for lymphomatous involvement. 3. Unchanged soft tissue thickening along the inferior aspect of the left globe. Electronically Signed   By: Lesia Hausen M.D.   On: 01/05/2023 13:40   CT CHEST ABDOMEN PELVIS W CONTRAST  Result Date: 12/28/2022 CLINICAL DATA:  Follicular lymphoma of the neck and face, restaging. * Tracking Code: BO * EXAM: CT CHEST, ABDOMEN, AND PELVIS WITH CONTRAST TECHNIQUE: Multidetector CT imaging of the chest, abdomen and pelvis was performed following the standard protocol during bolus administration of intravenous contrast. RADIATION  DOSE REDUCTION: This exam was performed according to the departmental dose-optimization program which includes automated exposure control, adjustment of the mA and/or kV according to patient size and/or use of iterative reconstruction technique. CONTRAST:  OMNIPAQUE IOHEXOL 300 MG/ML  SOLN COMPARISON:  Multiple priors including CT December 24, 2021 FINDINGS: Please refer to same day CT of the neck for findings above the clavicles. CT CHEST FINDINGS Cardiovascular: Accessed right chest Port-A-Cath with tip in the right atrium. Aortic atherosclerosis. No central pulmonary embolus on this nondedicated study. Normal size heart. No significant pericardial effusion/thickening. Mediastinum/Nodes: No suspicious thyroid nodule. Increased size of the mediastinal and bilateral axillary lymph nodes as well as the hilar nodal tissue. For reference: -left axillary lymph node measures 11 mm in short axis on image 15/3 previously 10 mm. -right axillary lymph node measures 13 mm in short axis on image 15/3 previously 9 mm. -prevascular lymph node measures 10 mm in short axis on image 15/3 previously 7 mm. The esophagus is grossly unremarkable. Lungs/Pleura: No suspicious pulmonary nodules or masses. Azygous fissure, anatomic variant. Hypoventilatory change in the dependent lungs. Chronic left lower lobe atelectasis. Musculoskeletal: No aggressive lytic or blastic lesion of bone. CT ABDOMEN PELVIS FINDINGS Hepatobiliary: Stable hypodense lesion in segment V on image 61/3 likely reflecting a benign finding. No new suspicious hepatic lesion. Cholelithiasis without findings of acute cholecystitis. No biliary ductal dilation. Pancreas: No pancreatic ductal dilation or evidence of acute inflammation. Spleen: Stable small splenic hypodensities. Splenomegaly measuring 19.9 cm in maximum axial dimension, unchanged. Adrenals/Urinary Tract: Bilateral adrenal nodules are previously reported is stable dating back to 2013, compatible with  adenomas. No hydronephrosis. Kidneys demonstrate symmetric enhancement. Similar mild wall thickening of a nondistended urinary bladder. Stomach/Bowel: Radiopaque enteric contrast material traverses the rectum. Stomach is unremarkable for degree of distension. No pathologic dilation of small or large bowel. No evidence of acute bowel inflammation. Colonic diverticulosis without findings of acute diverticulitis. Vascular/Lymphatic: Normal caliber abdominal aorta. Smooth IVC contours. The portal, splenic and superior mesenteric veins are patent. Interval increase in size of the abdominopelvic lymph nodes. For reference: -gastrohepatic ligament lymph node measures 2 cm in short axis on image 50/3 previously 11 mm -left periaortic lymph node adjacent to the SMA measures 2.8 cm in short axis on image 61/3 previously 14 mm -central mesenteric lymph node measures 2.6 cm in short axis on image 80/3 previously 2 cm -left external iliac lymph node measures 17 mm in short axis on image 113/3 previously 13 mm Reproductive: Dystrophic calcifications in an enlarged prostate gland. Other: Slightly increased retroperitoneal and mesenteric stranding likely sequela of lymphatic flow restriction. Musculoskeletal: No aggressive lytic or blastic lesion of bone. IMPRESSION: 1. Interval increase in size of the thoracic and abdominopelvic lymph nodes, consistent with progression of disease. 2. Stable splenomegaly and hypodense splenic lesions. 3. Cholelithiasis without findings of acute cholecystitis. 4. Colonic diverticulosis without findings of acute diverticulitis. 5.  Aortic Atherosclerosis (ICD10-I70.0). Electronically Signed   By: Maudry Mayhew M.D.   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  His most recent imaging with CT scanning on  December 28, 2022 reviewed independently and reported as above with continued progression of disease.  Patient also has a mild pancytopenia and increased fatigue.  He has agreed to weekly Rituxan x 4 for control of disease.  Will further discuss maintenance treatments after he completes the 4 weeks of Rituxan.  Proceed with cycle 1 of treatment today.  Return to clinic in 1 week for further evaluation and consideration of cycle 2.   Pancytopenia: White blood  cell count has now normalized.  Hemoglobin and platelets are low, but stable.  Rituxan as above. Reaction to Rituxan: Likely rate based.  Increased premeds have been added for the remainder of his treatments.  I spent a total of 30 minutes reviewing chart data, face-to-face evaluation with the patient, counseling and coordination of care as detailed above.    Patient expressed understanding and was in agreement with this plan. He also understands that He can call clinic at any time with any questions, concerns, or complaints.    Jeralyn Ruths, MD   01/13/2023 2:13 PM

## 2023-01-13 NOTE — Progress Notes (Signed)
Hypersensitivity Reaction note  Date of event: 01/13/23 Time of event: 1204 Generic name of drug involved: [id number] (RUXIENCE) Name of provider notified of the hypersensitivity reaction: Orlie Dakin, MD, [place], NP. Rigors (Chills and Shakes) Was agent that likely caused hypersensitivity reaction added to Allergies List within EMR? Yes Chain of events including reaction signs/symptoms, treatment administered, and outcome (e.g., drug resumed; drug discontinued; sent to Emergency Department; etc.) No  1204: patient had chills. Warm blankets given to patient. (Patient was on the third titration) BP 153/97, HR 142, R 20, 96% RA  1206- IVFs started at 247ml/hr per NP. NP at chairside. S/o chest tightness, Cough was normal. Had a cough prior to.   1210- Demoral 25mg  IV given per NP. Patient stated feeling "Better"  1217- BP 132/101 and still having rigors but decreased. Benadryl 25mg  IV given.  1218- Patient feeling much better.  1220- Pepcid 20mg  started wide open.  1224- BP 110/96 HR 121, 100% RA still some rigors.  1226- Patient stated "feeling better". Still some rigors.  1228- 97% RA, HR 117, BP 126/73  1245- NP stated at chairside to keep monitoring and let him know how he was doing in 15 mins.  1300- Solumedrol 125mg  given per NP came to chairside. Also, to restart treatment in 15 mins at the 2nd titration.   1315- Patient sleeping. BP 112/56, HR 109, R 24, 94% RA.  1320- Treatment started again.  Patient had no other symptoms or concerns with the remainder of his treatment. Wife was at chairside during his treatment.   1439: Patient has completed treatment and discharged home.  Leodis Sias, RN 01/13/2023 4:36 PM

## 2023-01-15 ENCOUNTER — Telehealth: Payer: Self-pay

## 2023-01-15 ENCOUNTER — Inpatient Hospital Stay: Payer: Medicare Other

## 2023-01-15 NOTE — Telephone Encounter (Signed)
Called Mr. Southwell to f/u to see how he was doing. Patient stated he was weak today. He felt better the other day and this morning. He is eating and drinking with no problems. He also talked with his PCP and had labs drawn this morning. He has a f/u with his PCP on Thursday to discuss his labs. His next treatment is on Wednesday. Discussed to call the cancer center number if he had any questions or concerns or if anything changes. Patient was grateful for the call.

## 2023-01-17 ENCOUNTER — Encounter: Payer: Self-pay | Admitting: Oncology

## 2023-01-19 MED FILL — Dexamethasone Sodium Phosphate Inj 100 MG/10ML: INTRAMUSCULAR | Qty: 2 | Status: AC

## 2023-01-20 ENCOUNTER — Encounter: Payer: Self-pay | Admitting: Oncology

## 2023-01-20 ENCOUNTER — Other Ambulatory Visit: Payer: Medicare Other

## 2023-01-20 ENCOUNTER — Inpatient Hospital Stay (HOSPITAL_BASED_OUTPATIENT_CLINIC_OR_DEPARTMENT_OTHER): Payer: Medicare Other | Admitting: Oncology

## 2023-01-20 ENCOUNTER — Ambulatory Visit: Payer: Medicare Other

## 2023-01-20 ENCOUNTER — Inpatient Hospital Stay: Payer: Medicare Other

## 2023-01-20 VITALS — BP 131/54 | HR 98 | Temp 97.5°F | Resp 16 | Ht 68.5 in | Wt 208.0 lb

## 2023-01-20 VITALS — BP 138/67 | HR 86 | Temp 97.1°F | Resp 18

## 2023-01-20 DIAGNOSIS — C8218 Follicular lymphoma grade II, lymph nodes of multiple sites: Secondary | ICD-10-CM | POA: Diagnosis not present

## 2023-01-20 DIAGNOSIS — Z95828 Presence of other vascular implants and grafts: Secondary | ICD-10-CM

## 2023-01-20 DIAGNOSIS — Z5112 Encounter for antineoplastic immunotherapy: Secondary | ICD-10-CM | POA: Diagnosis not present

## 2023-01-20 MED ORDER — SODIUM CHLORIDE 0.9 % IV SOLN
20.0000 mg | Freq: Once | INTRAVENOUS | Status: AC
  Administered 2023-01-20: 20 mg via INTRAVENOUS
  Filled 2023-01-20: qty 20

## 2023-01-20 MED ORDER — HEPARIN SOD (PORK) LOCK FLUSH 100 UNIT/ML IV SOLN
500.0000 [IU] | Freq: Once | INTRAVENOUS | Status: AC
  Administered 2023-01-20: 500 [IU] via INTRAVENOUS
  Filled 2023-01-20: qty 5

## 2023-01-20 MED ORDER — DIPHENHYDRAMINE HCL 25 MG PO CAPS
50.0000 mg | ORAL_CAPSULE | Freq: Once | ORAL | Status: AC
  Administered 2023-01-20: 50 mg via ORAL
  Filled 2023-01-20: qty 2

## 2023-01-20 MED ORDER — FAMOTIDINE IN NACL 20-0.9 MG/50ML-% IV SOLN
20.0000 mg | Freq: Once | INTRAVENOUS | Status: AC
  Administered 2023-01-20: 20 mg via INTRAVENOUS
  Filled 2023-01-20: qty 50

## 2023-01-20 MED ORDER — SODIUM CHLORIDE 0.9 % IV SOLN
375.0000 mg/m2 | Freq: Once | INTRAVENOUS | Status: AC
  Administered 2023-01-20: 800 mg via INTRAVENOUS
  Filled 2023-01-20: qty 50

## 2023-01-20 MED ORDER — SODIUM CHLORIDE 0.9 % IV SOLN
Freq: Once | INTRAVENOUS | Status: AC
  Filled 2023-01-20: qty 250

## 2023-01-20 MED ORDER — ACETAMINOPHEN 325 MG PO TABS
650.0000 mg | ORAL_TABLET | Freq: Once | ORAL | Status: AC
  Administered 2023-01-20: 650 mg via ORAL
  Filled 2023-01-20: qty 2

## 2023-01-20 NOTE — Progress Notes (Unsigned)
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  He had a mild reaction to his first infusion, but ultimately was able to complete his treatment.  He had increased fatigue earlier this week, but this has resolved and he feels back to his baseline.  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 He has no neurologic complaints.  He denies any fevers, weight loss, or night sweats.  He has noted no new lymphadenopathy.  He denies any chest pain, shortness of breath, cough, or hemoptysis.  He denies any nausea, vomiting, constipation, or diarrhea.  He has no urinary complaints.  Patient offers no further specific complaints today.  REVIEW OF SYSTEMS:   Review of Systems  Constitutional: Negative.  Negative for diaphoresis, fever, malaise/fatigue and weight loss.  Eyes:  Positive for double vision. Negative for blurred vision and pain.  Respiratory: Negative.  Negative for cough and shortness of breath.   Cardiovascular: Negative.  Negative for chest pain and leg swelling.  Gastrointestinal: Negative.  Negative for abdominal pain.  Genitourinary: Negative.  Negative for dysuria.  Musculoskeletal: Negative.  Negative for neck pain.  Skin: Negative.  Negative for rash.  Neurological: Negative.  Negative for sensory change, focal weakness, weakness and headaches.  Psychiatric/Behavioral: Negative.  The patient is not nervous/anxious.     As per HPI. Otherwise, a complete review of systems is negative.  PAST MEDICAL  HISTORY: Past Medical History:  Diagnosis Date   Follicular lymphoma (HCC) 2009   Patient had chemo + rad tx's and zevaline shots./ no flare ups since 2015   GERD (gastroesophageal reflux disease)    Heart murmur    past finding by Dr Judithann Sheen   Hemorrhoids    Hypercholesterolemia    Hypertension    started as a preventative   Seasonal allergies    Sleep apnea    CPAP   Squamous cell carcinoma of skin 02/10/2021   Left hand base of hand - [facility]   Transfusion history    Vertigo 5 yrs ago   positional vertigo    PAST SURGICAL HISTORY: Hemorrhoidectomy.  FAMILY HISTORY: Colon cancer and lung cancer.     ADVANCED DIRECTIVES:    HEALTH MAINTENANCE: Social History   Tobacco Use   Smoking status: Never   Smokeless tobacco: Never  Substance Use Topics   Alcohol use: Yes    Alcohol/week: 2.0 standard drinks of alcohol    Types: 2 Cans of beer per week    Comment: occasional   Drug use: No     Allergies  Allergen Reactions   Rituximab-Pvvr Other (See Comments)    Patient had a hypersensitivity to Rituximab. See progress note on 01/13/23. Patient able to complete infusion.   Tape Rash    bandaids    Current Outpatient Medications  Medication Sig Dispense Refill   alfuzosin (UROXATRAL) 10 MG 24 hr tablet Take 10 mg by mouth daily.     amLODipine (NORVASC) 5 MG tablet Take 5 mg by mouth daily.      aspirin EC 81 MG tablet Take 81 mg by  mouth daily.      atorvastatin (LIPITOR) 10 MG tablet Take 10 mg by mouth daily at 6 PM.      Calcium Carbonate-Vitamin D 600-400 MG-UNIT per tablet Take 1 tablet by mouth daily.      cetirizine (ZYRTEC) 10 MG tablet Take 10 mg by mouth daily.     Multiple Vitamin (MULTI-VITAMINS) TABS Take 1 tablet by mouth daily.      omeprazole (PRILOSEC) 40 MG capsule Take 40 mg by mouth daily.      tadalafil (CIALIS) 20 MG tablet Take by mouth daily as needed.      tamsulosin (FLOMAX) 0.4 MG CAPS capsule Take 1 capsule by mouth daily. (Patient not  taking: Reported on 01/20/2023)     No current facility-administered medications for this visit.    OBJECTIVE: Vitals:   01/20/23 0937  BP: (!) 131/54  Pulse: 98  Resp: 16  Temp: (!) 97.5 F (36.4 C)  SpO2: 96%       Body mass index is 31.17 kg/m.    ECOG FS:0 - Asymptomatic  General: Well-developed, well-nourished, no acute distress. Eyes: Pink conjunctiva, anicteric sclera. HEENT: Normocephalic, moist mucous membranes. Lungs: No audible wheezing or coughing. Heart: Regular rate and rhythm. Abdomen: Soft, nontender, no obvious distention. Musculoskeletal: No edema, cyanosis, or clubbing. Neuro: Alert, answering all questions appropriately. Cranial nerves grossly intact. Skin: No rashes or petechiae noted. Psych: Normal affect.  LAB RESULTS:  Lab Results  Component Value Date   NA 137 01/13/2023   K 3.6 01/13/2023   CL 107 01/13/2023   CO2 25 01/13/2023   GLUCOSE 116 (H) 01/13/2023   BUN 10 01/13/2023   CREATININE 0.85 01/13/2023   CALCIUM 8.4 (L) 01/13/2023   PROT 5.7 (L) 01/13/2023   ALBUMIN 3.4 (L) 01/13/2023   AST 31 01/13/2023   ALT 15 01/13/2023   ALKPHOS 83 01/13/2023   BILITOT 0.7 01/13/2023   GFRNONAA >60 01/13/2023   GFRAA >60 12/21/2019    Lab Results  Component Value Date   WBC 4.3 01/13/2023   NEUTROABS 2.6 01/13/2023   HGB 10.6 (L) 01/13/2023   HCT 32.8 (L) 01/13/2023   MCV 90.1 01/13/2023   PLT 94 (L) 01/13/2023     STUDIES: CT MAXILLOFACIAL W CONTRAST  Result Date: 01/05/2023 CLINICAL DATA:  History of follicular lymphoma in the head, face, and neck. Diagnosed in 2009 status post chemoradiation EXAM: CT MAXILLOFACIAL WITH CONTRAST; CT NECK WITH CONTRAST TECHNIQUE: Multidetector CT imaging of the neck and maxillofacial structures was performed with intravenous contrast. Multiplanar CT image reconstructions were also generated. RADIATION DOSE REDUCTION: This exam was performed according to the departmental dose-optimization program which  includes automated exposure control, adjustment of the mA and/or kV according to patient size and/or use of iterative reconstruction technique. CONTRAST:  OMNIPAQUE IOHEXOL 300 MG/ML  SOLN COMPARISON:  CT neck and maxillofacial 12/24/2021 FINDINGS: CT MAXILLOFACIAL Osseous: There is no acute osseous abnormality or suspicious osseous lesion. There is no evidence of mandibular dislocation. Orbits: Soft tissue thickening along the inferior aspect of the left globe is unchanged there is no measurable mass lesion or proptosis. Bilateral lens implants are in place. Thinning of the lacrimal glands is unchanged. Sinuses: Clear. The mastoid air cells and middle ear cavities are clear. Soft tissues: The facial soft tissues are unremarkable. See below for findings along the aerodigestive tract and lymphadenopathy. Limited intracranial: Unremarkable. CT NECK Pharynx and larynx: Soft tissue thickening and prominence of the right adenoid tonsils is increased since  the prior study (3-19). The nasal cavity and nasopharynx are otherwise unremarkable. The oral cavity and oropharynx are unremarkable. The parapharyngeal spaces are clear. The hypopharynx and larynx are unremarkable. The vocal folds are grossly unremarkable. The epiglottis is unremarkable. There is no retropharyngeal collection. The airway is patent. Salivary glands: Again seen is heterogeneity of the parotid glands. There is increased bulk of the superficial lobe on the left compared to the prior study. The submandibular glands are stable in appearance. Thyroid: Unremarkable. Lymph nodes: - A 1.1 cm left level I a node is increased in size from 0.8 cm (3-68). - A 1.8 cm x 2.4 cm x 2.5 cm nodal conglomerate in the left posterior triangle appears increased in size, previously measuring 2.3 cm x 1.1 cm x 2.2 cm (3-59). Superomedial to this nodal conglomerate at level IIA, and 8 mm short axis node is increased in size from 6 mm (3-57). Anterior to the nodal  conglomerate at level III a 9 mm node is increased in size from 8 mm (3-64). - A 2.9 cm x 2.4 cm conglomerate in the posterior triangle is increased in size from 2.5 cm x 1.8 cm (3-66). - A 1.9 cm x 3.4 cm supraclavicular node on the left is increased in size from 1.8 cm x 2.8 cm (3-75). - More laterally in the supraclavicular fossa a 1.1 cm short axis node is increased in size from 1.0 cm (3-77). - On the right, a 9 mm short axis level IIB node is increased in size from 8 mm (3-35). - A 1.3 cm x 1.5 cm level IIA node is increased in size from 1.0 cm x 1.3 cm (3-49). - A 1.7 cm x 2.3 cm right level IV node is increased in size from 1.3 cm x 1.9 cm (3-77). - Right infraclavicular lymphadenopathy overall appears increased in bulk. Vascular: The major vasculature of the neck is unremarkable. Skeleton: There is multilevel degenerative change in the cervical spine. There is no acute osseous abnormality or suspicious osseous lesion. Upper chest: Assessed on the separately dictated CT chest. Other: None. IMPRESSION: 1. Increased size and bulk of bilateral cervical lymph nodes concerning for disease progression. 2. Increased soft tissue thickening and prominence of the right adenoid tonsils, and fullness of the left parotid gland also concerning for lymphomatous involvement. 3. Unchanged soft tissue thickening along the inferior aspect of the left globe. Electronically Signed   By: Lesia Hausen M.D.   On: 01/05/2023 13:40   CT SOFT TISSUE NECK W CONTRAST  Result Date: 01/05/2023 CLINICAL DATA:  History of follicular lymphoma in the head, face, and neck. Diagnosed in 2009 status post chemoradiation EXAM: CT MAXILLOFACIAL WITH CONTRAST; CT NECK WITH CONTRAST TECHNIQUE: Multidetector CT imaging of the neck and maxillofacial structures was performed with intravenous contrast. Multiplanar CT image reconstructions were also generated. RADIATION DOSE REDUCTION: This exam was performed according to the departmental  dose-optimization program which includes automated exposure control, adjustment of the mA and/or kV according to patient size and/or use of iterative reconstruction technique. CONTRAST:  OMNIPAQUE IOHEXOL 300 MG/ML  SOLN COMPARISON:  CT neck and maxillofacial 12/24/2021 FINDINGS: CT MAXILLOFACIAL Osseous: There is no acute osseous abnormality or suspicious osseous lesion. There is no evidence of mandibular dislocation. Orbits: Soft tissue thickening along the inferior aspect of the left globe is unchanged there is no measurable mass lesion or proptosis. Bilateral lens implants are in place. Thinning of the lacrimal glands is unchanged. Sinuses: Clear. The mastoid air cells and middle  ear cavities are clear. Soft tissues: The facial soft tissues are unremarkable. See below for findings along the aerodigestive tract and lymphadenopathy. Limited intracranial: Unremarkable. CT NECK Pharynx and larynx: Soft tissue thickening and prominence of the right adenoid tonsils is increased since the prior study (3-19). The nasal cavity and nasopharynx are otherwise unremarkable. The oral cavity and oropharynx are unremarkable. The parapharyngeal spaces are clear. The hypopharynx and larynx are unremarkable. The vocal folds are grossly unremarkable. The epiglottis is unremarkable. There is no retropharyngeal collection. The airway is patent. Salivary glands: Again seen is heterogeneity of the parotid glands. There is increased bulk of the superficial lobe on the left compared to the prior study. The submandibular glands are stable in appearance. Thyroid: Unremarkable. Lymph nodes: - A 1.1 cm left level I a node is increased in size from 0.8 cm (3-68). - A 1.8 cm x 2.4 cm x 2.5 cm nodal conglomerate in the left posterior triangle appears increased in size, previously measuring 2.3 cm x 1.1 cm x 2.2 cm (3-59). Superomedial to this nodal conglomerate at level IIA, and 8 mm short axis node is increased in size from 6 mm (3-57).  Anterior to the nodal conglomerate at level III a 9 mm node is increased in size from 8 mm (3-64). - A 2.9 cm x 2.4 cm conglomerate in the posterior triangle is increased in size from 2.5 cm x 1.8 cm (3-66). - A 1.9 cm x 3.4 cm supraclavicular node on the left is increased in size from 1.8 cm x 2.8 cm (3-75). - More laterally in the supraclavicular fossa a 1.1 cm short axis node is increased in size from 1.0 cm (3-77). - On the right, a 9 mm short axis level IIB node is increased in size from 8 mm (3-35). - A 1.3 cm x 1.5 cm level IIA node is increased in size from 1.0 cm x 1.3 cm (3-49). - A 1.7 cm x 2.3 cm right level IV node is increased in size from 1.3 cm x 1.9 cm (3-77). - Right infraclavicular lymphadenopathy overall appears increased in bulk. Vascular: The major vasculature of the neck is unremarkable. Skeleton: There is multilevel degenerative change in the cervical spine. There is no acute osseous abnormality or suspicious osseous lesion. Upper chest: Assessed on the separately dictated CT chest. Other: None. IMPRESSION: 1. Increased size and bulk of bilateral cervical lymph nodes concerning for disease progression. 2. Increased soft tissue thickening and prominence of the right adenoid tonsils, and fullness of the left parotid gland also concerning for lymphomatous involvement. 3. Unchanged soft tissue thickening along the inferior aspect of the left globe. Electronically Signed   By: Lesia Hausen M.D.   On: 01/05/2023 13:40   CT CHEST ABDOMEN PELVIS W CONTRAST  Result Date: 12/28/2022 CLINICAL DATA:  Follicular lymphoma of the neck and face, restaging. * Tracking Code: BO * EXAM: CT CHEST, ABDOMEN, AND PELVIS WITH CONTRAST TECHNIQUE: Multidetector CT imaging of the chest, abdomen and pelvis was performed following the standard protocol during bolus administration of intravenous contrast. RADIATION DOSE REDUCTION: This exam was performed according to the departmental dose-optimization program which  includes automated exposure control, adjustment of the mA and/or kV according to patient size and/or use of iterative reconstruction technique. CONTRAST:  OMNIPAQUE IOHEXOL 300 MG/ML  SOLN COMPARISON:  Multiple priors including CT December 24, 2021 FINDINGS: Please refer to same day CT of the neck for findings above the clavicles. CT CHEST FINDINGS Cardiovascular: Accessed right chest Port-A-Cath  with tip in the right atrium. Aortic atherosclerosis. No central pulmonary embolus on this nondedicated study. Normal size heart. No significant pericardial effusion/thickening. Mediastinum/Nodes: No suspicious thyroid nodule. Increased size of the mediastinal and bilateral axillary lymph nodes as well as the hilar nodal tissue. For reference: -left axillary lymph node measures 11 mm in short axis on image 15/3 previously 10 mm. -right axillary lymph node measures 13 mm in short axis on image 15/3 previously 9 mm. -prevascular lymph node measures 10 mm in short axis on image 15/3 previously 7 mm. The esophagus is grossly unremarkable. Lungs/Pleura: No suspicious pulmonary nodules or masses. Azygous fissure, anatomic variant. Hypoventilatory change in the dependent lungs. Chronic left lower lobe atelectasis. Musculoskeletal: No aggressive lytic or blastic lesion of bone. CT ABDOMEN PELVIS FINDINGS Hepatobiliary: Stable hypodense lesion in segment V on image 61/3 likely reflecting a benign finding. No new suspicious hepatic lesion. Cholelithiasis without findings of acute cholecystitis. No biliary ductal dilation. Pancreas: No pancreatic ductal dilation or evidence of acute inflammation. Spleen: Stable small splenic hypodensities. Splenomegaly measuring 19.9 cm in maximum axial dimension, unchanged. Adrenals/Urinary Tract: Bilateral adrenal nodules are previously reported is stable dating back to 2013, compatible with adenomas. No hydronephrosis. Kidneys demonstrate symmetric enhancement. Similar mild wall thickening  of a nondistended urinary bladder. Stomach/Bowel: Radiopaque enteric contrast material traverses the rectum. Stomach is unremarkable for degree of distension. No pathologic dilation of small or large bowel. No evidence of acute bowel inflammation. Colonic diverticulosis without findings of acute diverticulitis. Vascular/Lymphatic: Normal caliber abdominal aorta. Smooth IVC contours. The portal, splenic and superior mesenteric veins are patent. Interval increase in size of the abdominopelvic lymph nodes. For reference: -gastrohepatic ligament lymph node measures 2 cm in short axis on image 50/3 previously 11 mm -left periaortic lymph node adjacent to the SMA measures 2.8 cm in short axis on image 61/3 previously 14 mm -central mesenteric lymph node measures 2.6 cm in short axis on image 80/3 previously 2 cm -left external iliac lymph node measures 17 mm in short axis on image 113/3 previously 13 mm Reproductive: Dystrophic calcifications in an enlarged prostate gland. Other: Slightly increased retroperitoneal and mesenteric stranding likely sequela of lymphatic flow restriction. Musculoskeletal: No aggressive lytic or blastic lesion of bone. IMPRESSION: 1. Interval increase in size of the thoracic and abdominopelvic lymph nodes, consistent with progression of disease. 2. Stable splenomegaly and hypodense splenic lesions. 3. Cholelithiasis without findings of acute cholecystitis. 4. Colonic diverticulosis without findings of acute diverticulitis. 5.  Aortic Atherosclerosis (ICD10-I70.0). Electronically Signed   By: Maudry Mayhew M.D.   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  His most recent imaging with CT scanning on December 28, 2022 reviewed independently and reported as above with continued progression of disease.   Patient also has a mild pancytopenia and increased fatigue.  He has agreed to weekly Rituxan x 4 for control of disease.  Will further discuss maintenance treatments after he completes the 4 weeks of Rituxan.  Proceed with cycle 2 of treatment today.  Return to clinic in 1 week for further evaluation and consideration of cycle 3.   Anemia: Patient's hemoglobin is trended down slightly to 10.6.  Proceed with Rituxan as above.  Will get iron stores with next lab draw. Thrombocytopenia: Patient's platelet count is trended down to 94.  Monitor. Reaction to Rituxan: Likely rate based.  Additional premeds have been added for the remainder of his treatments.   Patient expressed understanding and was in agreement with this plan. He also understands that He can call clinic at any time with any questions, concerns, or complaints.    Jeralyn Ruths, MD   01/21/2023 8:55 AM

## 2023-01-20 NOTE — Progress Notes (Unsigned)
Concerned about hemoglobin and RBC dropping in lab work.

## 2023-01-21 ENCOUNTER — Encounter: Payer: Self-pay | Admitting: Oncology

## 2023-01-26 MED FILL — Dexamethasone Sodium Phosphate Inj 100 MG/10ML: INTRAMUSCULAR | Qty: 2 | Status: AC

## 2023-01-27 ENCOUNTER — Inpatient Hospital Stay (HOSPITAL_BASED_OUTPATIENT_CLINIC_OR_DEPARTMENT_OTHER): Payer: Medicare Other | Admitting: Oncology

## 2023-01-27 ENCOUNTER — Encounter: Payer: Self-pay | Admitting: Oncology

## 2023-01-27 ENCOUNTER — Inpatient Hospital Stay: Payer: Medicare Other

## 2023-01-27 ENCOUNTER — Ambulatory Visit: Payer: Medicare Other

## 2023-01-27 VITALS — BP 110/65 | HR 61 | Temp 97.0°F | Resp 17

## 2023-01-27 VITALS — BP 123/67 | HR 91 | Temp 98.1°F | Resp 16 | Ht 68.5 in | Wt 206.0 lb

## 2023-01-27 DIAGNOSIS — C8218 Follicular lymphoma grade II, lymph nodes of multiple sites: Secondary | ICD-10-CM | POA: Diagnosis not present

## 2023-01-27 DIAGNOSIS — Z5112 Encounter for antineoplastic immunotherapy: Secondary | ICD-10-CM | POA: Diagnosis not present

## 2023-01-27 LAB — CBC WITH DIFFERENTIAL (CANCER CENTER ONLY)
Abs Immature Granulocytes: 0.05 10*3/uL (ref 0.00–0.07)
Basophils Absolute: 0.1 10*3/uL (ref 0.0–0.1)
Basophils Relative: 1 %
Eosinophils Absolute: 0.1 10*3/uL (ref 0.0–0.5)
Eosinophils Relative: 2 %
HCT: 33.6 % — ABNORMAL LOW (ref 39.0–52.0)
Hemoglobin: 11 g/dL — ABNORMAL LOW (ref 13.0–17.0)
Immature Granulocytes: 1 %
Lymphocytes Relative: 15 %
Lymphs Abs: 1.1 10*3/uL (ref 0.7–4.0)
MCH: 28.9 pg (ref 26.0–34.0)
MCHC: 32.7 g/dL (ref 30.0–36.0)
MCV: 88.4 fL (ref 80.0–100.0)
Monocytes Absolute: 0.8 10*3/uL (ref 0.1–1.0)
Monocytes Relative: 11 %
Neutro Abs: 5 10*3/uL (ref 1.7–7.7)
Neutrophils Relative %: 70 %
Platelet Count: 139 10*3/uL — ABNORMAL LOW (ref 150–400)
RBC: 3.8 MIL/uL — ABNORMAL LOW (ref 4.22–5.81)
RDW: 14.1 % (ref 11.5–15.5)
WBC Count: 7.1 10*3/uL (ref 4.0–10.5)
nRBC: 0 % (ref 0.0–0.2)

## 2023-01-27 LAB — COMPREHENSIVE METABOLIC PANEL
ALT: 12 U/L (ref 0–44)
AST: 23 U/L (ref 15–41)
Albumin: 3.4 g/dL — ABNORMAL LOW (ref 3.5–5.0)
Alkaline Phosphatase: 74 U/L (ref 38–126)
Anion gap: 6 (ref 5–15)
BUN: 9 mg/dL (ref 8–23)
CO2: 24 mmol/L (ref 22–32)
Calcium: 7.9 mg/dL — ABNORMAL LOW (ref 8.9–10.3)
Chloride: 105 mmol/L (ref 98–111)
Creatinine, Ser: 0.97 mg/dL (ref 0.61–1.24)
GFR, Estimated: >60 mL/min (ref >60)
Glucose, Bld: 111 mg/dL — ABNORMAL HIGH (ref 70–99)
Potassium: 3.3 mmol/L — ABNORMAL LOW (ref 3.5–5.1)
Sodium: 135 mmol/L (ref 135–145)
Total Bilirubin: 0.7 mg/dL (ref 0.3–1.2)
Total Protein: 5.4 g/dL — ABNORMAL LOW (ref 6.5–8.1)

## 2023-01-27 LAB — IRON AND TIBC
Iron: 38 ug/dL — ABNORMAL LOW (ref 45–182)
Saturation Ratios: 12 % — ABNORMAL LOW (ref 17.9–39.5)
TIBC: 330 ug/dL (ref 250–450)
UIBC: 292 ug/dL

## 2023-01-27 LAB — FERRITIN: Ferritin: 44 ng/mL (ref 24–336)

## 2023-01-27 MED ORDER — SODIUM CHLORIDE 0.9 % IV SOLN
375.0000 mg/m2 | Freq: Once | INTRAVENOUS | Status: AC
  Administered 2023-01-27: 800 mg via INTRAVENOUS
  Filled 2023-01-27: qty 50

## 2023-01-27 MED ORDER — FAMOTIDINE IN NACL 20-0.9 MG/50ML-% IV SOLN
20.0000 mg | Freq: Once | INTRAVENOUS | Status: AC
  Administered 2023-01-27: 20 mg via INTRAVENOUS
  Filled 2023-01-27: qty 50

## 2023-01-27 MED ORDER — SODIUM CHLORIDE 0.9 % IV SOLN
20.0000 mg | Freq: Once | INTRAVENOUS | Status: AC
  Administered 2023-01-27: 20 mg via INTRAVENOUS
  Filled 2023-01-27: qty 20

## 2023-01-27 MED ORDER — SODIUM CHLORIDE 0.9% FLUSH
10.0000 mL | INTRAVENOUS | Status: DC | PRN
  Administered 2023-01-27: 10 mL
  Filled 2023-01-27: qty 10

## 2023-01-27 MED ORDER — SODIUM CHLORIDE 0.9 % IV SOLN
Freq: Once | INTRAVENOUS | Status: AC
  Filled 2023-01-27: qty 250

## 2023-01-27 MED ORDER — HEPARIN SOD (PORK) LOCK FLUSH 100 UNIT/ML IV SOLN
500.0000 [IU] | Freq: Once | INTRAVENOUS | Status: AC | PRN
  Administered 2023-01-27: 500 [IU]
  Filled 2023-01-27: qty 5

## 2023-01-27 MED ORDER — DIPHENHYDRAMINE HCL 25 MG PO CAPS
50.0000 mg | ORAL_CAPSULE | Freq: Once | ORAL | Status: AC
  Administered 2023-01-27: 50 mg via ORAL
  Filled 2023-01-27: qty 2

## 2023-01-27 MED ORDER — ACETAMINOPHEN 325 MG PO TABS
650.0000 mg | ORAL_TABLET | Freq: Once | ORAL | Status: AC
  Administered 2023-01-27: 650 mg via ORAL
  Filled 2023-01-27: qty 2

## 2023-01-27 NOTE — Progress Notes (Signed)
Ok'd by Chiropodist to go by 100 mh/hr dosing

## 2023-01-27 NOTE — Patient Instructions (Signed)
Rituximab What is this medication? RITUXIMAB (ri TUX i mab) treats leukemia and lymphoma. It works by blocking a protein that causes cancer cells to grow and multiply. This helps to slow or stop the spread of cancer cells. It may also be used to treat autoimmune conditions, such as arthritis. It works by slowing down an overactive immune system. It is a monoclonal antibody. This medicine may be used for other purposes; ask your health care provider or pharmacist if you have questions. COMMON BRAND NAME(S): RIABNI, Rituxan, RUXIENCE, truxima What should I tell my care team before I take this medication? They need to know if you have any of these conditions: Chest pain Heart disease Immune system problems Infection, such as chickenpox, cold sores, hepatitis B, herpes Irregular heartbeat or rhythm Kidney disease Low blood counts, such as low white cells, platelets, red cells Lung disease Recent or upcoming vaccine An unusual or allergic reaction to rituximab, other medications, foods, dyes, or preservatives Pregnant or trying to get pregnant Breast-feeding How should I use this medication? This medication is injected into a vein. It is given by a care team in a hospital or clinic setting. A special MedGuide will be given to you before each treatment. Be sure to read this information carefully each time. Talk to your care team about the use of this medication in children. While this medication may be prescribed for children as young as 6 months for selected conditions, precautions do apply. Overdosage: If you think you have taken too much of this medicine contact a poison control center or emergency room at once. NOTE: This medicine is only for you. Do not share this medicine with others. What if I miss a dose? Keep appointments for follow-up doses. It is important not to miss your dose. Call your care team if you are unable to keep an appointment. What may interact with this medication? Do  not take this medication with any of the following: Live vaccines This medication may also interact with the following: Cisplatin This list may not describe all possible interactions. Give your health care provider a list of all the medicines, herbs, non-prescription drugs, or dietary supplements you use. Also tell them if you smoke, drink alcohol, or use illegal drugs. Some items may interact with your medicine. What should I watch for while using this medication? Your condition will be monitored carefully while you are receiving this medication. You may need blood work while taking this medication. This medication can cause serious infusion reactions. To reduce the risk your care team may give you other medications to take before receiving this one. Be sure to follow the directions from your care team. This medication may increase your risk of getting an infection. Call your care team for advice if you get a fever, chills, sore throat, or other symptoms of a cold or flu. Do not treat yourself. Try to avoid being around people who are sick. Call your care team if you are around anyone with measles, chickenpox, or if you develop sores or blisters that do not heal properly. Avoid taking medications that contain aspirin, acetaminophen, ibuprofen, naproxen, or ketoprofen unless instructed by your care team. These medications may hide a fever. This medication may cause serious skin reactions. They can happen weeks to months after starting the medication. Contact your care team right away if you notice fevers or flu-like symptoms with a rash. The rash may be red or purple and then turn into blisters or peeling of the skin. You  may also notice a red rash with swelling of the face, lips, or lymph nodes in your neck or under your arms. In some patients, this medication may cause a serious brain infection that may cause death. If you have any problems seeing, thinking, speaking, walking, or standing, tell your  care team right away. If you cannot reach your care team, urgently seek another source of medical care. Talk to your care team if you may be pregnant. Serious birth defects can occur if you take this medication during pregnancy and for 12 months after the last dose. You will need a negative pregnancy test before starting this medication. Contraception is recommended while taking this medication and for 12 months after the last dose. Your care team can help you find the option that works for you. Do not breastfeed while taking this medication and for at least 6 months after the last dose. What side effects may I notice from receiving this medication? Side effects that you should report to your care team as soon as possible: Allergic reactions or angioedema--skin rash, itching or hives, swelling of the face, eyes, lips, tongue, arms, or legs, trouble swallowing or breathing Bowel blockage--stomach cramping, unable to have a bowel movement or pass gas, loss of appetite, vomiting Dizziness, loss of balance or coordination, confusion or trouble speaking Heart attack--pain or tightness in the chest, shoulders, arms, or jaw, nausea, shortness of breath, cold or clammy skin, feeling faint or lightheaded Heart rhythm changes--fast or irregular heartbeat, dizziness, feeling faint or lightheaded, chest pain, trouble breathing Infection--fever, chills, cough, sore throat, wounds that don't heal, pain or trouble when passing urine, general feeling of discomfort or being unwell Infusion reactions--chest pain, shortness of breath or trouble breathing, feeling faint or lightheaded Kidney injury--decrease in the amount of urine, swelling of the ankles, hands, or feet Liver injury--right upper belly pain, loss of appetite, nausea, light-colored stool, dark yellow or brown urine, yellowing skin or eyes, unusual weakness or fatigue Redness, blistering, peeling, or loosening of the skin, including inside the mouth Stomach  pain that is severe, does not go away, or gets worse Tumor lysis syndrome (TLS)--nausea, vomiting, diarrhea, decrease in the amount of urine, dark urine, unusual weakness or fatigue, confusion, muscle pain or cramps, fast or irregular heartbeat, joint pain Side effects that usually do not require medical attention (report to your care team if they continue or are bothersome): Headache Joint pain Nausea Runny or stuffy nose Unusual weakness or fatigue This list may not describe all possible side effects. Call your doctor for medical advice about side effects. You may report side effects to FDA at 1-800-FDA-1088. Where should I keep my medication? This medication is given in a hospital or clinic. It will not be stored at home. NOTE: This sheet is a summary. It may not cover all possible information. If you have questions about this medicine, talk to your doctor, pharmacist, or health care provider.  2024 Elsevier/Gold Standard (2021-08-14 00:00:00)

## 2023-01-27 NOTE — Progress Notes (Signed)
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  He tolerated his second fusion much better with increased premeds.  He currently feels well and is asymptomatic.  He does not complain of any weakness or fatigue.  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 He has no neurologic complaints.  He denies any fevers, weight loss, or night sweats.  He has noted no new lymphadenopathy.  He denies any chest pain, shortness of breath, cough, or hemoptysis.  He denies any nausea, vomiting, constipation, or diarrhea.  He has no urinary complaints.  Patient offers no specific complaints today.  REVIEW OF SYSTEMS:   Review of Systems  Constitutional: Negative.  Negative for diaphoresis, fever, malaise/fatigue and weight loss.  Eyes:  Positive for double vision. Negative for blurred vision and pain.  Respiratory: Negative.  Negative for cough and shortness of breath.   Cardiovascular: Negative.  Negative for chest pain and leg swelling.  Gastrointestinal: Negative.  Negative for abdominal pain.  Genitourinary: Negative.  Negative for dysuria.  Musculoskeletal: Negative.  Negative for neck pain.  Skin: Negative.  Negative for rash.  Neurological: Negative.  Negative for sensory change, focal weakness, weakness and headaches.  Psychiatric/Behavioral: Negative.  The patient is not nervous/anxious.     As per HPI. Otherwise, a complete review of systems is negative.  PAST MEDICAL HISTORY: Past Medical History:  Diagnosis Date    Follicular lymphoma (HCC) 2009   Patient had chemo + rad tx's and zevaline shots./ no flare ups since 2015   GERD (gastroesophageal reflux disease)    Heart murmur    past finding by Dr Judithann Sheen   Hemorrhoids    Hypercholesterolemia    Hypertension    started as a preventative   Seasonal allergies    Sleep apnea    CPAP   Squamous cell carcinoma of skin 02/10/2021   Left hand base of hand - [facility]   Transfusion history    Vertigo 5 yrs ago   positional vertigo    PAST SURGICAL HISTORY: Hemorrhoidectomy.  FAMILY HISTORY: Colon cancer and lung cancer.     ADVANCED DIRECTIVES:    HEALTH MAINTENANCE: Social History   Tobacco Use   Smoking status: Never   Smokeless tobacco: Never  Substance Use Topics   Alcohol use: Yes    Alcohol/week: 2.0 standard drinks of alcohol    Types: 2 Cans of beer per week    Comment: occasional   Drug use: No     Allergies  Allergen Reactions   Rituximab-Pvvr Other (See Comments)    Patient had a hypersensitivity to Rituximab. See progress note on 01/13/23. Patient able to complete infusion.   Tape Rash    bandaids    Current Outpatient Medications  Medication Sig Dispense Refill   alfuzosin (UROXATRAL) 10 MG 24 hr tablet Take 10 mg by mouth daily.     amLODipine (NORVASC) 5 MG tablet Take 5 mg by mouth daily.      aspirin EC 81 MG tablet Take 81 mg by mouth daily.      atorvastatin (LIPITOR)  10 MG tablet Take 10 mg by mouth daily at 6 PM.      Calcium Carbonate-Vitamin D 600-400 MG-UNIT per tablet Take 1 tablet by mouth daily.      cetirizine (ZYRTEC) 10 MG tablet Take 10 mg by mouth daily.     Multiple Vitamin (MULTI-VITAMINS) TABS Take 1 tablet by mouth daily.      omeprazole (PRILOSEC) 40 MG capsule Take 40 mg by mouth daily.      tadalafil (CIALIS) 20 MG tablet Take by mouth daily as needed.      tamsulosin (FLOMAX) 0.4 MG CAPS capsule Take 1 capsule by mouth daily.     No current facility-administered medications for this visit.    Facility-Administered Medications Ordered in Other Visits  Medication Dose Route Frequency Provider Last Rate Last Admin   sodium chloride flush (NS) 0.9 % injection 10 mL  10 mL Intracatheter PRN Jeralyn Ruths, MD   10 mL at 01/27/23 0925    OBJECTIVE: Vitals:   01/27/23 0848  BP: 123/67  Pulse: 91  Resp: 16  Temp: 98.1 F (36.7 C)  SpO2: 98%       Body mass index is 30.87 kg/m.    ECOG FS:0 - Asymptomatic  General: Well-developed, well-nourished, no acute distress. Eyes: Pink conjunctiva, anicteric sclera. HEENT: Normocephalic, moist mucous membranes. Lungs: No audible wheezing or coughing. Heart: Regular rate and rhythm. Abdomen: Soft, nontender, no obvious distention. Musculoskeletal: No edema, cyanosis, or clubbing. Neuro: Alert, answering all questions appropriately. Cranial nerves grossly intact. Skin: No rashes or petechiae noted. Psych: Normal affect.  LAB RESULTS:  Lab Results  Component Value Date   NA 135 01/27/2023   K 3.3 (L) 01/27/2023   CL 105 01/27/2023   CO2 24 01/27/2023   GLUCOSE 111 (H) 01/27/2023   BUN 9 01/27/2023   CREATININE 0.97 01/27/2023   CALCIUM 7.9 (L) 01/27/2023   PROT 5.4 (L) 01/27/2023   ALBUMIN 3.4 (L) 01/27/2023   AST 23 01/27/2023   ALT 12 01/27/2023   ALKPHOS 74 01/27/2023   BILITOT 0.7 01/27/2023   GFRNONAA >60 01/27/2023   GFRAA >60 12/21/2019    Lab Results  Component Value Date   WBC 7.1 01/27/2023   NEUTROABS 5.0 01/27/2023   HGB 11.0 (L) 01/27/2023   HCT 33.6 (L) 01/27/2023   MCV 88.4 01/27/2023   PLT 139 (L) 01/27/2023     STUDIES: No results found.   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  His most recent imaging with CT scanning on December 28, 2022 reviewed independently with continued progression of disease.  Patient also  had a mild pancytopenia and increased fatigue.  He agreed to weekly Rituxan x 4 for control of disease.  Proceed with cycle 3 of treatment today.  Return to in 1 week for further evaluation and consideration of cycle 4.  Plan is to repeat CT scan in 6 weeks after the conclusion of cycle 4.  Based on this results, will determine whether or not to pursue maintenance treatments with Rituxan every 8 weeks for 2 years.   Anemia: Hemoglobin improved to 11.0.  He has a mildly reduced iron saturation ratio and total iron and may benefit from IV Venofer in the future.   Thrombocytopenia: Platelets have improved to 139. Reaction to Rituxan: Likely rate based.  Additional premeds have  been added for the remainder of his treatments.   Patient expressed understanding and was in agreement with this plan. He also understands that He can call clinic at any time with any questions, concerns, or complaints.    Jeralyn Ruths, MD   01/27/2023 12:08 PM

## 2023-02-02 MED FILL — Dexamethasone Sodium Phosphate Inj 100 MG/10ML: INTRAMUSCULAR | Qty: 2 | Status: AC

## 2023-02-03 ENCOUNTER — Encounter: Payer: Self-pay | Admitting: Oncology

## 2023-02-03 ENCOUNTER — Inpatient Hospital Stay (HOSPITAL_BASED_OUTPATIENT_CLINIC_OR_DEPARTMENT_OTHER): Payer: Medicare Other | Admitting: Oncology

## 2023-02-03 ENCOUNTER — Inpatient Hospital Stay: Payer: Medicare Other

## 2023-02-03 ENCOUNTER — Ambulatory Visit: Payer: Medicare Other

## 2023-02-03 ENCOUNTER — Other Ambulatory Visit: Payer: Self-pay

## 2023-02-03 ENCOUNTER — Other Ambulatory Visit: Payer: Medicare Other

## 2023-02-03 VITALS — BP 123/69 | HR 86

## 2023-02-03 DIAGNOSIS — Z5112 Encounter for antineoplastic immunotherapy: Secondary | ICD-10-CM | POA: Diagnosis not present

## 2023-02-03 DIAGNOSIS — C8218 Follicular lymphoma grade II, lymph nodes of multiple sites: Secondary | ICD-10-CM

## 2023-02-03 LAB — CBC (CANCER CENTER ONLY)
HCT: 36.3 % — ABNORMAL LOW (ref 39.0–52.0)
Hemoglobin: 11.5 g/dL — ABNORMAL LOW (ref 13.0–17.0)
MCH: 28.3 pg (ref 26.0–34.0)
MCHC: 31.7 g/dL (ref 30.0–36.0)
MCV: 89.4 fL (ref 80.0–100.0)
Platelet Count: 176 10*3/uL (ref 150–400)
RBC: 4.06 MIL/uL — ABNORMAL LOW (ref 4.22–5.81)
RDW: 13.8 % (ref 11.5–15.5)
WBC Count: 5.6 10*3/uL (ref 4.0–10.5)
nRBC: 0 % (ref 0.0–0.2)

## 2023-02-03 LAB — CMP (CANCER CENTER ONLY)
ALT: 15 U/L (ref 0–44)
AST: 20 U/L (ref 15–41)
Albumin: 3.3 g/dL — ABNORMAL LOW (ref 3.5–5.0)
Alkaline Phosphatase: 67 U/L (ref 38–126)
Anion gap: 6 (ref 5–15)
BUN: 12 mg/dL (ref 8–23)
CO2: 26 mmol/L (ref 22–32)
Calcium: 8.3 mg/dL — ABNORMAL LOW (ref 8.9–10.3)
Chloride: 103 mmol/L (ref 98–111)
Creatinine: 0.85 mg/dL (ref 0.61–1.24)
GFR, Estimated: >60 mL/min (ref >60)
Glucose, Bld: 108 mg/dL — ABNORMAL HIGH (ref 70–99)
Potassium: 3.7 mmol/L (ref 3.5–5.1)
Sodium: 135 mmol/L (ref 135–145)
Total Bilirubin: 0.5 mg/dL (ref 0.3–1.2)
Total Protein: 5.4 g/dL — ABNORMAL LOW (ref 6.5–8.1)

## 2023-02-03 LAB — FOLATE: Folate: 20.7 ng/mL (ref >5.9)

## 2023-02-03 LAB — VITAMIN B12: Vitamin B-12: 330 pg/mL (ref 180–914)

## 2023-02-03 LAB — SEDIMENTATION RATE: Sed Rate: 7 mm/h (ref 0–20)

## 2023-02-03 MED ORDER — HEPARIN SOD (PORK) LOCK FLUSH 100 UNIT/ML IV SOLN
500.0000 [IU] | Freq: Once | INTRAVENOUS | Status: AC | PRN
Start: 2023-02-03 — End: 2023-02-03
  Administered 2023-02-03: 500 [IU]
  Filled 2023-02-03: qty 5

## 2023-02-03 MED ORDER — ACETAMINOPHEN 325 MG PO TABS
650.0000 mg | ORAL_TABLET | Freq: Once | ORAL | Status: AC
  Administered 2023-02-03: 650 mg via ORAL
  Filled 2023-02-03: qty 2

## 2023-02-03 MED ORDER — SODIUM CHLORIDE 0.9 % IV SOLN
Freq: Once | INTRAVENOUS | Status: AC
  Filled 2023-02-03: qty 250

## 2023-02-03 MED ORDER — DIPHENHYDRAMINE HCL 25 MG PO CAPS
50.0000 mg | ORAL_CAPSULE | Freq: Once | ORAL | Status: AC
  Administered 2023-02-03: 50 mg via ORAL
  Filled 2023-02-03: qty 2

## 2023-02-03 MED ORDER — SODIUM CHLORIDE 0.9 % IV SOLN
375.0000 mg/m2 | Freq: Once | INTRAVENOUS | Status: AC
  Administered 2023-02-03: 800 mg via INTRAVENOUS
  Filled 2023-02-03: qty 50

## 2023-02-03 MED ORDER — FAMOTIDINE IN NACL 20-0.9 MG/50ML-% IV SOLN
20.0000 mg | Freq: Once | INTRAVENOUS | Status: AC
  Administered 2023-02-03: 20 mg via INTRAVENOUS
  Filled 2023-02-03: qty 50

## 2023-02-03 MED ORDER — SODIUM CHLORIDE 0.9 % IV SOLN
20.0000 mg | Freq: Once | INTRAVENOUS | Status: AC
  Administered 2023-02-03: 20 mg via INTRAVENOUS
  Filled 2023-02-03: qty 20

## 2023-02-03 NOTE — Progress Notes (Signed)
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  He is tolerating his treatments well without significant side effects.  He currently feels well and is asymptomatic.  He does not complain of any weakness or fatigue.  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 He has no neurologic complaints.  He denies any fevers, weight loss, or night sweats.  He has noted no new lymphadenopathy.  He denies any chest pain, shortness of breath, cough, or hemoptysis.  He denies any nausea, vomiting, constipation, or diarrhea.  He has no urinary complaints.  Patient offers no specific complaints today.  REVIEW OF SYSTEMS:   Review of Systems  Constitutional: Negative.  Negative for diaphoresis, fever, malaise/fatigue and weight loss.  Eyes:  Positive for double vision. Negative for blurred vision and pain.  Respiratory: Negative.  Negative for cough and shortness of breath.   Cardiovascular: Negative.  Negative for chest pain and leg swelling.  Gastrointestinal: Negative.  Negative for abdominal pain.  Genitourinary: Negative.  Negative for dysuria.  Musculoskeletal: Negative.  Negative for neck pain.  Skin: Negative.  Negative for rash.  Neurological: Negative.  Negative for sensory change, focal weakness, weakness and headaches.  Psychiatric/Behavioral: Negative.  The patient is not nervous/anxious.     As per HPI. Otherwise, a complete review of systems is negative.  PAST MEDICAL HISTORY: Past Medical History:  Diagnosis Date    Follicular lymphoma (HCC) 2009   Patient had chemo + rad tx's and zevaline shots./ no flare ups since 2015   GERD (gastroesophageal reflux disease)    Heart murmur    past finding by Dr Judithann Sheen   Hemorrhoids    Hypercholesterolemia    Hypertension    started as a preventative   Seasonal allergies    Sleep apnea    CPAP   Squamous cell carcinoma of skin 02/10/2021   Left hand base of hand - [facility]   Transfusion history    Vertigo 5 yrs ago   positional vertigo    PAST SURGICAL HISTORY: Hemorrhoidectomy.  FAMILY HISTORY: Colon cancer and lung cancer.     ADVANCED DIRECTIVES:    HEALTH MAINTENANCE: Social History   Tobacco Use   Smoking status: Never   Smokeless tobacco: Never  Substance Use Topics   Alcohol use: Yes    Alcohol/week: 2.0 standard drinks of alcohol    Types: 2 Cans of beer per week    Comment: occasional   Drug use: No     Allergies  Allergen Reactions   Rituximab-Pvvr Other (See Comments)    Patient had a hypersensitivity to Rituximab. See progress note on 01/13/23. Patient able to complete infusion.   Tape Rash    bandaids    Current Outpatient Medications  Medication Sig Dispense Refill   alfuzosin (UROXATRAL) 10 MG 24 hr tablet Take 10 mg by mouth daily.     amLODipine (NORVASC) 5 MG tablet Take 5 mg by mouth daily.      aspirin EC 81 MG tablet Take 81 mg by mouth daily.      atorvastatin (LIPITOR)  10 MG tablet Take 10 mg by mouth daily at 6 PM.      Calcium Carbonate-Vitamin D 600-400 MG-UNIT per tablet Take 1 tablet by mouth daily.      cetirizine (ZYRTEC) 10 MG tablet Take 10 mg by mouth daily.     Multiple Vitamin (MULTI-VITAMINS) TABS Take 1 tablet by mouth daily.      omeprazole (PRILOSEC) 40 MG capsule Take 40 mg by mouth daily.      tadalafil (CIALIS) 20 MG tablet Take by mouth daily as needed.      tamsulosin (FLOMAX) 0.4 MG CAPS capsule Take 1 capsule by mouth daily.     No current facility-administered medications for this visit.     OBJECTIVE: Vitals:   02/03/23 0855  BP: 129/63  Pulse: 78  Resp: 16  Temp: (!) 96.4 F (35.8 C)  SpO2: 100%       Body mass index is 29.98 kg/m.    ECOG FS:0 - Asymptomatic  General: Well-developed, well-nourished, no acute distress. Eyes: Pink conjunctiva, anicteric sclera. HEENT: Normocephalic, moist mucous membranes.  No palpable lymphadenopathy. Lungs: No audible wheezing or coughing. Heart: Regular rate and rhythm. Abdomen: Soft, nontender, no obvious distention. Musculoskeletal: No edema, cyanosis, or clubbing. Neuro: Alert, answering all questions appropriately. Cranial nerves grossly intact. Skin: No rashes or petechiae noted. Psych: Normal affect.   LAB RESULTS:  Lab Results  Component Value Date   NA 135 02/03/2023   K 3.7 02/03/2023   CL 103 02/03/2023   CO2 26 02/03/2023   GLUCOSE 108 (H) 02/03/2023   BUN 12 02/03/2023   CREATININE 0.85 02/03/2023   CALCIUM 8.3 (L) 02/03/2023   PROT 5.4 (L) 02/03/2023   ALBUMIN 3.3 (L) 02/03/2023   AST 20 02/03/2023   ALT 15 02/03/2023   ALKPHOS 67 02/03/2023   BILITOT 0.5 02/03/2023   GFRNONAA >60 02/03/2023   GFRAA >60 12/21/2019    Lab Results  Component Value Date   WBC 5.6 02/03/2023   NEUTROABS 5.0 01/27/2023   HGB 11.5 (L) 02/03/2023   HCT 36.3 (L) 02/03/2023   MCV 89.4 02/03/2023   PLT 176 02/03/2023     STUDIES: No results found.   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  His most recent imaging with CT scanning on December 28, 2022 reviewed independently with continued progression of disease.  Patient also had a mild pancytopenia and increased fatigue.  He agreed to weekly Rituxan x 4 for control of disease.  Proceed with cycle 4 of treatment today.  Return to clinic in 6 weeks with repeat imaging with further evaluation 1  week later.  Based on the CT results, will determine whether or not to pursue maintenance treatments with Rituxan every 8 weeks for 2 years.   Anemia: Hemoglobin continues to improve and is now 11.5.  He has a mildly reduced iron saturation ratio and total iron and may benefit from IV Venofer in the future.  B12 and folate levels are pending at time of dictation. Thrombocytopenia: Resolved. Reaction to Rituxan: Likely rate based.  Additional premeds have been added for the remainder of his treatments.   Patient expressed understanding and was in agreement with this plan. He also understands that He can call clinic at any time with any questions, concerns, or complaints.    Jeralyn Ruths, MD  02/03/2023 9:42 AM

## 2023-02-03 NOTE — Patient Instructions (Signed)
[facility]  Discharge Instructions: Thank you for choosing [facility] to provide your oncology and hematology care.  If you have a lab appointment with the Cancer Center, please go directly to the Cancer Center and check in at the registration area.  Wear comfortable clothing and clothing appropriate for easy access to any Portacath or PICC line.   We strive to give you quality time with your provider. You may need to reschedule your appointment if you arrive late (15 or more minutes).  Arriving late affects you and other patients whose appointments are after yours.  Also, if you miss three or more appointments without notifying the office, you may be dismissed from the clinic at the provider's discretion.      For prescription refill requests, have your pharmacy contact our office and allow 72 hours for refills to be completed.    Today you received the following chemotherapy and/or immunotherapy agents RITUXAN      To help prevent nausea and vomiting after your treatment, we encourage you to take your nausea medication as directed.  BELOW ARE SYMPTOMS THAT SHOULD BE REPORTED IMMEDIATELY: *FEVER GREATER THAN 100.4 F (38 C) OR HIGHER *CHILLS OR SWEATING *NAUSEA AND VOMITING THAT IS NOT CONTROLLED WITH YOUR NAUSEA MEDICATION *UNUSUAL SHORTNESS OF BREATH *UNUSUAL BRUISING OR BLEEDING *URINARY PROBLEMS (pain or burning when urinating, or frequent urination) *BOWEL PROBLEMS (unusual diarrhea, constipation, pain near the anus) TENDERNESS IN MOUTH AND THROAT WITH OR WITHOUT PRESENCE OF ULCERS (sore throat, sores in mouth, or a toothache) UNUSUAL RASH, SWELLING OR PAIN  UNUSUAL VAGINAL DISCHARGE OR ITCHING   Items with * indicate a potential emergency and should be followed up as soon as possible or go to the Emergency Department if any problems should occur.  Please show the CHEMOTHERAPY ALERT CARD or IMMUNOTHERAPY ALERT CARD at check-in to  the Emergency Department and triage nurse.  Should you have questions after your visit or need to cancel or reschedule your appointment, please contact [facility]  [phone] and follow the prompts.  Office hours are 8:00 a.m. to 4:30 p.m. Monday - Friday. Please note that voicemails left after 4:00 p.m. may not be returned until the following business day.  We are closed weekends and major holidays. You have access to a nurse at all times for urgent questions. Please call the main number to the clinic [phone] and follow the prompts.  For any non-urgent questions, you may also contact your provider using MyChart. We now offer e-Visits for anyone 21 and older to request care online for non-urgent symptoms. For details visit [email or web address].   Also download the MyChart app! Go to the app store, search "MyChart", open the app, select , and log in with your MyChart username and password.  Rituximab Injection What is this medication? RITUXIMAB (ri TUX i mab) treats leukemia and lymphoma. It works by blocking a protein that causes cancer cells to grow and multiply. This helps to slow or stop the spread of cancer cells. It may also be used to treat autoimmune conditions, such as arthritis. It works by slowing down an overactive immune system. It is a monoclonal antibody. This medicine may be used for other purposes; ask your health care provider or pharmacist if you have questions. COMMON BRAND NAME(S): RIABNI, Rituxan, RUXIENCE, truxima What should I tell my care team before I take this medication? They need to know if you have any of these  conditions: Chest pain Heart disease Immune system problems Infection, such as chickenpox, cold sores, hepatitis B, herpes Irregular heartbeat or rhythm Kidney disease Low blood counts, such as low white cells, platelets, red cells Lung disease Recent or upcoming vaccine An unusual or allergic reaction  to rituximab, other medications, foods, dyes, or preservatives Pregnant or trying to get pregnant Breast-feeding How should I use this medication? This medication is injected into a vein. It is given by a care team in a hospital or clinic setting. A special MedGuide will be given to you before each treatment. Be sure to read this information carefully each time. Talk to your care team about the use of this medication in children. While this medication may be prescribed for children as young as 6 months for selected conditions, precautions do apply. Overdosage: If you think you have taken too much of this medicine contact a poison control center or emergency room at once. NOTE: This medicine is only for you. Do not share this medicine with others. What if I miss a dose? Keep appointments for follow-up doses. It is important not to miss your dose. Call your care team if you are unable to keep an appointment. What may interact with this medication? Do not take this medication with any of the following: Live vaccines This medication may also interact with the following: Cisplatin This list may not describe all possible interactions. Give your health care provider a list of all the medicines, herbs, non-prescription drugs, or dietary supplements you use. Also tell them if you smoke, drink alcohol, or use illegal drugs. Some items may interact with your medicine. What should I watch for while using this medication? Your condition will be monitored carefully while you are receiving this medication. You may need blood work while taking this medication. This medication can cause serious infusion reactions. To reduce the risk your care team may give you other medications to take before receiving this one. Be sure to follow the directions from your care team. This medication may increase your risk of getting an infection. Call your care team for advice if you get a fever, chills, sore throat, or other  symptoms of a cold or flu. Do not treat yourself. Try to avoid being around people who are sick. Call your care team if you are around anyone with measles, chickenpox, or if you develop sores or blisters that do not heal properly. Avoid taking medications that contain aspirin, acetaminophen, ibuprofen, naproxen, or ketoprofen unless instructed by your care team. These medications may hide a fever. This medication may cause serious skin reactions. They can happen weeks to months after starting the medication. Contact your care team right away if you notice fevers or flu-like symptoms with a rash. The rash may be red or purple and then turn into blisters or peeling of the skin. You may also notice a red rash with swelling of the face, lips, or lymph nodes in your neck or under your arms. In some patients, this medication may cause a serious brain infection that may cause death. If you have any problems seeing, thinking, speaking, walking, or standing, tell your care team right away. If you cannot reach your care team, urgently seek another source of medical care. Talk to your care team if you may be pregnant. Serious birth defects can occur if you take this medication during pregnancy and for 12 months after the last dose. You will need a negative pregnancy test before starting this medication. Contraception is recommended  while taking this medication and for 12 months after the last dose. Your care team can help you find the option that works for you. Do not breastfeed while taking this medication and for at least 6 months after the last dose. What side effects may I notice from receiving this medication? Side effects that you should report to your care team as soon as possible: Allergic reactions or angioedema--skin rash, itching or hives, swelling of the face, eyes, lips, tongue, arms, or legs, trouble swallowing or breathing Bowel blockage--stomach cramping, unable to have a bowel movement or pass gas,  loss of appetite, vomiting Dizziness, loss of balance or coordination, confusion or trouble speaking Heart attack--pain or tightness in the chest, shoulders, arms, or jaw, nausea, shortness of breath, cold or clammy skin, feeling faint or lightheaded Heart rhythm changes--fast or irregular heartbeat, dizziness, feeling faint or lightheaded, chest pain, trouble breathing Infection--fever, chills, cough, sore throat, wounds that don't heal, pain or trouble when passing urine, general feeling of discomfort or being unwell Infusion reactions--chest pain, shortness of breath or trouble breathing, feeling faint or lightheaded Kidney injury--decrease in the amount of urine, swelling of the ankles, hands, or feet Liver injury--right upper belly pain, loss of appetite, nausea, light-colored stool, dark yellow or brown urine, yellowing skin or eyes, unusual weakness or fatigue Redness, blistering, peeling, or loosening of the skin, including inside the mouth Stomach pain that is severe, does not go away, or gets worse Tumor lysis syndrome (TLS)--nausea, vomiting, diarrhea, decrease in the amount of urine, dark urine, unusual weakness or fatigue, confusion, muscle pain or cramps, fast or irregular heartbeat, joint pain Side effects that usually do not require medical attention (report to your care team if they continue or are bothersome): Headache Joint pain Nausea Runny or stuffy nose Unusual weakness or fatigue This list may not describe all possible side effects. Call your doctor for medical advice about side effects. You may report side effects to FDA at 1-800-FDA-1088. Where should I keep my medication? This medication is given in a hospital or clinic. It will not be stored at home. NOTE: This sheet is a summary. It may not cover all possible information. If you have questions about this medicine, talk to your doctor, pharmacist, or health care provider.  2024 Elsevier/Gold Standard (2021-08-14  00:00:00)

## 2023-02-04 ENCOUNTER — Other Ambulatory Visit: Payer: Self-pay

## 2023-03-17 ENCOUNTER — Ambulatory Visit
Admission: RE | Admit: 2023-03-17 | Discharge: 2023-03-17 | Disposition: A | Discharge status: Home / Self Care | Payer: Medicare Other | Source: Ambulatory Visit | Attending: Oncology | Admitting: Oncology

## 2023-03-17 ENCOUNTER — Inpatient Hospital Stay: Payer: Medicare Other | Attending: Oncology

## 2023-03-17 ENCOUNTER — Ambulatory Visit: Payer: Medicare Other | Admitting: Dermatology

## 2023-03-17 ENCOUNTER — Encounter: Payer: Self-pay | Admitting: Dermatology

## 2023-03-17 DIAGNOSIS — Z7982 Long term (current) use of aspirin: Secondary | ICD-10-CM | POA: Diagnosis not present

## 2023-03-17 DIAGNOSIS — L570 Actinic keratosis: Secondary | ICD-10-CM

## 2023-03-17 DIAGNOSIS — D696 Thrombocytopenia, unspecified: Secondary | ICD-10-CM | POA: Diagnosis not present

## 2023-03-17 DIAGNOSIS — L820 Inflamed seborrheic keratosis: Secondary | ICD-10-CM

## 2023-03-17 DIAGNOSIS — D692 Other nonthrombocytopenic purpura: Secondary | ICD-10-CM

## 2023-03-17 DIAGNOSIS — C8218 Follicular lymphoma grade II, lymph nodes of multiple sites: Secondary | ICD-10-CM | POA: Diagnosis present

## 2023-03-17 DIAGNOSIS — D1801 Hemangioma of skin and subcutaneous tissue: Secondary | ICD-10-CM

## 2023-03-17 DIAGNOSIS — L821 Other seborrheic keratosis: Secondary | ICD-10-CM

## 2023-03-17 DIAGNOSIS — Z1283 Encounter for screening for malignant neoplasm of skin: Secondary | ICD-10-CM

## 2023-03-17 DIAGNOSIS — D649 Anemia, unspecified: Secondary | ICD-10-CM | POA: Insufficient documentation

## 2023-03-17 DIAGNOSIS — Z8572 Personal history of non-Hodgkin lymphomas: Secondary | ICD-10-CM | POA: Insufficient documentation

## 2023-03-17 DIAGNOSIS — Z79899 Other long term (current) drug therapy: Secondary | ICD-10-CM | POA: Insufficient documentation

## 2023-03-17 DIAGNOSIS — W908XXA Exposure to other nonionizing radiation, initial encounter: Secondary | ICD-10-CM

## 2023-03-17 DIAGNOSIS — L814 Other melanin hyperpigmentation: Secondary | ICD-10-CM | POA: Diagnosis not present

## 2023-03-17 DIAGNOSIS — L578 Other skin changes due to chronic exposure to nonionizing radiation: Secondary | ICD-10-CM | POA: Diagnosis not present

## 2023-03-17 DIAGNOSIS — Z85828 Personal history of other malignant neoplasm of skin: Secondary | ICD-10-CM

## 2023-03-17 DIAGNOSIS — Z872 Personal history of diseases of the skin and subcutaneous tissue: Secondary | ICD-10-CM

## 2023-03-17 DIAGNOSIS — Z8589 Personal history of malignant neoplasm of other organs and systems: Secondary | ICD-10-CM

## 2023-03-17 DIAGNOSIS — D229 Melanocytic nevi, unspecified: Secondary | ICD-10-CM

## 2023-03-17 LAB — CBC WITH DIFFERENTIAL/PLATELET
Abs Immature Granulocytes: 0.01 10*3/uL (ref 0.00–0.07)
Basophils Absolute: 0.1 10*3/uL (ref 0.0–0.1)
Basophils Relative: 1 %
Eosinophils Absolute: 0.1 10*3/uL (ref 0.0–0.5)
Eosinophils Relative: 2 %
HCT: 37.6 % — ABNORMAL LOW (ref 39.0–52.0)
Hemoglobin: 12.5 g/dL — ABNORMAL LOW (ref 13.0–17.0)
Immature Granulocytes: 0 %
Lymphocytes Relative: 24 %
Lymphs Abs: 1.3 10*3/uL (ref 0.7–4.0)
MCH: 28.9 pg (ref 26.0–34.0)
MCHC: 33.2 g/dL (ref 30.0–36.0)
MCV: 87 fL (ref 80.0–100.0)
Monocytes Absolute: 0.6 10*3/uL (ref 0.1–1.0)
Monocytes Relative: 11 %
Neutro Abs: 3.3 10*3/uL (ref 1.7–7.7)
Neutrophils Relative %: 62 %
Platelets: 148 10*3/uL — ABNORMAL LOW (ref 150–400)
RBC: 4.32 MIL/uL (ref 4.22–5.81)
RDW: 14.2 % (ref 11.5–15.5)
WBC: 5.3 10*3/uL (ref 4.0–10.5)
nRBC: 0 % (ref 0.0–0.2)

## 2023-03-17 LAB — CMP (CANCER CENTER ONLY)
ALT: 16 U/L (ref 0–44)
AST: 23 U/L (ref 15–41)
Albumin: 3.9 g/dL (ref 3.5–5.0)
Alkaline Phosphatase: 59 U/L (ref 38–126)
Anion gap: 7 (ref 5–15)
BUN: 16 mg/dL (ref 8–23)
CO2: 26 mmol/L (ref 22–32)
Calcium: 8.6 mg/dL — ABNORMAL LOW (ref 8.9–10.3)
Chloride: 105 mmol/L (ref 98–111)
Creatinine: 0.92 mg/dL (ref 0.61–1.24)
GFR, Estimated: >60 mL/min (ref >60)
Glucose, Bld: 103 mg/dL — ABNORMAL HIGH (ref 70–99)
Potassium: 3.6 mmol/L (ref 3.5–5.1)
Sodium: 138 mmol/L (ref 135–145)
Total Bilirubin: 0.8 mg/dL (ref <1.2)
Total Protein: 6 g/dL — ABNORMAL LOW (ref 6.5–8.1)

## 2023-03-17 MED ORDER — HEPARIN SOD (PORK) LOCK FLUSH 100 UNIT/ML IV SOLN
500.0000 [IU] | Freq: Once | INTRAVENOUS | Status: DC
  Filled 2023-03-17: qty 5

## 2023-03-17 MED ORDER — IOHEXOL 300 MG/ML  SOLN
100.0000 mL | Freq: Once | INTRAMUSCULAR | Status: AC | PRN
  Administered 2023-03-17: 100 mL via INTRAVENOUS

## 2023-03-17 MED ORDER — HEPARIN SOD (PORK) LOCK FLUSH 100 UNIT/ML IV SOLN
500.0000 [IU] | Freq: Once | INTRAVENOUS | Status: AC
Start: 2023-03-17 — End: 2023-03-17
  Administered 2023-03-17: 500 [IU] via INTRAVENOUS

## 2023-03-17 MED ORDER — SODIUM CHLORIDE 0.9 % IV SOLN: INTRAVENOUS | Status: DC

## 2023-03-17 MED ORDER — BARIUM SULFATE 2 % PO SUSP
450.0000 mL | ORAL | Status: AC
  Administered 2023-03-17 (×2): 450 mL via ORAL

## 2023-03-17 NOTE — Patient Instructions (Signed)

## 2023-03-17 NOTE — Progress Notes (Signed)
Follow-Up Visit   Subjective  Sean Raymond is a 77 y.o. male who presents for the following: Skin Cancer Screening and Full Body Skin Exam. Hx of SCC. Hx of AKs.  The patient presents for Total-Body Skin Exam (TBSE) for skin cancer screening and mole check. The patient has spots, moles and lesions to be evaluated, some may be new or changing and the patient may have concern these could be cancer.    The following portions of the chart were reviewed this encounter and updated as appropriate: medications, allergies, medical history  Review of Systems:  No other skin or systemic complaints except as noted in HPI or Assessment and Plan.  Objective  Well appearing patient in no apparent distress; mood and affect are within normal limits.  A full examination was performed including scalp, head, eyes, ears, nose, lips, neck, chest, axillae, abdomen, back, buttocks, bilateral upper extremities, bilateral lower extremities, hands, feet, fingers, toes, fingernails, and toenails. All findings within normal limits unless otherwise noted below.   Relevant physical exam findings are noted in the Assessment and Plan.  Left Hand - dorsum x3, face x2, R face x3, L ear x1, mid chest x2 (11) Erythematous thin papules/macules with gritty scale.  face x2, L lateral elbow x2, R forearm x4, back x6, R chest x1 (15) Erythematous keratotic or waxy stuck-on papule or plaque.   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 EDC. 02/10/2021. - No evidence of recurrence today - No lymphadenopathy - Recommend regular full body skin exams - Recommend daily broad spectrum sunscreen SPF 30+ to sun-exposed areas, reapply every 2 hours as needed.  - Call if any new or changing lesions are noted between office visits     SKIN CANCER SCREENING PERFORMED TODAY.  ACTINIC DAMAGE - Chronic condition, secondary to cumulative UV/sun exposure - diffuse scaly  erythematous macules with underlying dyspigmentation - Recommend daily broad spectrum sunscreen SPF 30+ to sun-exposed areas, reapply every 2 hours as needed.  - Staying in the shade or wearing long sleeves, sun glasses (UVA+UVB protection) and wide brim hats (4-inch brim around the entire circumference of the hat) are also recommended for sun protection.  - Call for new or changing lesions.  LENTIGINES, SEBORRHEIC KERATOSES, HEMANGIOMAS - Benign normal skin lesions - Benign-appearing - Call for any changes  MELANOCYTIC NEVI - Tan-brown and/or pink-flesh-colored symmetric macules and papules - Benign appearing on exam today - Observation - Call clinic for new or changing moles - Recommend daily use of broad spectrum spf 30+ sunscreen to sun-exposed areas.    Purpura - Chronic; persistent and recurrent.  Treatable, but not curable. - Violaceous macules and patches at arms. - Benign - Related to trauma, age, sun damage and/or use of blood thinners, chronic use of topical and/or oral steroids - Observe - Can use OTC arnica containing moisturizer such as Dermend Bruise Formula if desired - Call for worsening or other concerns  Hx of lymphoma.    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 They are chronic with expected duration over 1 year. A portion of actinic keratoses will progress to squamous cell carcinoma of the skin. It is not possible to reliably predict which spots will progress to skin cancer and so treatment is recommended to  prevent development of skin cancer.  Recommend daily broad spectrum sunscreen SPF 30+ to sun-exposed areas, reapply every 2 hours as needed.  Recommend staying in the shade or wearing long sleeves, sun glasses (UVA+UVB protection) and wide brim hats (4-inch brim around the entire  circumference of the hat). Call for new or changing lesions. Destruction of lesion - Left Hand - dorsum x3, face x2, R face x3, L ear x1, mid chest x2 (11) Complexity: simple   Destruction method: cryotherapy   Informed consent: discussed and consent obtained   Timeout:  patient name, date of birth, surgical site, and procedure verified Lesion destroyed using liquid nitrogen: Yes   Region frozen until ice ball extended beyond lesion: Yes   Outcome: patient tolerated procedure well with no complications   Post-procedure details: wound care instructions given   Additional details:  Prior to procedure, discussed risks of blister formation, small wound, skin dyspigmentation, or rare scar following cryotherapy. Recommend Vaseline ointment to treated areas while healing.  INFLAMED SEBORRHEIC KERATOSIS (15) face x2, L lateral elbow x2, R forearm x4, back x6, R chest x1 (15) Symptomatic, irritating, patient would like treated. Destruction of lesion - face x2, L lateral elbow x2, R forearm x4, back x6, R chest x1 (15) Complexity: simple   Destruction method: cryotherapy   Informed consent: discussed and consent obtained   Timeout:  patient name, date of birth, surgical site, and procedure verified Lesion destroyed using liquid nitrogen: Yes   Region frozen until ice ball extended beyond lesion: Yes   Outcome: patient tolerated procedure well with no complications   Post-procedure details: wound care instructions given   Additional details:  Prior to procedure, discussed risks of blister formation, small wound, skin dyspigmentation, or rare scar following cryotherapy. Recommend Vaseline ointment to treated areas while healing.   No follow-ups on file.  I, Lawson Radar, CMA, am acting as scribe for Armida Sans, MD.   Documentation: I have reviewed the above documentation for accuracy and completeness, and I agree with the above.  Armida Sans, MD

## 2023-03-18 ENCOUNTER — Other Ambulatory Visit: Payer: Self-pay

## 2023-03-24 ENCOUNTER — Encounter: Payer: Self-pay | Admitting: Oncology

## 2023-03-24 ENCOUNTER — Inpatient Hospital Stay (HOSPITAL_BASED_OUTPATIENT_CLINIC_OR_DEPARTMENT_OTHER): Payer: Medicare Other | Admitting: Oncology

## 2023-03-24 VITALS — BP 131/68 | HR 89 | Temp 96.5°F | Resp 16 | Ht 68.5 in | Wt 210.0 lb

## 2023-03-24 DIAGNOSIS — Z8572 Personal history of non-Hodgkin lymphomas: Secondary | ICD-10-CM | POA: Diagnosis not present

## 2023-03-24 DIAGNOSIS — C8218 Follicular lymphoma grade II, lymph nodes of multiple sites: Secondary | ICD-10-CM

## 2023-03-24 NOTE — Progress Notes (Signed)
Wondering about next port flush.

## 2023-03-24 NOTE — Progress Notes (Signed)
DISCONTINUE ON PATHWAY REGIMEN - Lymphoma and CLL     A cycle is every 7 days:     Rituximab-xxxx   **Always confirm dose/schedule in your pharmacy ordering system**  PRIOR TREATMENT: [id number]: Rituximab 375 mg/m2 IV Weekly x 4 Weeks  START OFF PATHWAY REGIMEN - Lymphoma and CLL   [id number]:Rituximab IV/SUBQ D1 q56 Days:   Cycle 1: A cycle is 56 days:     Rituximab-xxxx    Cycles 2 and beyond: A cycle is every 56 days:     Rituximab and hyaluronidase human   **Always confirm dose/schedule in your pharmacy ordering system**  Patient Characteristics: Follicular Lymphoma, Grades 1, 2, and 3A, First Line, Stage III / IV, Asymptomatic or Low Bulk Disease Disease Type: Follicular Lymphoma, Grade 1, 2, or 3A Disease Type: Not Applicable Disease Type: Not Applicable Line of Therapy: First Line Disease Characteristics: Asymptomatic or Low Bulk Disease Intent of Therapy: Non-Curative / Palliative Intent, Discussed with Patient

## 2023-03-24 NOTE — Progress Notes (Signed)
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  He continues to feel well and remains asymptomatic.  He does not complain of any weakness or fatigue.  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 He has no neurologic complaints.  He denies any fevers, weight loss, or night sweats.  He has noted no new lymphadenopathy.  He denies any chest pain, shortness of breath, cough, or hemoptysis.  He denies any nausea, vomiting, constipation, or diarrhea.  He has no urinary complaints.  Patient offers no further specific complaints today.  REVIEW OF SYSTEMS:   Review of Systems  Constitutional: Negative.  Negative for diaphoresis, fever, malaise/fatigue and weight loss.  Eyes:  Positive for double vision. Negative for blurred vision and pain.  Respiratory: Negative.  Negative for cough and shortness of breath.   Cardiovascular: Negative.  Negative for chest pain and leg swelling.  Gastrointestinal: Negative.  Negative for abdominal pain.  Genitourinary: Negative.  Negative for dysuria.  Musculoskeletal: Negative.  Negative for neck pain.  Skin: Negative.  Negative for rash.  Neurological: Negative.  Negative for sensory change, focal weakness, weakness and headaches.  Psychiatric/Behavioral: Negative.  The patient is not nervous/anxious.     As per HPI. Otherwise, a complete review of systems is negative.  PAST MEDICAL HISTORY: Past Medical History:  Diagnosis Date    Follicular lymphoma (HCC) 2009   Patient had chemo + rad tx's and zevaline shots./ no flare ups since 2015   GERD (gastroesophageal reflux disease)    Heart murmur    past finding by Dr Judithann Sheen   Hemorrhoids    Hypercholesterolemia    Hypertension    started as a preventative   Seasonal allergies    Sleep apnea    CPAP   Squamous cell carcinoma of skin 02/10/2021   Left hand base of hand - [facility]   Transfusion history    Vertigo 5 yrs ago   positional vertigo    PAST SURGICAL HISTORY: Hemorrhoidectomy.  FAMILY HISTORY: Colon cancer and lung cancer.     ADVANCED DIRECTIVES:    HEALTH MAINTENANCE: Social History   Tobacco Use   Smoking status: Never   Smokeless tobacco: Never  Substance Use Topics   Alcohol use: Yes    Alcohol/week: 2.0 standard drinks of alcohol    Types: 2 Cans of beer per week    Comment: occasional   Drug use: No     Allergies  Allergen Reactions   Rituximab-Pvvr Other (See Comments)    Patient had a hypersensitivity to Rituximab. See progress note on 01/13/23. Patient able to complete infusion.   Tape Rash    bandaids    Current Outpatient Medications  Medication Sig Dispense Refill   alfuzosin (UROXATRAL) 10 MG 24 hr tablet Take 10 mg by mouth daily.     amLODipine (NORVASC) 5 MG tablet Take 5 mg by mouth daily.      aspirin EC 81 MG tablet Take 81 mg by mouth daily.      atorvastatin (LIPITOR) 10 MG tablet  Take 10 mg by mouth daily at 6 PM.      Calcium Carbonate-Vitamin D 600-400 MG-UNIT per tablet Take 1 tablet by mouth daily.      cetirizine (ZYRTEC) 10 MG tablet Take 10 mg by mouth daily.     Multiple Vitamin (MULTI-VITAMINS) TABS Take 1 tablet by mouth daily.      omeprazole (PRILOSEC) 40 MG capsule Take 40 mg by mouth daily.      tadalafil (CIALIS) 20 MG tablet Take by mouth daily as needed.      tamsulosin (FLOMAX) 0.4 MG CAPS capsule Take 1 capsule by mouth daily.     No current facility-administered medications for this  visit.    OBJECTIVE: Vitals:   03/24/23 1014  Resp: 16  Temp: (!) 96.5 F (35.8 C)       Body mass index is 31.47 kg/m.    ECOG FS:0 - Asymptomatic  General: Well-developed, well-nourished, no acute distress. Eyes: Pink conjunctiva, anicteric sclera. HEENT: Normocephalic, moist mucous membranes. Lungs: No audible wheezing or coughing. Heart: Regular rate and rhythm. Abdomen: Soft, nontender, no obvious distention. Musculoskeletal: No edema, cyanosis, or clubbing. Neuro: Alert, answering all questions appropriately. Cranial nerves grossly intact. Skin: No rashes or petechiae noted. Psych: Normal affect.  LAB RESULTS:  Lab Results  Component Value Date   NA 138 03/17/2023   K 3.6 03/17/2023   CL 105 03/17/2023   CO2 26 03/17/2023   GLUCOSE 103 (H) 03/17/2023   BUN 16 03/17/2023   CREATININE 0.92 03/17/2023   CALCIUM 8.6 (L) 03/17/2023   PROT 6.0 (L) 03/17/2023   ALBUMIN 3.9 03/17/2023   AST 23 03/17/2023   ALT 16 03/17/2023   ALKPHOS 59 03/17/2023   BILITOT 0.8 03/17/2023   GFRNONAA >60 03/17/2023   GFRAA >60 12/21/2019    Lab Results  Component Value Date   WBC 5.3 03/17/2023   NEUTROABS 3.3 03/17/2023   HGB 12.5 (L) 03/17/2023   HCT 37.6 (L) 03/17/2023   MCV 87.0 03/17/2023   PLT 148 (L) 03/17/2023     STUDIES: CT CHEST ABDOMEN PELVIS W CONTRAST Result Date: 03/18/2023 CLINICAL DATA:  Hematologic malignancy, assess treatment response, follow-up lymphoma * Tracking Code: BO * EXAM: CT CHEST, ABDOMEN, AND PELVIS WITH CONTRAST TECHNIQUE: Multidetector CT imaging of the chest, abdomen and pelvis was performed following the standard protocol during bolus administration of intravenous contrast. RADIATION DOSE REDUCTION: This exam was performed according to the departmental dose-optimization program which includes automated exposure control, adjustment of the mA and/or kV according to patient size and/or use of iterative reconstruction technique. CONTRAST:   OMNIPAQUE IOHEXOL 300 MG/ML SOLN additional oral enteric contrast COMPARISON:  12/28/2022 FINDINGS: CT CHEST FINDINGS Cardiovascular: Right chest port catheter. Aortic atherosclerosis. Normal heart size. No pericardial effusion. Mediastinum/Nodes: Interval decrease in size of bilateral axillary, subpectoral, lower cervical, and mediastinal lymph nodes, index right axillary node measuring 1.2 x 0.8 cm, previously 2.5 x 1.3 cm (series 4, image 16), index prevascular node measuring 0.9 x 0.6 cm, previously 1.6 x 1.0 cm (series 4, image 12). Thyroid gland, trachea, and esophagus demonstrate no significant findings. Lungs/Pleura: Unchanged bandlike scarring and volume loss of the dependent left lower lobe (series 6, image 80). No pleural effusion or pneumothorax. Musculoskeletal: No chest wall abnormality. No acute osseous findings. CT ABDOMEN PELVIS FINDINGS Hepatobiliary: No solid liver abnormality is seen. Hepatic steatosis. No gallstones, gallbladder wall thickening, or biliary dilatation. Pancreas: Unremarkable. No pancreatic ductal dilatation or surrounding inflammatory changes. Spleen: Diminished splenomegaly,  maximum coronal span 15.1 cm, previously 18.1 cm (series 8, image 110). Small benign splenic cysts, requiring no specific further follow-up or characterization. Adrenals/Urinary Tract: Adrenal glands are unremarkable. Kidneys are normal, without renal calculi, solid lesion, or hydronephrosis. Thickening of the urinary bladder wall with a diverticulum Stomach/Bowel: Stomach is within normal limits. Appendix appears normal. No evidence of bowel wall thickening, distention, or inflammatory changes. Vascular/Lymphatic: Aortic atherosclerosis. Numerous celiac axis, porta hepatis, portacaval, retroperitoneal, bilateral iliac, and inguinal lymph nodes are diminished in size, index left retroperitoneal node measuring 1.5 x 1.4 cm, previously 3.2 x 2.8 cm (series 4, image 59). There are however enlarged left external  iliac lymph nodes which are slightly increased in size, for example measuring 2.7 x 2.0 cm, previously 2.1 x 1.4 cm (series 4, image 103). Reproductive: Prostatomegaly. Other: No abdominal wall hernia or abnormality. No ascites. Musculoskeletal: No acute osseous findings. IMPRESSION: 1. Interval decrease in size of numerous lymph nodes throughout the chest, abdomen and pelvis. Diminished splenomegaly. 2. There are however enlarged left external iliac lymph nodes which are slightly increased in size. 3. Findings are overall consistent with treatment response, however with evidence of mixed response involving left external iliac lymph nodes. 4. Prostatomegaly. 5. Hepatic steatosis. Aortic Atherosclerosis (ICD10-I70.0). Electronically Signed   By: Jearld Lesch M.D.   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  Imaging with CT scan on December 28, 2022 reviewed independently with continued progression of disease.  Patient also had a mild pancytopenia and increased fatigue.  He agreed to weekly Rituxan x 4 for control of disease.  Patient completed 4 of treatment on February 03, 2023.  Repeat imaging on March 18, 2023 reviewed independently and reported as above with a mixed response to therapy.  Because of this, patient has agreed to maintenance Rituxan every 8 weeks for a total of 2 years.  Return to clinic on April 14, 2023 to initiate cycle 1.  Anemia: Essentially resolved.  He has a mildly reduced iron saturation ratio and total iron and may benefit from IV Venofer in the future.  B12 and folate levels are pending at time of dictation. Thrombocytopenia: Mild.   Reaction to Rituxan: Likely rate based.  Additional premeds have been added for the remainder of his treatments.  I spent a total of 30 minutes reviewing  chart data, face-to-face evaluation with the patient, counseling and coordination of care as detailed above.   Patient expressed understanding and was in agreement with this plan. He also understands that He can call clinic at any time with any questions, concerns, or complaints.    Jeralyn Ruths, MD   03/24/2023 10:25 AM

## 2023-03-24 NOTE — Progress Notes (Signed)
[facility] - Lymphoma and CLL  No Change  Continue With Treatment as Ordered.  Original Decision Date/Time: 01/05/2023 16:00     A cycle is every 7 days:     Rituximab-xxxx   **Always confirm dose/schedule in your pharmacy ordering system**  Patient Characteristics: Follicular Lymphoma, Grades 1, 2, and 3A, First Line, Stage III / IV, Asymptomatic or Low Bulk Disease Disease Type: Follicular Lymphoma, Grade 1, 2, or 3A Disease Type: Not Applicable Disease Type: Not Applicable Line of Therapy: First Line Disease Characteristics: Asymptomatic or Low Bulk Disease Intent of Therapy: Non-Curative / Palliative Intent, Discussed with Patient

## 2023-04-05 ENCOUNTER — Encounter: Payer: Self-pay | Admitting: Oncology

## 2023-04-13 ENCOUNTER — Other Ambulatory Visit: Payer: Self-pay | Admitting: *Deleted

## 2023-04-13 DIAGNOSIS — C8218 Follicular lymphoma grade II, lymph nodes of multiple sites: Secondary | ICD-10-CM

## 2023-04-14 ENCOUNTER — Inpatient Hospital Stay (HOSPITAL_BASED_OUTPATIENT_CLINIC_OR_DEPARTMENT_OTHER): Payer: Medicare Other | Admitting: Oncology

## 2023-04-14 ENCOUNTER — Encounter: Payer: Self-pay | Admitting: Oncology

## 2023-04-14 ENCOUNTER — Inpatient Hospital Stay: Payer: Medicare Other

## 2023-04-14 ENCOUNTER — Inpatient Hospital Stay: Payer: Medicare Other | Attending: Oncology

## 2023-04-14 VITALS — BP 131/68 | HR 78 | Temp 98.0°F | Resp 18

## 2023-04-14 VITALS — BP 140/71 | HR 87 | Temp 97.1°F | Resp 18 | Ht 68.5 in | Wt 210.0 lb

## 2023-04-14 DIAGNOSIS — C8218 Follicular lymphoma grade II, lymph nodes of multiple sites: Secondary | ICD-10-CM | POA: Diagnosis not present

## 2023-04-14 DIAGNOSIS — Z5112 Encounter for antineoplastic immunotherapy: Secondary | ICD-10-CM | POA: Insufficient documentation

## 2023-04-14 DIAGNOSIS — C821A Follicular lymphoma grade II, in remission: Secondary | ICD-10-CM | POA: Diagnosis present

## 2023-04-14 DIAGNOSIS — Z79899 Other long term (current) drug therapy: Secondary | ICD-10-CM | POA: Insufficient documentation

## 2023-04-14 LAB — CBC WITH DIFFERENTIAL (CANCER CENTER ONLY)
Abs Immature Granulocytes: 0.03 10*3/uL (ref 0.00–0.07)
Basophils Absolute: 0.1 10*3/uL (ref 0.0–0.1)
Basophils Relative: 1 %
Eosinophils Absolute: 0.1 10*3/uL (ref 0.0–0.5)
Eosinophils Relative: 2 %
HCT: 39.4 % (ref 39.0–52.0)
Hemoglobin: 13.1 g/dL (ref 13.0–17.0)
Immature Granulocytes: 1 %
Lymphocytes Relative: 23 %
Lymphs Abs: 1.1 10*3/uL (ref 0.7–4.0)
MCH: 28.9 pg (ref 26.0–34.0)
MCHC: 33.2 g/dL (ref 30.0–36.0)
MCV: 87 fL (ref 80.0–100.0)
Monocytes Absolute: 0.5 10*3/uL (ref 0.1–1.0)
Monocytes Relative: 10 %
Neutro Abs: 3 10*3/uL (ref 1.7–7.7)
Neutrophils Relative %: 63 %
Platelet Count: 131 10*3/uL — ABNORMAL LOW (ref 150–400)
RBC: 4.53 MIL/uL (ref 4.22–5.81)
RDW: 14.6 % (ref 11.5–15.5)
WBC Count: 4.8 10*3/uL (ref 4.0–10.5)
nRBC: 0 % (ref 0.0–0.2)

## 2023-04-14 LAB — CMP (CANCER CENTER ONLY)
ALT: 18 U/L (ref 0–44)
AST: 29 U/L (ref 15–41)
Albumin: 4.1 g/dL (ref 3.5–5.0)
Alkaline Phosphatase: 58 U/L (ref 38–126)
Anion gap: 9 (ref 5–15)
BUN: 11 mg/dL (ref 8–23)
CO2: 24 mmol/L (ref 22–32)
Calcium: 8.8 mg/dL — ABNORMAL LOW (ref 8.9–10.3)
Chloride: 105 mmol/L (ref 98–111)
Creatinine: 0.85 mg/dL (ref 0.61–1.24)
GFR, Estimated: >60 mL/min (ref >60)
Glucose, Bld: 164 mg/dL — ABNORMAL HIGH (ref 70–99)
Potassium: 3.5 mmol/L (ref 3.5–5.1)
Sodium: 138 mmol/L (ref 135–145)
Total Bilirubin: 0.8 mg/dL (ref 0.0–1.2)
Total Protein: 5.9 g/dL — ABNORMAL LOW (ref 6.5–8.1)

## 2023-04-14 MED ORDER — DEXAMETHASONE SODIUM PHOSPHATE 10 MG/ML IJ SOLN
10.0000 mg | Freq: Once | INTRAMUSCULAR | Status: AC
Start: 2023-04-14 — End: 2023-04-14
  Administered 2023-04-14: 10 mg via INTRAVENOUS
  Filled 2023-04-14: qty 1

## 2023-04-14 MED ORDER — FAMOTIDINE IN NACL 20-0.9 MG/50ML-% IV SOLN
20.0000 mg | Freq: Once | INTRAVENOUS | Status: AC
Start: 2023-04-14 — End: 2023-04-14
  Administered 2023-04-14: 20 mg via INTRAVENOUS
  Filled 2023-04-14: qty 50

## 2023-04-14 MED ORDER — ACETAMINOPHEN 325 MG PO TABS
650.0000 mg | ORAL_TABLET | Freq: Once | ORAL | Status: AC
  Administered 2023-04-14: 650 mg via ORAL
  Filled 2023-04-14: qty 2

## 2023-04-14 MED ORDER — HEPARIN SOD (PORK) LOCK FLUSH 100 UNIT/ML IV SOLN
500.0000 [IU] | Freq: Once | INTRAVENOUS | Status: AC | PRN
  Administered 2023-04-14: 500 [IU]
  Filled 2023-04-14: qty 5

## 2023-04-14 MED ORDER — SODIUM CHLORIDE 0.9 % IV SOLN
375.0000 mg/m2 | Freq: Once | INTRAVENOUS | Status: AC
  Administered 2023-04-14: 800 mg via INTRAVENOUS
  Filled 2023-04-14: qty 50

## 2023-04-14 MED ORDER — SODIUM CHLORIDE 0.9 % IV SOLN
INTRAVENOUS | Status: DC
Start: 2023-04-14 — End: 2023-04-14
  Filled 2023-04-14 (×2): qty 250

## 2023-04-14 MED ORDER — DIPHENHYDRAMINE HCL 50 MG/ML IJ SOLN
25.0000 mg | Freq: Once | INTRAMUSCULAR | Status: AC
Start: 2023-04-14 — End: 2023-04-14
  Administered 2023-04-14: 25 mg via INTRAVENOUS
  Filled 2023-04-14: qty 1

## 2023-04-14 NOTE — Progress Notes (Signed)
 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  He continues to feel well remains asymptomatic. He does not complain of any weakness or fatigue.  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 He has no neurologic complaints.  He denies any fevers, weight loss, or night sweats.  He has noted no new lymphadenopathy.  He denies any chest pain, shortness of breath, cough, or hemoptysis.  He denies any nausea, vomiting, constipation, or diarrhea.  He has no urinary complaints.  Patient offers no further specific complaints today.  REVIEW OF SYSTEMS:   Review of Systems  Constitutional: Negative.  Negative for diaphoresis, fever, malaise/fatigue and weight loss.  Eyes:  Positive for double vision. Negative for blurred vision and pain.  Respiratory: Negative.  Negative for cough and shortness of breath.   Cardiovascular: Negative.  Negative for chest pain and leg swelling.  Gastrointestinal: Negative.  Negative for abdominal pain.  Genitourinary: Negative.  Negative for dysuria.  Musculoskeletal: Negative.  Negative for neck pain.  Skin: Negative.  Negative for rash.  Neurological: Negative.  Negative for sensory change, focal weakness, weakness and headaches.  Psychiatric/Behavioral: Negative.  The patient is not nervous/anxious.     As per HPI. Otherwise, a complete review of systems is negative.  PAST MEDICAL HISTORY: Past Medical History:  Diagnosis Date   Follicular lymphoma (HCC) 2009   Patient had chemo + rad tx's and zevaline  shots./ no flare ups since 2015   GERD (gastroesophageal reflux disease)    Heart murmur    past finding by Dr Auston   Hemorrhoids    Hypercholesterolemia    Hypertension    started as a preventative   Seasonal allergies    Sleep apnea    CPAP   Squamous cell carcinoma of skin 02/10/2021   Left hand base of hand - [facility]   Transfusion history    Vertigo 5 yrs ago   positional vertigo    PAST SURGICAL HISTORY: Hemorrhoidectomy.  FAMILY HISTORY: Colon cancer and lung cancer.     ADVANCED DIRECTIVES:    HEALTH MAINTENANCE: Social History   Tobacco Use   Smoking status: Never   Smokeless tobacco: Never  Substance Use Topics   Alcohol use: Yes    Alcohol/week: 2.0 standard drinks of alcohol    Types: 2 Cans of beer per week    Comment: occasional   Drug use: No     Allergies  Allergen Reactions   Rituximab -Pvvr Other (See Comments)    Patient had a hypersensitivity to Rituximab . See progress note on 01/13/23. Patient able to complete infusion.   Tape Rash    bandaids    Current Outpatient Medications  Medication Sig Dispense Refill   alfuzosin (UROXATRAL) 10 MG 24 hr tablet Take 10 mg by mouth daily.     amLODipine (NORVASC) 5 MG tablet Take 5 mg by mouth daily.      aspirin EC 81 MG tablet Take 81 mg by mouth daily.      atorvastatin (LIPITOR) 10 MG tablet Take 10 mg by mouth daily at 6 PM.  Calcium Carbonate-Vitamin D 600-400 MG-UNIT per tablet Take 1 tablet by mouth daily.      cetirizine (ZYRTEC) 10 MG tablet Take 10 mg by mouth daily.     Multiple Vitamin (MULTI-VITAMINS) TABS Take 1 tablet by mouth daily.      omeprazole (PRILOSEC) 40 MG capsule Take 40 mg by mouth daily.      tadalafil (CIALIS) 20 MG tablet Take by mouth daily as needed.      tamsulosin (FLOMAX) 0.4 MG CAPS capsule Take 1 capsule by mouth daily.     No current facility-administered medications for this visit.    OBJECTIVE: Vitals:   04/14/23 0927  BP: (!) 140/71  Pulse: 87   Resp: 18  Temp: (!) 97.1 F (36.2 C)  SpO2: 99%       Body mass index is 31.47 kg/m.    ECOG FS:0 - Asymptomatic  General: Well-developed, well-nourished, no acute distress. Eyes: Pink conjunctiva, anicteric sclera. HEENT: Normocephalic, moist mucous membranes. Lungs: No audible wheezing or coughing. Heart: Regular rate and rhythm. Abdomen: Soft, nontender, no obvious distention. Musculoskeletal: No edema, cyanosis, or clubbing. Neuro: Alert, answering all questions appropriately. Cranial nerves grossly intact. Skin: No rashes or petechiae noted. Psych: Normal affect.  LAB RESULTS:  Lab Results  Component Value Date   NA 138 04/14/2023   K 3.5 04/14/2023   CL 105 04/14/2023   CO2 24 04/14/2023   GLUCOSE 164 (H) 04/14/2023   BUN 11 04/14/2023   CREATININE 0.85 04/14/2023   CALCIUM 8.8 (L) 04/14/2023   PROT 5.9 (L) 04/14/2023   ALBUMIN 4.1 04/14/2023   AST 29 04/14/2023   ALT 18 04/14/2023   ALKPHOS 58 04/14/2023   BILITOT 0.8 04/14/2023   GFRNONAA >60 04/14/2023   GFRAA >60 12/21/2019    Lab Results  Component Value Date   WBC 5.3 03/17/2023   NEUTROABS 3.3 03/17/2023   HGB 12.5 (L) 03/17/2023   HCT 37.6 (L) 03/17/2023   MCV 87.0 03/17/2023   PLT 148 (L) 03/17/2023     STUDIES: CT MAXILLOFACIAL W CONTRAST Result Date: 03/26/2023 CLINICAL DATA:  Hematologic malignancy, assess treatment response follow up lymphoma, assess treatment response EXAM: CT MAXILLOFACIAL WITH CONTRAST TECHNIQUE: Multidetector CT imaging of the maxillofacial structures was performed with intravenous contrast. Multiplanar CT image reconstructions were also generated. RADIATION DOSE REDUCTION: This exam was performed according to the departmental dose-optimization program which includes automated exposure control, adjustment of the mA and/or kV according to patient size and/or use of iterative reconstruction technique. CONTRAST:  OMNIPAQUE  IOHEXOL  300 MG/ML  SOLN COMPARISON:  CT Face  12/28/22 FINDINGS: Osseous: No fracture or mandibular dislocation. No destructive process. Orbits: Bilateral lens replacement. Persistent nonspecific soft tissue thickening along the inferior aspect of the left globe and in the infraorbital soft tissues on the left, unchanged from prior exam. There is also abnormal soft tissue of the floor of the left orbit (series 9, image 35), which appear slightly more conspicuous than on prior imaging. Sinuses: No middle ear or mastoid effusion. Paranasal sinuses are clear. Soft tissues: There is persistent asymmetric soft tissue along the right aspect of the parotid duct (series 5, image 54). This appears more conspicuous than on prior imaging. Persistent skin thickening in the pre zygomatic soft tissues on the left (series 5, image 31), similar to prior exam. Limited intracranial: No significant or unexpected finding. IMPRESSION: 1. Asymmetric soft tissue along the the right parotid duct appears more conspicuous than on prior imaging and may represent lymphomatous  involvement. 2. Abnormal soft tissue at the floor of the left orbit appears slightly more conspicuous than on prior imaging. 3. Persistent nonspecific soft tissue thickening along the inferior aspect of the left globe and in the infraorbital soft tissues on the left, unchanged from prior exam. 4. Persistent skin thickening in the pre zygomatic soft tissues on the left, similar to prior exam. Electronically Signed   By: Lyndall Gore M.D.   On: 03/26/2023 08:54   CT SOFT TISSUE NECK W CONTRAST Result Date: 03/26/2023 CLINICAL DATA:  follow up lymphoma, assess treatment response EXAM: CT NECK WITH CONTRAST TECHNIQUE: Multidetector CT imaging of the neck was performed using the standard protocol following the bolus administration of intravenous contrast. RADIATION DOSE REDUCTION: This exam was performed according to the departmental dose-optimization program which includes automated exposure control, adjustment of  the mA and/or kV according to patient size and/or use of iterative reconstruction technique. CONTRAST:  OMNIPAQUE  IOHEXOL  300 MG/ML  SOLN COMPARISON:  CT Neck 12/28/22 FINDINGS: Pharynx and larynx: There is nonspecific mucosal thickening throughout the oropharynx and hypopharynx. This could represent mucositis and/or pharyngitis. Recommend correlation with direct visualization. Salivary glands: No inflammation, mass, or stone. Thyroid: Normal. Lymph nodes: There is interval improvement in previously seen multi station lymphadenopathy. There is a persistent enlarged left supraclavicular lymph node measuring up to 1.3 cm, previously 1.8 cm (series 3, image 78). Other previously seen lymphadenopathy in the left level 5 and IIb stations has resolved. The soft tissue adjacent to the right thyroid lobe measuring 2.4 x 1.9 cm is similar to prior exam and may represent venous ectasia. Upper mediastinal lymphadenopathy has also improved to nearly resolved compared to prior exam. Vascular: Negative. Limited intracranial: Negative. Visualized orbits: Bilateral lens replacement. Mastoids and visualized paranasal sinuses: No middle ear mastoid effusion. Paranasal sinuses clear. Bilateral lens replacement. Orbits are otherwise unremarkable. Skeleton: No acute or aggressive process. Upper chest: Right chest port place. Other: None. IMPRESSION: 1. Interval improvement in previously seen multi station cervical and mediastinal lymphadenopathy. There is a persistent enlarged left supraclavicular lymph node measuring up to 1.3 cm, previously 1.8 cm. No new or worsening lymphadenopathy. 2. Nonspecific mucosal thickening throughout the oropharynx and hypopharynx. This could represent mucositis and/or pharyngitis. Recommend correlation with direct visualization. Electronically Signed   By: Lyndall Gore M.D.   On: 03/26/2023 08:43   CT CHEST ABDOMEN PELVIS W CONTRAST Result Date: 03/18/2023 CLINICAL DATA:  Hematologic malignancy,  assess treatment response, follow-up lymphoma * Tracking Code: BO * EXAM: CT CHEST, ABDOMEN, AND PELVIS WITH CONTRAST TECHNIQUE: Multidetector CT imaging of the chest, abdomen and pelvis was performed following the standard protocol during bolus administration of intravenous contrast. RADIATION DOSE REDUCTION: This exam was performed according to the departmental dose-optimization program which includes automated exposure control, adjustment of the mA and/or kV according to patient size and/or use of iterative reconstruction technique. CONTRAST:  OMNIPAQUE  IOHEXOL  300 MG/ML SOLN additional oral enteric contrast COMPARISON:  12/28/2022 FINDINGS: CT CHEST FINDINGS Cardiovascular: Right chest port catheter. Aortic atherosclerosis. Normal heart size. No pericardial effusion. Mediastinum/Nodes: Interval decrease in size of bilateral axillary, subpectoral, lower cervical, and mediastinal lymph nodes, index right axillary node measuring 1.2 x 0.8 cm, previously 2.5 x 1.3 cm (series 4, image 16), index prevascular node measuring 0.9 x 0.6 cm, previously 1.6 x 1.0 cm (series 4, image 12). Thyroid gland, trachea, and esophagus demonstrate no significant findings. Lungs/Pleura: Unchanged bandlike scarring and volume loss of the dependent left lower lobe (series 6,  image 80). No pleural effusion or pneumothorax. Musculoskeletal: No chest wall abnormality. No acute osseous findings. CT ABDOMEN PELVIS FINDINGS Hepatobiliary: No solid liver abnormality is seen. Hepatic steatosis. No gallstones, gallbladder wall thickening, or biliary dilatation. Pancreas: Unremarkable. No pancreatic ductal dilatation or surrounding inflammatory changes. Spleen: Diminished splenomegaly, maximum coronal span 15.1 cm, previously 18.1 cm (series 8, image 110). Small benign splenic cysts, requiring no specific further follow-up or characterization. Adrenals/Urinary Tract: Adrenal glands are unremarkable. Kidneys are normal, without renal calculi,  solid lesion, or hydronephrosis. Thickening of the urinary bladder wall with a diverticulum Stomach/Bowel: Stomach is within normal limits. Appendix appears normal. No evidence of bowel wall thickening, distention, or inflammatory changes. Vascular/Lymphatic: Aortic atherosclerosis. Numerous celiac axis, porta hepatis, portacaval, retroperitoneal, bilateral iliac, and inguinal lymph nodes are diminished in size, index left retroperitoneal node measuring 1.5 x 1.4 cm, previously 3.2 x 2.8 cm (series 4, image 59). There are however enlarged left external iliac lymph nodes which are slightly increased in size, for example measuring 2.7 x 2.0 cm, previously 2.1 x 1.4 cm (series 4, image 103). Reproductive: Prostatomegaly. Other: No abdominal wall hernia or abnormality. No ascites. Musculoskeletal: No acute osseous findings. IMPRESSION: 1. Interval decrease in size of numerous lymph nodes throughout the chest, abdomen and pelvis. Diminished splenomegaly. 2. There are however enlarged left external iliac lymph nodes which are slightly increased in size. 3. Findings are overall consistent with treatment response, however with evidence of mixed response involving left external iliac lymph nodes. 4. Prostatomegaly. 5. Hepatic steatosis. Aortic Atherosclerosis (ICD10-I70.0). Electronically Signed   By: Marolyn JONETTA Jaksch M.D.   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  Imaging with CT scan on December 28, 2022 reviewed independently with continued progression of disease.  Patient also had a mild pancytopenia and increased fatigue.  He agreed to weekly Rituxan  x 4 for control of disease.  Patient completed 4 of treatment on February 03, 2023.  Repeat imaging on March 18, 2023 reviewed independently and reported as above with a mixed  response to therapy.  Because of this, patient has agreed to maintenance Rituxan  every 8 weeks for a total of 2 years.  Proceed with cycle 1 of treatment today.  Return to clinic in 8 weeks for further evaluation and consideration of cycle 2.   Anemia: Essentially resolved.  Patient has a mildly reduced total iron and iron saturation ratio and may benefit from IV Venofer in the future if his hemoglobin trends down.  Thrombocytopenia: Mild, monitor..   Reaction to Rituxan : Likely rate based.  Additional premeds have been added for the remainder of his treatments.  I spent a total of 30 minutes reviewing chart data, face-to-face evaluation with the patient, counseling and coordination of care as detailed above.   Patient expressed understanding and was in agreement with this plan. He also understands that He can call clinic at any time with any questions, concerns, or complaints.    Evalene JINNY Reusing, MD   04/14/2023 9:48 AM

## 2023-06-08 ENCOUNTER — Other Ambulatory Visit: Payer: Self-pay | Admitting: *Deleted

## 2023-06-08 DIAGNOSIS — C8218 Follicular lymphoma grade II, lymph nodes of multiple sites: Secondary | ICD-10-CM

## 2023-06-09 ENCOUNTER — Inpatient Hospital Stay: Payer: Medicare Other

## 2023-06-09 ENCOUNTER — Inpatient Hospital Stay: Payer: Medicare Other | Attending: Oncology

## 2023-06-09 ENCOUNTER — Inpatient Hospital Stay (HOSPITAL_BASED_OUTPATIENT_CLINIC_OR_DEPARTMENT_OTHER): Payer: Medicare Other | Admitting: Oncology

## 2023-06-09 ENCOUNTER — Encounter: Payer: Self-pay | Admitting: Oncology

## 2023-06-09 VITALS — BP 125/71 | HR 88 | Temp 97.3°F | Resp 16 | Ht 68.5 in | Wt 209.6 lb

## 2023-06-09 VITALS — BP 125/65 | HR 73 | Resp 16

## 2023-06-09 DIAGNOSIS — C821A Follicular lymphoma grade II, in remission: Secondary | ICD-10-CM | POA: Insufficient documentation

## 2023-06-09 DIAGNOSIS — C8218 Follicular lymphoma grade II, lymph nodes of multiple sites: Secondary | ICD-10-CM

## 2023-06-09 DIAGNOSIS — Z5112 Encounter for antineoplastic immunotherapy: Secondary | ICD-10-CM | POA: Diagnosis present

## 2023-06-09 DIAGNOSIS — Z79620 Long term (current) use of immunosuppressive biologic: Secondary | ICD-10-CM | POA: Diagnosis not present

## 2023-06-09 LAB — CBC WITH DIFFERENTIAL (CANCER CENTER ONLY)
Abs Immature Granulocytes: 0.02 10*3/uL (ref 0.00–0.07)
Basophils Absolute: 0 10*3/uL (ref 0.0–0.1)
Basophils Relative: 1 %
Eosinophils Absolute: 0.1 10*3/uL (ref 0.0–0.5)
Eosinophils Relative: 2 %
HCT: 37.2 % — ABNORMAL LOW (ref 39.0–52.0)
Hemoglobin: 12.7 g/dL — ABNORMAL LOW (ref 13.0–17.0)
Immature Granulocytes: 0 %
Lymphocytes Relative: 20 %
Lymphs Abs: 0.9 10*3/uL (ref 0.7–4.0)
MCH: 29.5 pg (ref 26.0–34.0)
MCHC: 34.1 g/dL (ref 30.0–36.0)
MCV: 86.5 fL (ref 80.0–100.0)
Monocytes Absolute: 0.5 10*3/uL (ref 0.1–1.0)
Monocytes Relative: 10 %
Neutro Abs: 3 10*3/uL (ref 1.7–7.7)
Neutrophils Relative %: 67 %
Platelet Count: 163 10*3/uL (ref 150–400)
RBC: 4.3 MIL/uL (ref 4.22–5.81)
RDW: 13.7 % (ref 11.5–15.5)
WBC Count: 4.5 10*3/uL (ref 4.0–10.5)
nRBC: 0 % (ref 0.0–0.2)

## 2023-06-09 LAB — CMP (CANCER CENTER ONLY)
ALT: 32 U/L (ref 0–44)
AST: 37 U/L (ref 15–41)
Albumin: 3.7 g/dL (ref 3.5–5.0)
Alkaline Phosphatase: 53 U/L (ref 38–126)
Anion gap: 7 (ref 5–15)
BUN: 9 mg/dL (ref 8–23)
CO2: 25 mmol/L (ref 22–32)
Calcium: 8.3 mg/dL — ABNORMAL LOW (ref 8.9–10.3)
Chloride: 103 mmol/L (ref 98–111)
Creatinine: 0.86 mg/dL (ref 0.61–1.24)
GFR, Estimated: >60 mL/min (ref >60)
Glucose, Bld: 120 mg/dL — ABNORMAL HIGH (ref 70–99)
Potassium: 3 mmol/L — ABNORMAL LOW (ref 3.5–5.1)
Sodium: 135 mmol/L (ref 135–145)
Total Bilirubin: 0.9 mg/dL (ref 0.0–1.2)
Total Protein: 5.7 g/dL — ABNORMAL LOW (ref 6.5–8.1)

## 2023-06-09 MED ORDER — SODIUM CHLORIDE 0.9 % IV SOLN
INTRAVENOUS | Status: DC
  Filled 2023-06-09: qty 250

## 2023-06-09 MED ORDER — ACETAMINOPHEN 325 MG PO TABS
650.0000 mg | ORAL_TABLET | Freq: Once | ORAL | Status: AC
Start: 2023-06-09 — End: 2023-06-09
  Administered 2023-06-09: 650 mg via ORAL
  Filled 2023-06-09: qty 2

## 2023-06-09 MED ORDER — FAMOTIDINE IN NACL 20-0.9 MG/50ML-% IV SOLN
20.0000 mg | Freq: Once | INTRAVENOUS | Status: AC
  Administered 2023-06-09: 20 mg via INTRAVENOUS
  Filled 2023-06-09: qty 50

## 2023-06-09 MED ORDER — DIPHENHYDRAMINE HCL 50 MG/ML IJ SOLN
25.0000 mg | Freq: Once | INTRAMUSCULAR | Status: AC
  Administered 2023-06-09: 25 mg via INTRAVENOUS
  Filled 2023-06-09: qty 1

## 2023-06-09 MED ORDER — DEXAMETHASONE SODIUM PHOSPHATE 10 MG/ML IJ SOLN
10.0000 mg | Freq: Once | INTRAMUSCULAR | Status: AC
Start: 2023-06-09 — End: 2023-06-09
  Administered 2023-06-09: 10 mg via INTRAVENOUS
  Filled 2023-06-09: qty 1

## 2023-06-09 MED ORDER — SODIUM CHLORIDE 0.9 % IV SOLN
375.0000 mg/m2 | Freq: Once | INTRAVENOUS | Status: AC
  Administered 2023-06-09: 800 mg via INTRAVENOUS
  Filled 2023-06-09: qty 30

## 2023-06-09 MED ORDER — HEPARIN SOD (PORK) LOCK FLUSH 100 UNIT/ML IV SOLN
500.0000 [IU] | Freq: Once | INTRAVENOUS | Status: AC | PRN
  Administered 2023-06-09: 500 [IU]
  Filled 2023-06-09: qty 5

## 2023-06-09 NOTE — Progress Notes (Signed)
 Has a knot under left axilla that he noticed a month ago. Does state there is some tenderness in that area as well.

## 2023-06-09 NOTE — Patient Instructions (Signed)
 [facility] - A DEPT OF [facility]  Discharge Instructions: Thank you for choosing [facility] to provide your oncology and hematology care.  If you have a lab appointment with the Cancer Center, please go directly to the Cancer Center and check in at the registration area.  Wear comfortable clothing and clothing appropriate for easy access to any Portacath or PICC line.   We strive to give you quality time with your provider. You may need to reschedule your appointment if you arrive late (15 or more minutes).  Arriving late affects you and other patients whose appointments are after yours.  Also, if you miss three or more appointments without notifying the office, you may be dismissed from the clinic at the provider's discretion.      For prescription refill requests, have your pharmacy contact our office and allow 72 hours for refills to be completed.    Today you received the following chemotherapy and/or immunotherapy agents Ruxience      To help prevent nausea and vomiting after your treatment, we encourage you to take your nausea medication as directed.  BELOW ARE SYMPTOMS THAT SHOULD BE REPORTED IMMEDIATELY: *FEVER GREATER THAN 100.4 F (38 C) OR HIGHER *CHILLS OR SWEATING *NAUSEA AND VOMITING THAT IS NOT CONTROLLED WITH YOUR NAUSEA MEDICATION *UNUSUAL SHORTNESS OF BREATH *UNUSUAL BRUISING OR BLEEDING *URINARY PROBLEMS (pain or burning when urinating, or frequent urination) *BOWEL PROBLEMS (unusual diarrhea, constipation, pain near the anus) TENDERNESS IN MOUTH AND THROAT WITH OR WITHOUT PRESENCE OF ULCERS (sore throat, sores in mouth, or a toothache) UNUSUAL RASH, SWELLING OR PAIN  UNUSUAL VAGINAL DISCHARGE OR ITCHING   Items with * indicate a potential emergency and should be followed up as soon as possible or go to the Emergency Department if any problems should occur.  Please show the CHEMOTHERAPY ALERT CARD or IMMUNOTHERAPY  ALERT CARD at check-in to the Emergency Department and triage nurse.  Should you have questions after your visit or need to cancel or reschedule your appointment, please contact [facility] - A DEPT OF [facility]  [phone] and follow the prompts.  Office hours are 8:00 a.m. to 4:30 p.m. Monday - Friday. Please note that voicemails left after 4:00 p.m. may not be returned until the following business day.  We are closed weekends and major holidays. You have access to a nurse at all times for urgent questions. Please call the main number to the clinic [phone] and follow the prompts.  For any non-urgent questions, you may also contact your provider using MyChart. We now offer e-Visits for anyone 55 and older to request care online for non-urgent symptoms. For details visit [email or web address].   Also download the MyChart app! Go to the app store, search "MyChart", open the app, select [place], and log in with your MyChart username and password.

## 2023-06-09 NOTE — Progress Notes (Signed)
 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  He has noticed an increase lymph node in his left axilla, but otherwise felt well. He does not complain of any weakness or fatigue.  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 He has no neurologic complaints.  He denies any fevers, weight loss, or night sweats. He denies any chest pain, shortness of breath, cough, or hemoptysis.  He denies any nausea, vomiting, constipation, or diarrhea.  He has no urinary complaints.  Patient offers no further specific complaints today.  REVIEW OF SYSTEMS:   Review of Systems  Constitutional: Negative.  Negative for diaphoresis, fever, malaise/fatigue and weight loss.  Eyes:  Positive for double vision. Negative for blurred vision and pain.  Respiratory: Negative.  Negative for cough and shortness of breath.   Cardiovascular: Negative.  Negative for chest pain and leg swelling.  Gastrointestinal: Negative.  Negative for abdominal pain.  Genitourinary: Negative.  Negative for dysuria.  Musculoskeletal: Negative.  Negative for neck pain.  Skin: Negative.  Negative for rash.  Neurological: Negative.  Negative for sensory change, focal weakness, weakness and headaches.  Psychiatric/Behavioral: Negative.  The patient is not nervous/anxious.     As per HPI. Otherwise, a complete review of systems is negative.  PAST MEDICAL HISTORY: Past Medical History:  Diagnosis Date   Follicular lymphoma (HCC) 2009   Patient had chemo + rad tx's and zevaline  shots./ no flare ups since 2015   GERD (gastroesophageal reflux disease)    Heart murmur    past finding by Dr Judithann Sheen   Hemorrhoids    Hypercholesterolemia    Hypertension    started as a preventative   Seasonal allergies    Sleep apnea    CPAP   Squamous cell carcinoma of skin 02/10/2021   Left hand base of hand - [facility]   Transfusion history    Vertigo 5 yrs ago   positional vertigo    PAST SURGICAL HISTORY: Hemorrhoidectomy.  FAMILY HISTORY: Colon cancer and lung cancer.     ADVANCED DIRECTIVES:    HEALTH MAINTENANCE: Social History   Tobacco Use   Smoking status: Never   Smokeless tobacco: Never  Substance Use Topics   Alcohol use: Yes    Alcohol/week: 2.0 standard drinks of alcohol    Types: 2 Cans of beer per week    Comment: occasional   Drug use: No     Allergies  Allergen Reactions   Rituximab-Pvvr Other (See Comments)    Patient had a hypersensitivity to Rituximab. See progress note on 01/13/23. Patient able to complete infusion.   Tape Rash    bandaids    Current Outpatient Medications  Medication Sig Dispense Refill   alfuzosin (UROXATRAL) 10 MG 24 hr tablet Take 10 mg by mouth daily.     amLODipine (NORVASC) 5 MG tablet Take 5 mg by mouth daily.      aspirin EC 81 MG tablet Take 81 mg by mouth daily.      atorvastatin (LIPITOR) 10 MG tablet Take 10 mg by mouth daily at 6 PM.  Calcium Carbonate-Vitamin D 600-400 MG-UNIT per tablet Take 1 tablet by mouth daily.      cetirizine (ZYRTEC) 10 MG tablet Take 10 mg by mouth daily.     Multiple Vitamin (MULTI-VITAMINS) TABS Take 1 tablet by mouth daily.      omeprazole (PRILOSEC) 40 MG capsule Take 40 mg by mouth daily.      tadalafil (CIALIS) 20 MG tablet Take by mouth daily as needed.      tamsulosin (FLOMAX) 0.4 MG CAPS capsule Take 1 capsule by mouth daily.     No current facility-administered medications for this visit.    OBJECTIVE: Vitals:   06/09/23 0920  BP: 125/71  Pulse: 88  Resp:  16  Temp: (!) 97.3 F (36.3 C)  SpO2: 96%       Body mass index is 31.41 kg/m.    ECOG FS:0 - Asymptomatic  General: Well-developed, well-nourished, no acute distress. Eyes: Pink conjunctiva, anicteric sclera. HEENT: Normocephalic, moist mucous membranes. Lungs: No audible wheezing or coughing. Heart: Regular rate and rhythm. Abdomen: Soft, nontender, no obvious distention. Musculoskeletal: No edema, cyanosis, or clubbing. Neuro: Alert, answering all questions appropriately. Cranial nerves grossly intact. Skin: No rashes or petechiae noted. Psych: Normal affect. Lymphatics: Easily palpable left axillary lymph node, approximately 2 to 3 cm in size.  LAB RESULTS:  Lab Results  Component Value Date   NA 135 06/09/2023   K 3.0 (L) 06/09/2023   CL 103 06/09/2023   CO2 25 06/09/2023   GLUCOSE 120 (H) 06/09/2023   BUN 9 06/09/2023   CREATININE 0.86 06/09/2023   CALCIUM 8.3 (L) 06/09/2023   PROT 5.7 (L) 06/09/2023   ALBUMIN 3.7 06/09/2023   AST 37 06/09/2023   ALT 32 06/09/2023   ALKPHOS 53 06/09/2023   BILITOT 0.9 06/09/2023   GFRNONAA >60 06/09/2023   GFRAA >60 12/21/2019    Lab Results  Component Value Date   WBC 4.5 06/09/2023   NEUTROABS 3.0 06/09/2023   HGB 12.7 (L) 06/09/2023   HCT 37.2 (L) 06/09/2023   MCV 86.5 06/09/2023   PLT 163 06/09/2023     STUDIES: No results found.    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  Imaging with CT scan on December 28, 2022 reviewed independently with continued progression of disease.  Patient also had a mild pancytopenia and increased fatigue.  He agreed to weekly Rituxan x 4 for control of disease.  Patient completed 4 of treatment on February 03, 2023.  Repeat imaging on March 18, 2023 reviewed independently and reported as above with a mixed response  to therapy.  Because of this, patient has agreed to maintenance Rituxan every 8 weeks for a total of 2 years.  Proceed with cycle 2 of maintenance treatment today.  Return to clinic in 8 weeks for further evaluation and consideration of cycle 3.  Axillary lymphadenopathy: Monitor.  Consider repeat imaging in the future if lymph node enlarges.  Anemia: Essentially resolved.  Patient has a mildly reduced total iron and iron saturation ratio and may benefit from IV Venofer in the future if his hemoglobin trends down.  Thrombocytopenia: Resolved. Reaction to Rituxan: Likely rate based.  Additional premeds have been added for the remainder of his treatments.  I spent a total of 30 minutes reviewing chart data, face-to-face evaluation with the patient, counseling and coordination  of care as detailed above.   Patient expressed understanding and was in agreement with this plan. He also understands that He can call clinic at any time with any questions, concerns, or complaints.    Jeralyn Ruths, MD   06/09/2023 9:48 AM

## 2023-08-03 ENCOUNTER — Other Ambulatory Visit: Payer: Self-pay | Admitting: *Deleted

## 2023-08-03 DIAGNOSIS — C8218 Follicular lymphoma grade II, lymph nodes of multiple sites: Secondary | ICD-10-CM

## 2023-08-04 ENCOUNTER — Encounter: Payer: Self-pay | Admitting: Oncology

## 2023-08-04 ENCOUNTER — Inpatient Hospital Stay

## 2023-08-04 ENCOUNTER — Inpatient Hospital Stay: Attending: Oncology

## 2023-08-04 ENCOUNTER — Other Ambulatory Visit: Payer: Self-pay | Admitting: Oncology

## 2023-08-04 ENCOUNTER — Ambulatory Visit: Admitting: Oncology

## 2023-08-04 ENCOUNTER — Other Ambulatory Visit

## 2023-08-04 ENCOUNTER — Inpatient Hospital Stay (HOSPITAL_BASED_OUTPATIENT_CLINIC_OR_DEPARTMENT_OTHER): Admitting: Oncology

## 2023-08-04 VITALS — BP 131/64 | HR 80 | Temp 96.5°F | Resp 16 | Ht 68.5 in | Wt 211.0 lb

## 2023-08-04 VITALS — BP 134/68 | HR 77 | Temp 97.4°F | Resp 18

## 2023-08-04 DIAGNOSIS — C821A Follicular lymphoma grade II, in remission: Secondary | ICD-10-CM | POA: Diagnosis present

## 2023-08-04 DIAGNOSIS — Z79620 Long term (current) use of immunosuppressive biologic: Secondary | ICD-10-CM | POA: Diagnosis not present

## 2023-08-04 DIAGNOSIS — Z5112 Encounter for antineoplastic immunotherapy: Secondary | ICD-10-CM | POA: Diagnosis present

## 2023-08-04 DIAGNOSIS — C8218 Follicular lymphoma grade II, lymph nodes of multiple sites: Secondary | ICD-10-CM

## 2023-08-04 LAB — CBC WITH DIFFERENTIAL (CANCER CENTER ONLY)
Abs Immature Granulocytes: 0.01 10*3/uL (ref 0.00–0.07)
Basophils Absolute: 0 10*3/uL (ref 0.0–0.1)
Basophils Relative: 1 %
Eosinophils Absolute: 0.1 10*3/uL (ref 0.0–0.5)
Eosinophils Relative: 1 %
HCT: 39.1 % (ref 39.0–52.0)
Hemoglobin: 13.2 g/dL (ref 13.0–17.0)
Immature Granulocytes: 0 %
Lymphocytes Relative: 18 %
Lymphs Abs: 0.9 10*3/uL (ref 0.7–4.0)
MCH: 30.3 pg (ref 26.0–34.0)
MCHC: 33.8 g/dL (ref 30.0–36.0)
MCV: 89.9 fL (ref 80.0–100.0)
Monocytes Absolute: 0.6 10*3/uL (ref 0.1–1.0)
Monocytes Relative: 13 %
Neutro Abs: 3.3 10*3/uL (ref 1.7–7.7)
Neutrophils Relative %: 67 %
Platelet Count: 145 10*3/uL — ABNORMAL LOW (ref 150–400)
RBC: 4.35 MIL/uL (ref 4.22–5.81)
RDW: 13.8 % (ref 11.5–15.5)
WBC Count: 5 10*3/uL (ref 4.0–10.5)
nRBC: 0 % (ref 0.0–0.2)

## 2023-08-04 LAB — CMP (CANCER CENTER ONLY)
ALT: 19 U/L (ref 0–44)
AST: 28 U/L (ref 15–41)
Albumin: 4 g/dL (ref 3.5–5.0)
Alkaline Phosphatase: 65 U/L (ref 38–126)
Anion gap: 8 (ref 5–15)
BUN: 11 mg/dL (ref 8–23)
CO2: 25 mmol/L (ref 22–32)
Calcium: 8.5 mg/dL — ABNORMAL LOW (ref 8.9–10.3)
Chloride: 106 mmol/L (ref 98–111)
Creatinine: 0.98 mg/dL (ref 0.61–1.24)
GFR, Estimated: >60 mL/min (ref >60)
Glucose, Bld: 149 mg/dL — ABNORMAL HIGH (ref 70–99)
Potassium: 3.5 mmol/L (ref 3.5–5.1)
Sodium: 139 mmol/L (ref 135–145)
Total Bilirubin: 0.8 mg/dL (ref 0.0–1.2)
Total Protein: 6 g/dL — ABNORMAL LOW (ref 6.5–8.1)

## 2023-08-04 MED ORDER — HEPARIN SOD (PORK) LOCK FLUSH 100 UNIT/ML IV SOLN
500.0000 [IU] | Freq: Once | INTRAVENOUS | Status: AC | PRN
  Administered 2023-08-04: 500 [IU]
  Filled 2023-08-04: qty 5

## 2023-08-04 MED ORDER — DEXAMETHASONE SODIUM PHOSPHATE 10 MG/ML IJ SOLN
10.0000 mg | Freq: Once | INTRAMUSCULAR | Status: AC
  Administered 2023-08-04: 10 mg via INTRAVENOUS
  Filled 2023-08-04: qty 1

## 2023-08-04 MED ORDER — FAMOTIDINE IN NACL 20-0.9 MG/50ML-% IV SOLN
20.0000 mg | Freq: Once | INTRAVENOUS | Status: AC
  Administered 2023-08-04: 20 mg via INTRAVENOUS
  Filled 2023-08-04: qty 50

## 2023-08-04 MED ORDER — SODIUM CHLORIDE 0.9 % IV SOLN
375.0000 mg/m2 | Freq: Once | INTRAVENOUS | Status: AC
  Administered 2023-08-04: 800 mg via INTRAVENOUS
  Filled 2023-08-04: qty 30

## 2023-08-04 MED ORDER — ACETAMINOPHEN 325 MG PO TABS
650.0000 mg | ORAL_TABLET | Freq: Once | ORAL | Status: AC
  Administered 2023-08-04: 650 mg via ORAL
  Filled 2023-08-04: qty 2

## 2023-08-04 MED ORDER — SODIUM CHLORIDE 0.9 % IV SOLN
INTRAVENOUS | Status: DC
  Filled 2023-08-04 (×2): qty 250

## 2023-08-04 MED ORDER — DIPHENHYDRAMINE HCL 50 MG/ML IJ SOLN
25.0000 mg | Freq: Once | INTRAMUSCULAR | Status: AC
  Administered 2023-08-04: 25 mg via INTRAVENOUS
  Filled 2023-08-04: qty 1

## 2023-08-04 MED ORDER — SODIUM CHLORIDE 0.9% FLUSH
10.0000 mL | INTRAVENOUS | Status: DC | PRN
  Administered 2023-08-04: 10 mL
  Filled 2023-08-04: qty 10

## 2023-08-04 NOTE — Progress Notes (Signed)
 Wants to talk about oral iron. Was told by PCP to take OTC iron and does not feel like it agrees with him. Wants to know if it is necessary to take. Has noticed his right ear clogging and wonders if it is a swollen lymph node that could be causing it.

## 2023-08-04 NOTE — Patient Instructions (Signed)
 [facility] - A DEPT OF [facility]  Discharge Instructions: Thank you for choosing [facility] to provide your oncology and hematology care.  If you have a lab appointment with the Cancer Center, please go directly to the Cancer Center and check in at the registration area.  Wear comfortable clothing and clothing appropriate for easy access to any Portacath or PICC line.   We strive to give you quality time with your provider. You may need to reschedule your appointment if you arrive late (15 or more minutes).  Arriving late affects you and other patients whose appointments are after yours.  Also, if you miss three or more appointments without notifying the office, you may be dismissed from the clinic at the provider's discretion.      For prescription refill requests, have your pharmacy contact our office and allow 72 hours for refills to be completed.    Today you received the following chemotherapy and/or immunotherapy agents rituximab      To help prevent nausea and vomiting after your treatment, we encourage you to take your nausea medication as directed.  BELOW ARE SYMPTOMS THAT SHOULD BE REPORTED IMMEDIATELY: *FEVER GREATER THAN 100.4 F (38 C) OR HIGHER *CHILLS OR SWEATING *NAUSEA AND VOMITING THAT IS NOT CONTROLLED WITH YOUR NAUSEA MEDICATION *UNUSUAL SHORTNESS OF BREATH *UNUSUAL BRUISING OR BLEEDING *URINARY PROBLEMS (pain or burning when urinating, or frequent urination) *BOWEL PROBLEMS (unusual diarrhea, constipation, pain near the anus) TENDERNESS IN MOUTH AND THROAT WITH OR WITHOUT PRESENCE OF ULCERS (sore throat, sores in mouth, or a toothache) UNUSUAL RASH, SWELLING OR PAIN  UNUSUAL VAGINAL DISCHARGE OR ITCHING   Items with * indicate a potential emergency and should be followed up as soon as possible or go to the Emergency Department if any problems should occur.  Please show the CHEMOTHERAPY ALERT CARD or IMMUNOTHERAPY  ALERT CARD at check-in to the Emergency Department and triage nurse.  Should you have questions after your visit or need to cancel or reschedule your appointment, please contact [facility] - A DEPT OF [facility]  [phone] and follow the prompts.  Office hours are 8:00 a.m. to 4:30 p.m. Monday - Friday. Please note that voicemails left after 4:00 p.m. may not be returned until the following business day.  We are closed weekends and major holidays. You have access to a nurse at all times for urgent questions. Please call the main number to the clinic [phone] and follow the prompts.  For any non-urgent questions, you may also contact your provider using MyChart. We now offer e-Visits for anyone 87 and older to request care online for non-urgent symptoms. For details visit [email or web address].   Also download the MyChart app! Go to the app store, search "MyChart", open the app, select [facility], and log in with your MyChart username and password.

## 2023-08-04 NOTE — Progress Notes (Signed)
 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  The lymph node in his left axilla seems to be increasing in size.  Otherwise patient feels well and is tolerating his treatments without significant side effects.  He feels like he has fluid in his right ear and has an appointment with ENT later this week.  He does not complain of any weakness or fatigue.  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 He has no neurologic complaints.  He denies any fevers, weight loss, or night sweats. He denies any chest pain, shortness of breath, cough, or hemoptysis.  He denies any nausea, vomiting, constipation, or diarrhea.  He has no urinary complaints.  Patient offers no further specific complaints today.  REVIEW OF SYSTEMS:   Review of Systems  Constitutional: Negative.  Negative for diaphoresis, fever, malaise/fatigue and weight loss.  Eyes:  Positive for double vision. Negative for blurred vision and pain.  Respiratory: Negative.  Negative for cough and shortness of breath.   Cardiovascular: Negative.  Negative for chest pain and leg swelling.  Gastrointestinal: Negative.  Negative for abdominal pain.  Genitourinary: Negative.  Negative for dysuria.  Musculoskeletal: Negative.  Negative for neck pain.  Skin: Negative.  Negative for rash.  Neurological: Negative.  Negative for sensory change, focal weakness, weakness and headaches.  Psychiatric/Behavioral: Negative.  The patient is not nervous/anxious.     As per HPI. Otherwise, a complete  review of systems is negative.  PAST MEDICAL HISTORY: Past Medical History:  Diagnosis Date   Follicular lymphoma (HCC) 2009   Patient had chemo + rad tx's and zevaline shots./ no flare ups since 2015   GERD (gastroesophageal reflux disease)    Heart murmur    past finding by Dr Claudius Cumins   Hemorrhoids    Hypercholesterolemia    Hypertension    started as a preventative   Seasonal allergies    Sleep apnea    CPAP   Squamous cell carcinoma of skin 02/10/2021   Left hand base of hand - [facility]   Transfusion history    Vertigo 5 yrs ago   positional vertigo    PAST SURGICAL HISTORY: Hemorrhoidectomy.  FAMILY HISTORY: Colon cancer and lung cancer.     ADVANCED DIRECTIVES:    HEALTH MAINTENANCE: Social History   Tobacco Use   Smoking status: Never   Smokeless tobacco: Never  Substance Use Topics   Alcohol use: Yes    Alcohol/week: 2.0 standard drinks of alcohol    Types: 2 Cans of beer per week    Comment: occasional   Drug use: No     Allergies  Allergen Reactions   Rituximab -Pvvr Other (See Comments)    Patient had a hypersensitivity to Rituximab . See progress note on 01/13/23. Patient able to complete infusion.   Tape Rash    bandaids    Current Outpatient Medications  Medication Sig Dispense Refill   alfuzosin (UROXATRAL) 10 MG 24 hr tablet Take 10 mg by mouth daily.     amLODipine (NORVASC) 5 MG tablet Take 5 mg by mouth daily.      aspirin  EC 81 MG tablet Take 81 mg by mouth daily.      atorvastatin (LIPITOR) 10 MG tablet Take 10 mg by mouth daily at 6 PM.      Calcium Carbonate-Vitamin D 600-400 MG-UNIT per tablet Take 1 tablet by mouth daily.      cetirizine (ZYRTEC) 10 MG tablet Take 10 mg by mouth daily.     Multiple Vitamin (MULTI-VITAMINS) TABS Take 1 tablet by mouth daily.      omeprazole (PRILOSEC) 40 MG capsule Take 40 mg by mouth daily.      tadalafil (CIALIS) 20 MG tablet Take by mouth daily as needed.      tamsulosin (FLOMAX) 0.4 MG CAPS capsule  Take 1 capsule by mouth daily.     No current facility-administered medications for this visit.    OBJECTIVE: Vitals:   08/04/23 0902  BP: 131/64  Pulse: 80  Resp: 16  Temp: (!) 96.5 F (35.8 C)  SpO2: 98%       Body mass index is 31.62 kg/m.    ECOG FS:0 - Asymptomatic  General: Well-developed, well-nourished, no acute distress. Eyes: Pink conjunctiva, anicteric sclera. HEENT: Normocephalic, moist mucous membranes. Lungs: No audible wheezing or coughing. Heart: Regular rate and rhythm. Abdomen: Soft, nontender, no obvious distention. Musculoskeletal: No edema, cyanosis, or clubbing. Neuro: Alert, answering all questions appropriately. Cranial nerves grossly intact. Skin: No rashes or petechiae noted. Psych: Normal affect. Lymphatics: Easily palpable left axillary lymph node, approximately 2 to 3 cm in size.  Appears relatively unchanged.  LAB RESULTS:  Lab Results  Component Value Date   NA 139 08/04/2023   K 3.5 08/04/2023   CL 106 08/04/2023   CO2 25 08/04/2023   GLUCOSE 149 (H) 08/04/2023   BUN 11 08/04/2023   CREATININE 0.98 08/04/2023   CALCIUM 8.5 (L) 08/04/2023   PROT 6.0 (L) 08/04/2023   ALBUMIN 4.0 08/04/2023   AST 28 08/04/2023   ALT 19 08/04/2023   ALKPHOS 65 08/04/2023   BILITOT 0.8 08/04/2023   GFRNONAA >60 08/04/2023   GFRAA >60 12/21/2019    Lab Results  Component Value Date   WBC 5.0 08/04/2023   NEUTROABS 3.3 08/04/2023   HGB 13.2 08/04/2023   HCT 39.1 08/04/2023   MCV 89.9 08/04/2023   PLT 145 (L) 08/04/2023     STUDIES: No results found.    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  Imaging with CT scan on December 28, 2022 reviewed independently with continued progression of disease.  Patient also had a mild pancytopenia and increased fatigue.  He agreed to  weekly Rituxan  x 4 for control of disease.  Patient completed week 4 of treatment on February 03, 2023.  Repeat imaging on March 18, 2023 reviewed independently with a mixed response to therapy.  Because of this, patient has agreed to maintenance Rituxan  every 8 weeks for a total of 2 years.  Proceed with cycle 3 of maintenance treatment today.  Given his enlarging left axillary lymph node, will reimage in the next 1 to 2 weeks.  Return to again 8 weeks for further evaluation and consideration of cycle 4.   Axillary lymphadenopathy: Reimage as above.  If lymph node continues to increase, can consider XRT.   Anemia: Resolved.  Will repeat iron stores at next clinic visit.   Thrombocytopenia: Mild, monitor. Reaction  to Rituxan : Likely rate based.  Additional premeds have been added for the remainder of his treatments.    Patient expressed understanding and was in agreement with this plan. He also understands that He can call clinic at any time with any questions, concerns, or complaints.    Shellie Dials, MD   08/04/2023 9:17 AM

## 2023-08-18 ENCOUNTER — Ambulatory Visit
Admission: RE | Admit: 2023-08-18 | Discharge: 2023-08-18 | Disposition: A | Discharge status: Home / Self Care | Source: Ambulatory Visit | Attending: Oncology | Admitting: Oncology

## 2023-08-18 DIAGNOSIS — C8218 Follicular lymphoma grade II, lymph nodes of multiple sites: Secondary | ICD-10-CM | POA: Diagnosis present

## 2023-08-18 MED ORDER — SODIUM CHLORIDE 0.9 % IV SOLN: INTRAVENOUS | Status: DC

## 2023-08-18 MED ORDER — HEPARIN SOD (PORK) LOCK FLUSH 100 UNIT/ML IV SOLN
500.0000 [IU] | Freq: Once | INTRAVENOUS | Status: AC
  Administered 2023-08-18: 500 [IU] via INTRAVENOUS

## 2023-08-18 MED ORDER — IOHEXOL 300 MG/ML  SOLN
100.0000 mL | Freq: Once | INTRAMUSCULAR | Status: AC | PRN
  Administered 2023-08-18: 100 mL via INTRAVENOUS

## 2023-08-18 MED ORDER — BARIUM SULFATE 2 % PO SUSP
450.0000 mL | ORAL | Status: AC
  Administered 2023-08-18 (×2): 450 mL via ORAL

## 2023-08-24 ENCOUNTER — Other Ambulatory Visit: Payer: Self-pay

## 2023-08-25 ENCOUNTER — Ambulatory Visit
Admission: EM | Admit: 2023-08-25 | Discharge: 2023-08-25 | Disposition: A | Discharge status: Home / Self Care | Attending: Emergency Medicine | Admitting: Emergency Medicine

## 2023-08-25 ENCOUNTER — Encounter: Payer: Self-pay | Admitting: Emergency Medicine

## 2023-08-25 DIAGNOSIS — S61411A Laceration without foreign body of right hand, initial encounter: Secondary | ICD-10-CM

## 2023-08-25 MED ORDER — MUPIROCIN 2 % EX OINT: 1.0000 | TOPICAL_OINTMENT | Freq: Two times a day (BID) | CUTANEOUS | 0 refills | Status: AC

## 2023-08-25 NOTE — Discharge Instructions (Addendum)
 Use mupirocin and cover with telfa Follow up with PCP Return as needed

## 2023-08-25 NOTE — ED Provider Notes (Addendum)
 [facility]    CSN: [id number] Arrival date & time: 08/25/23  1417      History   Chief Complaint Chief Complaint  Patient presents with   Hand Injury    HPI Sean Raymond is a 78 y.o. male.   78 year old male patient, Sean Raymond, presents to urgent care for evaluation of right hand injury 2 weeks prior.  Patient states he had a skin tear on his right hand and he does not feel like it is healed completely.  Patient has been applying gauze dressings and feels like it is not healing well.  Patient has a large healing wound to dorsal aspect of right hand noted, no purulent drainage or erythema noted.  Patient states his tetanus is up-to-date, patient is right-handed.  Pt has been treating with peroxide,alcohol and OTC abx cream  The history is provided by the patient.    Past Medical History:  Diagnosis Date   Follicular lymphoma (HCC) 2009   Patient had chemo + rad tx's and zevaline shots./ no flare ups since 2015   GERD (gastroesophageal reflux disease)    Heart murmur    past finding by Dr Claudius Cumins   Hemorrhoids    Hypercholesterolemia    Hypertension    started as a preventative   Seasonal allergies    Sleep apnea    CPAP   Squamous cell carcinoma of skin 02/10/2021   Left hand base of hand - [facility]   Transfusion history    Vertigo 5 yrs ago   positional vertigo    Patient Active Problem List   Diagnosis Date Noted   Skin tear of right hand without complication 08/25/2023   Follicular lymphoma grade II (HCC) 01/05/2023   BPH (benign prostatic hyperplasia) 12/29/2018   Colon polyps 12/29/2018   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   Essential (primary) hypertension 08/22/2013   GERD (gastroesophageal reflux disease) 08/22/2013   Hyperlipidemia 08/22/2013   Lymphoma in remission 08/22/2013   Sleep apnea 08/22/2013    Past Surgical History:  Procedure Laterality Date   CATARACT EXTRACTION Right    CATARACT EXTRACTION W/PHACO Left 02/14/2020    Procedure: CATARACT EXTRACTION PHACO AND INTRAOCULAR LENS PLACEMENT (IOC) LEFT 4.74 01:03.1 7.5%;  Surgeon: Annell Kidney, MD;  Location: [facility];  Service: Ophthalmology;  Laterality: Left;   COLONOSCOPY     HEMORROIDECTOMY     LYMPH NODE BIOPSY     TONSILLECTOMY         Home Medications    Prior to Admission medications   Medication Sig Start Date End Date Taking? Authorizing Provider  mupirocin ointment (BACTROBAN) 2 % Apply 1 Application topically 2 (two) times daily for 7 days. Right hand 08/25/23 09/01/23 Yes Marli Diego, Eveleen Hinds, NP  alfuzosin (UROXATRAL) 10 MG 24 hr tablet Take 10 mg by mouth daily.    [provider]  amLODipine (NORVASC) 5 MG tablet Take 5 mg by mouth daily.  08/16/14   [provider]  aspirin EC 81 MG tablet Take 81 mg by mouth daily.     [provider]  atorvastatin (LIPITOR) 10 MG tablet Take 10 mg by mouth daily at 6 PM.  08/23/14   [provider]  Calcium Carbonate-Vitamin D 600-400 MG-UNIT per tablet Take 1 tablet by mouth daily.     [provider]  cetirizine (ZYRTEC) 10 MG tablet Take 10 mg by mouth daily.    [provider]  Multiple Vitamin (MULTI-VITAMINS) TABS Take 1 tablet by mouth daily.  [provider]  omeprazole (PRILOSEC) 40 MG capsule Take 40 mg by mouth daily.  08/23/14   [provider]  tadalafil (CIALIS) 20 MG tablet Take by mouth daily as needed.  11/17/17   [provider]  tamsulosin (FLOMAX) 0.4 MG CAPS capsule Take 1 capsule by mouth daily. 05/15/18   [provider]    Family History No family history on file.  Social History Social History   Tobacco Use   Smoking status: Never   Smokeless tobacco: Never  Vaping Use   Vaping status: Never Used  Substance Use Topics   Alcohol use: Yes    Alcohol/week: 2.0 standard drinks of alcohol    Types: 2 Cans of beer per week    Comment: occasional   Drug use: No      Allergies   Rituximab -pvvr and Tape   Review of Systems Review of Systems  Constitutional:  Negative for fever.  Skin:  Positive for wound.  All other systems reviewed and are negative.    Physical Exam Triage Vital Signs ED Triage Vitals  Encounter Vitals Group     BP      Systolic BP Percentile      Diastolic BP Percentile      Pulse      Resp      Temp      Temp src      SpO2      Weight      Height      Head Circumference      Peak Flow      Pain Score      Pain Loc      Pain Education      Exclude from Growth Chart    No data found.  Updated Vital Signs BP 126/76 (BP Location: Right Arm)   Pulse 84   Temp 98.3 F (36.8 C) (Oral)   Resp 18   SpO2 95%   Visual Acuity Right Eye Distance:   Left Eye Distance:   Bilateral Distance:    Right Eye Near:   Left Eye Near:    Bilateral Near:     Physical Exam Vitals and nursing note reviewed.  Skin:    Findings: Wound present.     Comments: Right dorsal hand ~ 2X2 CM healing wound noted, no drainage, fluctuance, pt has full ROM, radial +2, good distal movement and sensation.   Dressing(telfa) applied to right hand with coban.      UC Treatments / Results  Labs (all labs ordered are listed, but only abnormal results are displayed) Labs Reviewed - No data to display  EKG   Radiology No results found.  Procedures Procedures (including critical care time)  Medications Ordered in UC Medications - No data to display  Initial Impression / Assessment and Plan / UC Course  I have reviewed the triage vital signs and the nursing notes.  Pertinent labs & imaging results that were available during my care of the patient were reviewed by me and considered in my medical decision making (see chart for details).    Discussed exam findings and plan of care with patient, strict go to ER precautions given , patient verbalized understanding to this provider.  Ddx: Wound check, skin tear Final  Clinical Impressions(s) / UC Diagnoses   Final diagnoses:  Skin tear of right hand without complication, initial encounter     Discharge Instructions      Use mupirocin and cover with telfa Follow up  with PCP Return as needed   ED Prescriptions     Medication Sig Dispense Auth. Provider   mupirocin ointment (BACTROBAN) 2 % Apply 1 Application topically 2 (two) times daily for 7 days. Right hand 15 g Denice Cardon, Eveleen Hinds, NP      PDMP not reviewed this encounter.   Peter Brands, NP 08/25/23 4782    Peter Brands, NP 08/25/23 2049

## 2023-08-25 NOTE — ED Triage Notes (Signed)
 Pt slipped going into his home 2 weeks ago and has a skin tear to his right hand that is not improving.

## 2023-09-15 ENCOUNTER — Other Ambulatory Visit: Payer: Self-pay | Admitting: Unknown Physician Specialty

## 2023-09-15 DIAGNOSIS — D3705 Neoplasm of uncertain behavior of pharynx: Secondary | ICD-10-CM

## 2023-09-23 ENCOUNTER — Ambulatory Visit
Admission: RE | Admit: 2023-09-23 | Discharge: 2023-09-23 | Disposition: A | Discharge status: Home / Self Care | Source: Ambulatory Visit | Attending: Unknown Physician Specialty | Admitting: Unknown Physician Specialty

## 2023-09-23 DIAGNOSIS — D3705 Neoplasm of uncertain behavior of pharynx: Secondary | ICD-10-CM

## 2023-09-23 MED ORDER — GADOPICLENOL 0.5 MMOL/ML IV SOLN
10.0000 mL | Freq: Once | INTRAVENOUS | Status: AC | PRN
  Administered 2023-09-23: 10 mL via INTRAVENOUS

## 2023-09-28 ENCOUNTER — Encounter: Payer: Self-pay | Admitting: Oncology

## 2023-09-29 ENCOUNTER — Encounter: Payer: Self-pay | Admitting: Oncology

## 2023-09-29 ENCOUNTER — Inpatient Hospital Stay (HOSPITAL_BASED_OUTPATIENT_CLINIC_OR_DEPARTMENT_OTHER): Admitting: Oncology

## 2023-09-29 ENCOUNTER — Inpatient Hospital Stay: Attending: Oncology

## 2023-09-29 ENCOUNTER — Inpatient Hospital Stay

## 2023-09-29 VITALS — BP 132/77 | HR 78 | Resp 18

## 2023-09-29 VITALS — BP 121/66 | HR 84 | Temp 97.7°F | Resp 16 | Wt 210.0 lb

## 2023-09-29 DIAGNOSIS — C821A Follicular lymphoma grade II, in remission: Secondary | ICD-10-CM | POA: Diagnosis present

## 2023-09-29 DIAGNOSIS — Z79620 Long term (current) use of immunosuppressive biologic: Secondary | ICD-10-CM | POA: Diagnosis not present

## 2023-09-29 DIAGNOSIS — C8218 Follicular lymphoma grade II, lymph nodes of multiple sites: Secondary | ICD-10-CM

## 2023-09-29 DIAGNOSIS — Z5112 Encounter for antineoplastic immunotherapy: Secondary | ICD-10-CM | POA: Insufficient documentation

## 2023-09-29 LAB — CBC WITH DIFFERENTIAL (CANCER CENTER ONLY)
Abs Immature Granulocytes: 0.02 10*3/uL (ref 0.00–0.07)
Basophils Absolute: 0.1 10*3/uL (ref 0.0–0.1)
Basophils Relative: 1 %
Eosinophils Absolute: 0.1 10*3/uL (ref 0.0–0.5)
Eosinophils Relative: 2 %
HCT: 36.6 % — ABNORMAL LOW (ref 39.0–52.0)
Hemoglobin: 12.3 g/dL — ABNORMAL LOW (ref 13.0–17.0)
Immature Granulocytes: 0 %
Lymphocytes Relative: 20 %
Lymphs Abs: 1 10*3/uL (ref 0.7–4.0)
MCH: 30.1 pg (ref 26.0–34.0)
MCHC: 33.6 g/dL (ref 30.0–36.0)
MCV: 89.7 fL (ref 80.0–100.0)
Monocytes Absolute: 0.6 10*3/uL (ref 0.1–1.0)
Monocytes Relative: 13 %
Neutro Abs: 3.1 10*3/uL (ref 1.7–7.7)
Neutrophils Relative %: 64 %
Platelet Count: 145 10*3/uL — ABNORMAL LOW (ref 150–400)
RBC: 4.08 MIL/uL — ABNORMAL LOW (ref 4.22–5.81)
RDW: 13.5 % (ref 11.5–15.5)
WBC Count: 4.8 10*3/uL (ref 4.0–10.5)
nRBC: 0 % (ref 0.0–0.2)

## 2023-09-29 LAB — IRON AND TIBC
Iron: 54 ug/dL (ref 45–182)
Saturation Ratios: 17 % — ABNORMAL LOW (ref 17.9–39.5)
TIBC: 328 ug/dL (ref 250–450)
UIBC: 274 ug/dL

## 2023-09-29 LAB — COMPREHENSIVE METABOLIC PANEL WITH GFR
ALT: 13 U/L (ref 0–44)
AST: 27 U/L (ref 15–41)
Albumin: 3.8 g/dL (ref 3.5–5.0)
Alkaline Phosphatase: 64 U/L (ref 38–126)
Anion gap: 8 (ref 5–15)
BUN: 12 mg/dL (ref 8–23)
CO2: 24 mmol/L (ref 22–32)
Calcium: 8.5 mg/dL — ABNORMAL LOW (ref 8.9–10.3)
Chloride: 106 mmol/L (ref 98–111)
Creatinine, Ser: 0.93 mg/dL (ref 0.61–1.24)
GFR, Estimated: >60 mL/min (ref >60)
Glucose, Bld: 135 mg/dL — ABNORMAL HIGH (ref 70–99)
Potassium: 3.7 mmol/L (ref 3.5–5.1)
Sodium: 138 mmol/L (ref 135–145)
Total Bilirubin: 1 mg/dL (ref 0.0–1.2)
Total Protein: 5.8 g/dL — ABNORMAL LOW (ref 6.5–8.1)

## 2023-09-29 LAB — FERRITIN: Ferritin: 56 ng/mL (ref 24–336)

## 2023-09-29 MED ORDER — DIPHENHYDRAMINE HCL 50 MG/ML IJ SOLN
25.0000 mg | Freq: Once | INTRAMUSCULAR | Status: AC
  Administered 2023-09-29: 25 mg via INTRAVENOUS
  Filled 2023-09-29: qty 1

## 2023-09-29 MED ORDER — SODIUM CHLORIDE 0.9 % IV SOLN
375.0000 mg/m2 | Freq: Once | INTRAVENOUS | Status: AC
  Administered 2023-09-29: 800 mg via INTRAVENOUS
  Filled 2023-09-29: qty 30

## 2023-09-29 MED ORDER — SODIUM CHLORIDE 0.9 % IV SOLN
INTRAVENOUS | Status: DC
  Filled 2023-09-29: qty 250

## 2023-09-29 MED ORDER — ACETAMINOPHEN 325 MG PO TABS
650.0000 mg | ORAL_TABLET | Freq: Once | ORAL | Status: AC
  Administered 2023-09-29: 650 mg via ORAL
  Filled 2023-09-29: qty 2

## 2023-09-29 MED ORDER — HEPARIN SOD (PORK) LOCK FLUSH 100 UNIT/ML IV SOLN
500.0000 [IU] | Freq: Once | INTRAVENOUS | Status: AC | PRN
  Administered 2023-09-29: 500 [IU]
  Filled 2023-09-29: qty 5

## 2023-09-29 MED ORDER — FAMOTIDINE IN NACL 20-0.9 MG/50ML-% IV SOLN
20.0000 mg | Freq: Once | INTRAVENOUS | Status: AC
  Administered 2023-09-29: 20 mg via INTRAVENOUS
  Filled 2023-09-29: qty 50

## 2023-09-29 MED ORDER — SODIUM CHLORIDE 0.9% FLUSH
10.0000 mL | INTRAVENOUS | Status: DC | PRN
Start: 2023-09-29 — End: 2023-09-29
  Filled 2023-09-29: qty 10

## 2023-09-29 MED ORDER — DEXAMETHASONE SODIUM PHOSPHATE 10 MG/ML IJ SOLN
10.0000 mg | Freq: Once | INTRAMUSCULAR | Status: AC
  Administered 2023-09-29: 10 mg via INTRAVENOUS
  Filled 2023-09-29: qty 1

## 2023-09-29 NOTE — Patient Instructions (Signed)
 [facility] - A DEPT OF [facility]  Discharge Instructions: Thank you for choosing [facility] to provide your oncology and hematology care.  If you have a lab appointment with the Cancer Center, please go directly to the Cancer Center and check in at the registration area.  Wear comfortable clothing and clothing appropriate for easy access to any Portacath or PICC line.   We strive to give you quality time with your provider. You may need to reschedule your appointment if you arrive late (15 or more minutes).  Arriving late affects you and other patients whose appointments are after yours.  Also, if you miss three or more appointments without notifying the office, you may be dismissed from the clinic at the provider's discretion.      For prescription refill requests, have your pharmacy contact our office and allow 72 hours for refills to be completed.    Today you received the following chemotherapy and/or immunotherapy agents RITUXAN      To help prevent nausea and vomiting after your treatment, we encourage you to take your nausea medication as directed.  BELOW ARE SYMPTOMS THAT SHOULD BE REPORTED IMMEDIATELY: *FEVER GREATER THAN 100.4 F (38 C) OR HIGHER *CHILLS OR SWEATING *NAUSEA AND VOMITING THAT IS NOT CONTROLLED WITH YOUR NAUSEA MEDICATION *UNUSUAL SHORTNESS OF BREATH *UNUSUAL BRUISING OR BLEEDING *URINARY PROBLEMS (pain or burning when urinating, or frequent urination) *BOWEL PROBLEMS (unusual diarrhea, constipation, pain near the anus) TENDERNESS IN MOUTH AND THROAT WITH OR WITHOUT PRESENCE OF ULCERS (sore throat, sores in mouth, or a toothache) UNUSUAL RASH, SWELLING OR PAIN  UNUSUAL VAGINAL DISCHARGE OR ITCHING   Items with * indicate a potential emergency and should be followed up as soon as possible or go to the Emergency Department if any problems should occur.  Please show the CHEMOTHERAPY ALERT CARD or IMMUNOTHERAPY  ALERT CARD at check-in to the Emergency Department and triage nurse.  Should you have questions after your visit or need to cancel or reschedule your appointment, please contact [facility] - A DEPT OF [facility]  [phone] and follow the prompts.  Office hours are 8:00 a.m. to 4:30 p.m. Monday - Friday. Please note that voicemails left after 4:00 p.m. may not be returned until the following business day.  We are closed weekends and major holidays. You have access to a nurse at all times for urgent questions. Please call the main number to the clinic [phone] and follow the prompts.  For any non-urgent questions, you may also contact your provider using MyChart. We now offer e-Visits for anyone 71 and older to request care online for non-urgent symptoms. For details visit [email or web address].   Also download the MyChart app! Go to the app store, search "MyChart", open the app, select [place], and log in with your MyChart username and password.  Rituximab Injection What is this medication? RITUXIMAB (ri TUX i mab) treats leukemia and lymphoma. It works by blocking a protein that causes cancer cells to grow and multiply. This helps to slow or stop the spread of cancer cells. It may also be used to treat autoimmune conditions, such as arthritis. It works by slowing down an overactive immune system. It is a monoclonal antibody. This medicine may be used for other purposes; ask your health care provider or pharmacist if you have questions. COMMON BRAND NAME(S): RIABNI, Rituxan, RUXIENCE, truxima What should I tell my care  team before I take this medication? They need to know if you have any of these conditions: Chest pain Heart disease Immune system problems Infection, such as chickenpox, cold sores, hepatitis B, herpes Irregular heartbeat or rhythm Kidney disease Low blood counts, such as low white cells, platelets, red cells Lung disease Recent or  upcoming vaccine An unusual or allergic reaction to rituximab, other medications, foods, dyes, or preservatives Pregnant or trying to get pregnant Breast-feeding How should I use this medication? This medication is injected into a vein. It is given by a care team in a hospital or clinic setting. A special MedGuide will be given to you before each treatment. Be sure to read this information carefully each time. Talk to your care team about the use of this medication in children. While this medication may be prescribed for children as young as 6 months for selected conditions, precautions do apply. Overdosage: If you think you have taken too much of this medicine contact a poison control center or emergency room at once. NOTE: This medicine is only for you. Do not share this medicine with others. What if I miss a dose? Keep appointments for follow-up doses. It is important not to miss your dose. Call your care team if you are unable to keep an appointment. What may interact with this medication? Do not take this medication with any of the following: Live vaccines This medication may also interact with the following: Cisplatin This list may not describe all possible interactions. Give your health care provider a list of all the medicines, herbs, non-prescription drugs, or dietary supplements you use. Also tell them if you smoke, drink alcohol, or use illegal drugs. Some items may interact with your medicine. What should I watch for while using this medication? Your condition will be monitored carefully while you are receiving this medication. You may need blood work while taking this medication. This medication can cause serious infusion reactions. To reduce the risk your care team may give you other medications to take before receiving this one. Be sure to follow the directions from your care team. This medication may increase your risk of getting an infection. Call your care team for advice if  you get a fever, chills, sore throat, or other symptoms of a cold or flu. Do not treat yourself. Try to avoid being around people who are sick. Call your care team if you are around anyone with measles, chickenpox, or if you develop sores or blisters that do not heal properly. Avoid taking medications that contain aspirin, acetaminophen, ibuprofen, naproxen, or ketoprofen unless instructed by your care team. These medications may hide a fever. This medication may cause serious skin reactions. They can happen weeks to months after starting the medication. Contact your care team right away if you notice fevers or flu-like symptoms with a rash. The rash may be red or purple and then turn into blisters or peeling of the skin. You may also notice a red rash with swelling of the face, lips, or lymph nodes in your neck or under your arms. In some patients, this medication may cause a serious brain infection that may cause death. If you have any problems seeing, thinking, speaking, walking, or standing, tell your care team right away. If you cannot reach your care team, urgently seek another source of medical care. Talk to your care team if you may be pregnant. Serious birth defects can occur if you take this medication during pregnancy and for 12 months after the  last dose. You will need a negative pregnancy test before starting this medication. Contraception is recommended while taking this medication and for 12 months after the last dose. Your care team can help you find the option that works for you. Do not breastfeed while taking this medication and for at least 6 months after the last dose. What side effects may I notice from receiving this medication? Side effects that you should report to your care team as soon as possible: Allergic reactions or angioedema--skin rash, itching or hives, swelling of the face, eyes, lips, tongue, arms, or legs, trouble swallowing or breathing Bowel blockage--stomach cramping,  unable to have a bowel movement or pass gas, loss of appetite, vomiting Dizziness, loss of balance or coordination, confusion or trouble speaking Heart attack--pain or tightness in the chest, shoulders, arms, or jaw, nausea, shortness of breath, cold or clammy skin, feeling faint or lightheaded Heart rhythm changes--fast or irregular heartbeat, dizziness, feeling faint or lightheaded, chest pain, trouble breathing Infection--fever, chills, cough, sore throat, wounds that don't heal, pain or trouble when passing urine, general feeling of discomfort or being unwell Infusion reactions--chest pain, shortness of breath or trouble breathing, feeling faint or lightheaded Kidney injury--decrease in the amount of urine, swelling of the ankles, hands, or feet Liver injury--right upper belly pain, loss of appetite, nausea, light-colored stool, dark yellow or brown urine, yellowing skin or eyes, unusual weakness or fatigue Redness, blistering, peeling, or loosening of the skin, including inside the mouth Stomach pain that is severe, does not go away, or gets worse Tumor lysis syndrome (TLS)--nausea, vomiting, diarrhea, decrease in the amount of urine, dark urine, unusual weakness or fatigue, confusion, muscle pain or cramps, fast or irregular heartbeat, joint pain Side effects that usually do not require medical attention (report to your care team if they continue or are bothersome): Headache Joint pain Nausea Runny or stuffy nose Unusual weakness or fatigue This list may not describe all possible side effects. Call your doctor for medical advice about side effects. You may report side effects to FDA at 1-800-FDA-1088. Where should I keep my medication? This medication is given in a hospital or clinic. It will not be stored at home. NOTE: This sheet is a summary. It may not cover all possible information. If you have questions about this medicine, talk to your doctor, pharmacist, or health care provider.   2024 Elsevier/Gold Standard (2021-08-14 00:00:00)

## 2023-09-29 NOTE — Progress Notes (Signed)
 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  He recently had an MRI of the brain and the results are pending at time of dictation.  Patient reports he was on a steroid taper for this fluid in his ear at the time of his CT scan in May 2025.  He reports the lymph node in his axilla initially decreased in size while on steroids, but now is larger again.  He does not complain of any weakness or fatigue.  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 He has no other neurologic complaints.  He denies any fevers, weight loss, or night sweats. He denies any chest pain, shortness of breath, cough, or hemoptysis.  He denies any nausea, vomiting, constipation, or diarrhea.  He has no urinary complaints.  Patient offers no further specific complaints today.  REVIEW OF SYSTEMS:   Review of Systems  Constitutional: Negative.  Negative for diaphoresis, fever, malaise/fatigue and weight loss.  Eyes:  Positive for double vision. Negative for blurred vision and pain.  Respiratory: Negative.  Negative for cough and shortness of breath.   Cardiovascular: Negative.  Negative for chest pain and leg swelling.  Gastrointestinal: Negative.  Negative for abdominal pain.  Genitourinary: Negative.  Negative for dysuria.  Musculoskeletal: Negative.  Negative for neck pain.  Skin: Negative.  Negative for rash.  Neurological: Negative.  Negative for sensory  change, focal weakness, weakness and headaches.  Psychiatric/Behavioral: Negative.  The patient is not nervous/anxious.     As per HPI. Otherwise, a complete review of systems is negative.  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  FAMILY HISTORY: Colon cancer and lung cancer.     ADVANCED DIRECTIVES:    HEALTH MAINTENANCE: Social History   Tobacco Use   Smoking status: Never   Smokeless tobacco: Never  Vaping Use   Vaping status: Never Used  Substance Use Topics   Alcohol use: Yes    Alcohol/week: 2.0 standard drinks of alcohol    Types: 2 Cans of beer per week    Comment: occasional   Drug use: No     Allergies  Allergen Reactions   Rituximab -Pvvr Other (See Comments)    Patient had a hypersensitivity to Rituximab . See progress note on 01/13/23. Patient able to complete infusion.   Tape Rash    bandaids  Current Outpatient Medications  Medication Sig Dispense Refill   alfuzosin (UROXATRAL) 10 MG 24 hr tablet Take 10 mg by mouth daily.     amLODipine (NORVASC) 5 MG tablet Take 5 mg by mouth daily.      aspirin EC 81 MG tablet Take 81 mg by mouth daily.      atorvastatin (LIPITOR) 10 MG tablet Take 10 mg by mouth daily at 6 PM.      Calcium Carbonate-Vitamin D 600-400 MG-UNIT per tablet Take 1 tablet by mouth daily.      cetirizine (ZYRTEC) 10 MG tablet Take 10 mg by mouth daily.     Multiple Vitamin (MULTI-VITAMINS) TABS Take 1  tablet by mouth daily.      omeprazole (PRILOSEC) 40 MG capsule Take 40 mg by mouth daily.      tadalafil (CIALIS) 20 MG tablet Take by mouth daily as needed.      tamsulosin (FLOMAX) 0.4 MG CAPS capsule Take 1 capsule by mouth daily.     No current facility-administered medications for this visit.    OBJECTIVE: Vitals:   09/29/23 0917  BP: 121/66  Pulse: 84  Resp: 16  Temp: 97.7 F (36.5 C)  SpO2: 97%       Body mass index is 31.47 kg/m.    ECOG FS:0 - Asymptomatic  General: Well-developed, well-nourished, no acute distress. Eyes: Pink conjunctiva, anicteric sclera. HEENT: Normocephalic, moist mucous membranes. Lungs: No audible wheezing or coughing. Heart: Regular rate and rhythm. Abdomen: Soft, nontender, no obvious distention. Musculoskeletal: No edema, cyanosis, or clubbing. Neuro: Alert, answering all questions appropriately. Cranial nerves grossly intact. Skin: No rashes or petechiae noted. Psych: Normal affect. Lymphatics: Easily palpable left axillary lymph node, approximately 2 to 3 cm in size.  Appears relatively unchanged in size.  LAB RESULTS:  Lab Results  Component Value Date   NA 138 09/29/2023   K 3.7 09/29/2023   CL 106 09/29/2023   CO2 24 09/29/2023   GLUCOSE 135 (H) 09/29/2023   BUN 12 09/29/2023   CREATININE 0.93 09/29/2023   CALCIUM 8.5 (L) 09/29/2023   PROT 5.8 (L) 09/29/2023   ALBUMIN 3.8 09/29/2023   AST 27 09/29/2023   ALT 13 09/29/2023   ALKPHOS 64 09/29/2023   BILITOT 1.0 09/29/2023   GFRNONAA >60 09/29/2023   GFRAA >60 12/21/2019    Lab Results  Component Value Date   WBC 4.8 09/29/2023   NEUTROABS 3.1 09/29/2023   HGB 12.3 (L) 09/29/2023   HCT 36.6 (L) 09/29/2023   MCV 89.7 09/29/2023   PLT 145 (L) 09/29/2023     STUDIES: No results found.    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  Imaging with CT scan on December 28, 2022 reviewed independently with continued progression of disease.  Patient also had a mild pancytopenia and increased fatigue.  He agreed to weekly Rituxan  x 4 for control of disease.  Patient completed week 4 of treatment on February 03, 2023.  Repeat imaging on March 18, 2023 reviewed independently with a mixed response to therapy.  Because of this, patient agreed to maintenance Rituxan  every 8 weeks for a total of 2 years.  Repeat CT scan on Aug 18, 2023 reviewed independently with mixed response to therapy, patient states he was on steroid taper for the fluid in his ear at the  time of the CT scan therefore results may be difficult to interpret.  Proceed with cycle 4 of maintenance treatment today.  Return to clinic in 8 weeks for further evaluation and consideration of cycle 5. Axillary lymphadenopathy: Reimaging as above.  Patient reports his lymphadenopathy decreased in size while on steroids, but then started to get bigger once he completed his taper.  He does not require XRT at this time, but will consider in future if necessary.   Anemia: Mild.  Patient has a mildly decreased iron saturation ratio, otherwise his iron panel is within normal limits. Thrombocytopenia: Chronic and unchanged. Reaction to Rituxan : Likely rate based.  Additional premeds have been added for the remainder of his treatments. Hearing loss/fluid: MRI results pending.  Continue follow-up with ENT as scheduled.    Patient expressed understanding and was in agreement with this plan. He also understands that He can call clinic at any time with any questions, concerns, or complaints.    Evalene JINNY Reusing, MD   09/29/2023 9:40 AM

## 2023-10-13 ENCOUNTER — Other Ambulatory Visit: Payer: Self-pay

## 2023-10-13 ENCOUNTER — Other Ambulatory Visit: Payer: Self-pay | Admitting: Unknown Physician Specialty

## 2023-10-13 ENCOUNTER — Encounter: Payer: Self-pay | Admitting: Oncology

## 2023-10-13 MED ORDER — CIPROFLOXACIN-DEXAMETHASONE 0.3-0.1 % OT SUSP
4.0000 [drp] | Freq: Two times a day (BID) | OTIC | 0 refills | Status: DC
  Filled 2023-10-13: qty 7.5, 5d supply, fill #0

## 2023-10-14 ENCOUNTER — Other Ambulatory Visit: Payer: Self-pay

## 2023-10-14 ENCOUNTER — Encounter: Payer: Self-pay | Admitting: Unknown Physician Specialty

## 2023-10-19 NOTE — Anesthesia Preprocedure Evaluation (Signed)
 Anesthesia Evaluation  Patient identified by MRN, date of birth, ID band Patient awake    Reviewed: Allergy & Precautions, H&P , NPO status , Patient's Chart, lab work & pertinent test results  Airway Mallampati: IV  TM Distance: <3 FB Neck ROM: Full    Dental no notable dental hx. (+) Poor Dentition, Missing, Chipped Has upper partial that he didn't wear today.:   Pulmonary neg pulmonary ROS, sleep apnea    Pulmonary exam normal breath sounds clear to auscultation       Cardiovascular hypertension, Normal cardiovascular exam+ Valvular Problems/Murmurs  Rhythm:Regular Rate:Normal     Neuro/Psych negative neurological ROS  negative psych ROS   GI/Hepatic negative GI ROS, Neg liver ROS,GERD  ,,  Endo/Other  negative endocrine ROS    Renal/GU negative Renal ROS  negative genitourinary   Musculoskeletal negative musculoskeletal ROS (+)    Abdominal   Peds negative pediatric ROS (+)  Hematology negative hematology ROS (+)   Anesthesia Other Findings GERD (gastroesophageal reflux disease) Hemorrhoids Hypercholesterolemia  Transfusion history Hypertension  Heart murmur Seasonal allergies  Sleep apnea uses CPAP nightly Vertigo  Follicular lymphoma (HCC) Squamous cell carcinoma of skin     Reproductive/Obstetrics negative OB ROS                              Anesthesia Physical Anesthesia Plan  ASA: 3  Anesthesia Plan: MAC   Post-op Pain Management:    Induction: Intravenous  PONV Risk Score and Plan:   Airway Management Planned: Natural Airway and Nasal Cannula  Additional Equipment:   Intra-op Plan:   Post-operative Plan:   Informed Consent: I have reviewed the patients History and Physical, chart, labs and discussed the procedure including the risks, benefits and alternatives for the proposed anesthesia with the patient or authorized representative who has indicated  his/her understanding and acceptance.     Dental Advisory Given  Plan Discussed with: Anesthesiologist, CRNA and Surgeon  Anesthesia Plan Comments: (Patient consented for risks of anesthesia including but not limited to:  - adverse reactions to medications - damage to eyes, teeth, lips or other oral mucosa - nerve damage due to positioning  - sore throat or hoarseness - Damage to heart, brain, nerves, lungs, other parts of body or loss of life  Patient voiced understanding and assent.)         Anesthesia Quick Evaluation

## 2023-10-19 NOTE — Discharge Instructions (Signed)
 [facility] DISCHARGE INSTRUCTIONS FOR MYRINGOTOMY AND TUBE INSERTION  [facility] Davina Poke, M.D.   Diet:   After surgery, the patient should take only liquids and foods as tolerated.  The patient may then have a regular diet after the effects of anesthesia have worn off, usually about four to six hours after surgery.  Activities:   The patient should rest until the effects of anesthesia have worn off.  After this, there are no restrictions on the normal daily activities.  Medications:   You will be given a prescription for antibiotic drops to be used in the ears postoperatively.  It is recommended to use 4 drops 2 times a day for 7 days, then the drops should be saved for possible future use.  The tubes should not cause any discomfort to the patient, but if there is any question, Tylenol should be given according to the instructions for the age of the patient.  Other medications should be continued normally.  Precautions:   Should there be recurrent drainage after the tubes are placed, the drops should be used for approximately 3-4 days.  If it does not clear, you should call the ENT office.  Earplugs:   Earplugs are only needed for those who are going to be submerged under water.  When taking a bath or shower and using a cup or showerhead to rinse hair, it is not necessary to wear earplugs.  These come in a variety of fashions, all of which can be obtained at our office.  However, if one is not able to come by the office, then silicone plugs can be found at most pharmacies.  It is not advised to stick anything in the ear that is not approved as an earplug.  Silly putty is not to be used as an earplug.  Swimming is allowed in patients after ear tubes are inserted, however, they must wear earplugs if they are going to be submerged under water.  For those children who are going to be swimming a lot, it is recommended to use a fitted ear mold, which can be  made by our audiologist.  If discharge is noticed from the ears, this most likely represents an ear infection.  We would recommend getting your eardrops and using them as indicated above.  If it does not clear, then you should call the ENT office.  For follow up, the patient should return to the ENT office three weeks postoperatively and then every six months as required by the doctor.

## 2023-10-22 ENCOUNTER — Other Ambulatory Visit: Payer: Self-pay

## 2023-10-22 ENCOUNTER — Encounter: Payer: Self-pay | Admitting: Unknown Physician Specialty

## 2023-10-22 ENCOUNTER — Encounter: Payer: Self-pay | Admitting: Anesthesiology

## 2023-10-22 ENCOUNTER — Ambulatory Visit
Admission: RE | Admit: 2023-10-22 | Discharge: 2023-10-22 | Disposition: A | Discharge status: Home / Self Care | Attending: Unknown Physician Specialty | Admitting: Unknown Physician Specialty

## 2023-10-22 ENCOUNTER — Ambulatory Visit: Payer: Self-pay | Admitting: Anesthesiology

## 2023-10-22 ENCOUNTER — Encounter
Admission: RE | Disposition: A | Discharge status: Home / Self Care | Payer: Self-pay | Source: Home / Self Care | Attending: Unknown Physician Specialty

## 2023-10-22 DIAGNOSIS — D479 Neoplasm of uncertain behavior of lymphoid, hematopoietic and related tissue, unspecified: Secondary | ICD-10-CM | POA: Diagnosis not present

## 2023-10-22 DIAGNOSIS — Z8572 Personal history of non-Hodgkin lymphomas: Secondary | ICD-10-CM | POA: Diagnosis not present

## 2023-10-22 DIAGNOSIS — H6521 Chronic serous otitis media, right ear: Secondary | ICD-10-CM | POA: Diagnosis not present

## 2023-10-22 DIAGNOSIS — H6523 Chronic serous otitis media, bilateral: Secondary | ICD-10-CM | POA: Diagnosis present

## 2023-10-22 DIAGNOSIS — D3705 Neoplasm of uncertain behavior of pharynx: Secondary | ICD-10-CM | POA: Diagnosis present

## 2023-10-22 HISTORY — PX: NASAL ENDOSCOPY: SHX6577

## 2023-10-22 HISTORY — DX: Obstructive sleep apnea (adult) (pediatric): G47.33

## 2023-10-22 HISTORY — PX: MYRINGOTOMY WITH TUBE PLACEMENT: SHX5663

## 2023-10-22 SURGERY — MYRINGOTOMY WITH TUBE PLACEMENT
Anesthesia: Monitor Anesthesia Care | Site: Ear | Laterality: Right

## 2023-10-22 MED ORDER — ONDANSETRON HCL 4 MG/2ML IJ SOLN
INTRAMUSCULAR | Status: DC | PRN
  Administered 2023-10-22: 4 mg via INTRAVENOUS

## 2023-10-22 MED ORDER — LACTATED RINGERS IV SOLN: INTRAVENOUS | Status: DC

## 2023-10-22 MED ORDER — DEXAMETHASONE SODIUM PHOSPHATE 4 MG/ML IJ SOLN
INTRAMUSCULAR | Status: AC
  Filled 2023-10-22: qty 2

## 2023-10-22 MED ORDER — CIPROFLOXACIN-DEXAMETHASONE 0.3-0.1 % OT SUSP
OTIC | Status: DC | PRN
  Administered 2023-10-22: 4 [drp] via OTIC

## 2023-10-22 MED ORDER — ONDANSETRON HCL 4 MG/2ML IJ SOLN
INTRAMUSCULAR | Status: AC
  Filled 2023-10-22: qty 2

## 2023-10-22 MED ORDER — ROCURONIUM BROMIDE 100 MG/10ML IV SOLN
INTRAVENOUS | Status: DC | PRN
  Administered 2023-10-22: 5 mg via INTRAVENOUS

## 2023-10-22 MED ORDER — FAMOTIDINE 200 MG/20ML IV SOLN
INTRAVENOUS | Status: AC
  Filled 2023-10-22: qty 1

## 2023-10-22 MED ORDER — SUCCINYLCHOLINE CHLORIDE 200 MG/10ML IV SOSY
PREFILLED_SYRINGE | INTRAVENOUS | Status: AC
  Filled 2023-10-22: qty 10

## 2023-10-22 MED ORDER — MIDAZOLAM HCL 2 MG/2ML IJ SOLN
INTRAMUSCULAR | Status: DC | PRN
  Administered 2023-10-22: 1.5 mg via INTRAVENOUS

## 2023-10-22 MED ORDER — PROPOFOL 10 MG/ML IV BOLUS
INTRAVENOUS | Status: DC | PRN
Start: 2023-10-22 — End: 2023-10-22
  Administered 2023-10-22: 200 mg via INTRAVENOUS

## 2023-10-22 MED ORDER — FENTANYL CITRATE (PF) 100 MCG/2ML IJ SOLN
INTRAMUSCULAR | Status: DC | PRN
  Administered 2023-10-22: 100 ug via INTRAVENOUS

## 2023-10-22 MED ORDER — ONDANSETRON HCL 4 MG/2ML IJ SOLN
INTRAMUSCULAR | Status: AC
Start: 2023-10-22 — End: 2023-10-22
  Filled 2023-10-22: qty 2

## 2023-10-22 MED ORDER — MIDAZOLAM HCL 2 MG/2ML IJ SOLN
INTRAMUSCULAR | Status: AC
  Filled 2023-10-22: qty 2

## 2023-10-22 MED ORDER — SODIUM CHLORIDE 0.9 % IV SOLN: INTRAVENOUS | Status: DC | PRN

## 2023-10-22 MED ORDER — FAMOTIDINE IN NACL 20-0.9 MG/50ML-% IV SOLN
20.0000 mg | Freq: Once | INTRAVENOUS | Status: AC
  Administered 2023-10-22: 20 mg via INTRAVENOUS

## 2023-10-22 MED ORDER — SUCCINYLCHOLINE CHLORIDE 200 MG/10ML IV SOSY
PREFILLED_SYRINGE | INTRAVENOUS | Status: DC | PRN
  Administered 2023-10-22: 120 mg via INTRAVENOUS

## 2023-10-22 MED ORDER — OXYMETAZOLINE HCL 0.05 % NA SOLN
NASAL | Status: AC
  Filled 2023-10-22: qty 30

## 2023-10-22 MED ORDER — ROCURONIUM BROMIDE 10 MG/ML (PF) SYRINGE
PREFILLED_SYRINGE | INTRAVENOUS | Status: AC
Start: 2023-10-22 — End: 2023-10-22
  Filled 2023-10-22: qty 10

## 2023-10-22 MED ORDER — OXYMETAZOLINE HCL 0.05 % NA SOLN
6.0000 | Freq: Once | NASAL | Status: AC
  Administered 2023-10-22: 6 via NASAL

## 2023-10-22 MED ORDER — LIDOCAINE HCL (CARDIAC) PF 100 MG/5ML IV SOSY
PREFILLED_SYRINGE | INTRAVENOUS | Status: DC | PRN
  Administered 2023-10-22: 80 mg via INTRAVENOUS

## 2023-10-22 MED ORDER — PHENYLEPHRINE HCL 0.5 % NA SOLN
NASAL | Status: DC | PRN
  Administered 2023-10-22: 15 [drp] via NASAL

## 2023-10-22 MED ORDER — PROPOFOL 10 MG/ML IV BOLUS
INTRAVENOUS | Status: AC
  Filled 2023-10-22: qty 20

## 2023-10-22 MED ORDER — LIDOCAINE HCL (PF) 1 % IJ SOLN
INTRAMUSCULAR | Status: DC | PRN
Start: 2023-10-22 — End: 2023-10-22
  Administered 2023-10-22: 15 mL

## 2023-10-22 MED ORDER — FENTANYL CITRATE (PF) 100 MCG/2ML IJ SOLN
INTRAMUSCULAR | Status: AC
  Filled 2023-10-22: qty 2

## 2023-10-22 MED ORDER — ROCURONIUM BROMIDE 10 MG/ML (PF) SYRINGE
PREFILLED_SYRINGE | INTRAVENOUS | Status: AC
  Filled 2023-10-22: qty 10

## 2023-10-22 SURGICAL SUPPLY — 27 items
BASIN GRAD PLASTIC 32OZ STRL (MISCELLANEOUS) ×2 IMPLANT
BLADE MYR LANCE NRW W/HDL (BLADE) ×2 IMPLANT
BLADE SURG 15 STRL LF DISP TIS (BLADE) ×2 IMPLANT
CANISTER SUCT 1200ML W/VALVE (MISCELLANEOUS) ×2 IMPLANT
CNTNR URN SCR LID CUP LEK RST (MISCELLANEOUS) IMPLANT
COAGULATOR SUCT 8FR VV (MISCELLANEOUS) IMPLANT
COTTONBALL LRG STERILE PKG (GAUZE/BANDAGES/DRESSINGS) ×2 IMPLANT
COVER MAYO STAND STRL (DRAPES) ×2 IMPLANT
CUP MEDICINE 2OZ PLAST GRAD ST (MISCELLANEOUS) ×4 IMPLANT
DRSG TELFA 4X3 1S NADH ST (GAUZE/BANDAGES/DRESSINGS) IMPLANT
ELECTRODE REM PT RTRN 9FT ADLT (ELECTROSURGICAL) IMPLANT
GAUZE SPONGE 4X4 12PLY STRL (GAUZE/BANDAGES/DRESSINGS) IMPLANT
GLOVE BIO SURGEON STRL SZ7.5 (GLOVE) ×4 IMPLANT
KIT TURNOVER KIT A (KITS) ×2 IMPLANT
MARKER SKIN DUAL TIP RULER LAB (MISCELLANEOUS) ×2 IMPLANT
NDL HYPO 25GX1X1/2 BEV (NEEDLE) ×2 IMPLANT
NEEDLE HYPO 25GX1X1/2 BEV (NEEDLE) ×2 IMPLANT
NS IRRIG 500ML POUR BTL (IV SOLUTION) ×2 IMPLANT
PACK ANTI FOG SOFT WIPE (PACKS) IMPLANT
PACK ENT CUSTOM (PACKS) IMPLANT
SPONGE NEURO XRAY DETECT 1X3 (DISPOSABLE) ×2 IMPLANT
STRAP BODY AND KNEE 60X3 (MISCELLANEOUS) ×2 IMPLANT
SUT PLAIN GUT 4-0 (SUTURE) ×2 IMPLANT
SYR 10ML LL (SYRINGE) ×2 IMPLANT
TOWEL OR 17X26 4PK STRL BLUE (TOWEL DISPOSABLE) ×2 IMPLANT
TUBE GRMT FLRPLST BEV 1.14 (OTOLOGIC RELATED) ×2 IMPLANT
TUBING SUCTION CONN 0.25 STRL (TUBING) ×2 IMPLANT

## 2023-10-22 NOTE — Transfer of Care (Signed)
 Immediate Anesthesia Transfer of Care Note  Patient: Sean Raymond  Procedure(s) Performed: MYRINGOTOMY WITH TUBE PLACEMENT (Right: Ear) ENDOSCOPY, NOSE (Ear)  Patient Location: PACU  Anesthesia Type:General  Level of Consciousness: awake, alert , and oriented  Airway & Oxygen Therapy: Patient Spontanous Breathing and Patient connected to face mask oxygen  Post-op Assessment: Report given to RN and Post -op Vital signs reviewed and stable  Post vital signs: stable  Last Vitals:  Vitals Value Taken Time  BP 134/62 10/22/23 11:51  Temp 36.8 C 10/22/23 11:52  Pulse 90 10/22/23 11:55  Resp 21 10/22/23 11:55  SpO2 96 % 10/22/23 11:55  Vitals shown include unfiled device data.  Last Pain:  Vitals:   10/22/23 1153  TempSrc:   PainSc: 0-No pain         Complications: No notable events documented.

## 2023-10-22 NOTE — Anesthesia Postprocedure Evaluation (Signed)
 Anesthesia Post Note  Patient: Sean Raymond  Procedure(s) Performed: MYRINGOTOMY WITH TUBE PLACEMENT (Right: Ear) ENDOSCOPY, NOSE (Ear)  Patient location during evaluation: PACU Anesthesia Type: MAC Level of consciousness: awake and alert Pain management: pain level controlled Vital Signs Assessment: post-procedure vital signs reviewed and stable Respiratory status: spontaneous breathing, nonlabored ventilation, respiratory function stable and patient connected to nasal cannula oxygen Cardiovascular status: blood pressure returned to baseline and stable Postop Assessment: no apparent nausea or vomiting Anesthetic complications: no   No notable events documented.   Last Vitals:  Vitals:   10/22/23 1200 10/22/23 1205  BP: 119/60   Pulse: 87 85  Resp: (!) 24 20  Temp:    SpO2: 92% 93%    Last Pain:  Vitals:   10/22/23 1200  TempSrc:   PainSc: 0-No pain                 Maryfer Tauzin C Marguita Venning

## 2023-10-22 NOTE — H&P (Signed)
 The patient's history has been reviewed, patient examined, no change in status, stable for surgery.  Questions were answered to the patients satisfaction.

## 2023-10-22 NOTE — Anesthesia Procedure Notes (Signed)
 Procedure Name: Intubation Date/Time: 10/22/2023 11:06 AM  Performed by: Elly Pfeiffer, CRNAPre-anesthesia Checklist: Patient identified, Emergency Drugs available, Suction available and Patient being monitored Patient Re-evaluated:Patient Re-evaluated prior to induction Oxygen Delivery Method: Circle system utilized Preoxygenation: Pre-oxygenation with 100% oxygen Induction Type: IV induction Ventilation: Mask ventilation without difficulty Laryngoscope Size: McGrath and 4 Grade View: Grade III Tube type: Oral Tube size: 7.0 mm Number of attempts: 2 Airway Equipment and Method: Stylet and Oral airway Placement Confirmation: ETT inserted through vocal cords under direct vision, positive ETCO2 and breath sounds checked- equal and bilateral Secured at: 22 cm Tube secured with: Tape Dental Injury: Teeth and Oropharynx as per pre-operative assessment  Comments: Poor view of glottic opening with DL MAC 3; marginally better view with McGrath 4; able to pass 7.0 ETT through small glottic opening. (+) BBS and ETCO2. Poor dentition noted as well. CA

## 2023-10-22 NOTE — Op Note (Signed)
 10/22/2023  11:31 AM    Lendia Harder  [id number]   Pre-Op Dx: CHRONIC OTITIS MEDIA NEOPLASM UNCERTAIN BEHAVIOR, NASOPHAYRNX  Post-op Dx: SAME  Proc: Right myringotomy and butterfly tube placement; endoscopic biopsy of nasopharyngeal mass  Surg:  Chinita ONEIDA Hasten  Anes:  GOT  EBL: Less than 10 cc  Comp: None  Findings: Serous otitis on the right, smooth nasopharyngeal mass  Procedure: Jadden was identified in the holding area take the operating placed in supine position.  After general Endo trach anesthesia the table was turned 90 degrees.  The operating microscope brought in the field examination the right ear showed some cerumen which was removed using curette.  There was obvious serous otitis.  An inferior myringotomy was performed the serous fluid was suctioned free.  A butterfly tube was placed in the myringotomy followed by Ciprodex  drops.  We this completed  topical anesthetic of phenylephrine lidocaine  solution was placed within each nostril.  After approximately 5 minutes these pledgets were removed.  The 0 degree endoscope was then introduced into the right nostril.  There was obvious nasopharyngeal mass which appeared to be lymphoid tissue with a smooth wall.  Multiple biopsies were taken of this mass using the straight ethmoid forceps.  The left nostril was also examined again the nasopharyngeal mass could be visualized several more biopsies were taken this was 1 contiguous mass.  With this completed the suction cautery was then used to cauterize the nasopharynx to prevent active bleeding.  With no bleeding the patient was returned to anesthesia where he was awakened in the operating room and taken to the cover room in stable condition.  Specimen: Nasopharyngeal mass  Dispo:   Good  Plan: Discharge to home follow-up 2 weeks  Chinita ONEIDA Hasten  10/22/2023 11:31 AM

## 2023-10-26 LAB — SURGICAL PATHOLOGY

## 2023-11-22 ENCOUNTER — Encounter: Payer: Self-pay | Admitting: Oncology

## 2023-11-22 ENCOUNTER — Other Ambulatory Visit: Payer: Self-pay | Admitting: *Deleted

## 2023-11-22 DIAGNOSIS — C8218 Follicular lymphoma grade II, lymph nodes of multiple sites: Secondary | ICD-10-CM

## 2023-11-23 ENCOUNTER — Inpatient Hospital Stay: Attending: Oncology

## 2023-11-23 ENCOUNTER — Inpatient Hospital Stay

## 2023-11-23 ENCOUNTER — Inpatient Hospital Stay (HOSPITAL_BASED_OUTPATIENT_CLINIC_OR_DEPARTMENT_OTHER): Admitting: Oncology

## 2023-11-23 ENCOUNTER — Other Ambulatory Visit: Payer: Self-pay | Admitting: *Deleted

## 2023-11-23 ENCOUNTER — Encounter: Payer: Self-pay | Admitting: Oncology

## 2023-11-23 VITALS — BP 123/62 | HR 85 | Temp 98.0°F | Resp 18 | Ht 69.0 in | Wt 202.0 lb

## 2023-11-23 VITALS — BP 121/64 | HR 78 | Temp 98.2°F | Resp 17

## 2023-11-23 DIAGNOSIS — C8218 Follicular lymphoma grade II, lymph nodes of multiple sites: Secondary | ICD-10-CM

## 2023-11-23 DIAGNOSIS — Z452 Encounter for adjustment and management of vascular access device: Secondary | ICD-10-CM | POA: Diagnosis not present

## 2023-11-23 DIAGNOSIS — C821A Follicular lymphoma grade II, in remission: Secondary | ICD-10-CM | POA: Diagnosis present

## 2023-11-23 DIAGNOSIS — Z79620 Long term (current) use of immunosuppressive biologic: Secondary | ICD-10-CM | POA: Diagnosis not present

## 2023-11-23 DIAGNOSIS — Z95828 Presence of other vascular implants and grafts: Secondary | ICD-10-CM

## 2023-11-23 DIAGNOSIS — Z5112 Encounter for antineoplastic immunotherapy: Secondary | ICD-10-CM | POA: Diagnosis present

## 2023-11-23 LAB — CMP (CANCER CENTER ONLY)
ALT: 12 U/L (ref 0–44)
AST: 40 U/L (ref 15–41)
Albumin: 3.8 g/dL (ref 3.5–5.0)
Alkaline Phosphatase: 73 U/L (ref 38–126)
Anion gap: 10 (ref 5–15)
BUN: 13 mg/dL (ref 8–23)
CO2: 25 mmol/L (ref 22–32)
Calcium: 9 mg/dL (ref 8.9–10.3)
Chloride: 101 mmol/L (ref 98–111)
Creatinine: 0.91 mg/dL (ref 0.61–1.24)
GFR, Estimated: >60 mL/min (ref >60)
Glucose, Bld: 114 mg/dL — ABNORMAL HIGH (ref 70–99)
Potassium: 3.5 mmol/L (ref 3.5–5.1)
Sodium: 136 mmol/L (ref 135–145)
Total Bilirubin: 1 mg/dL (ref 0.0–1.2)
Total Protein: 6.1 g/dL — ABNORMAL LOW (ref 6.5–8.1)

## 2023-11-23 LAB — CBC WITH DIFFERENTIAL (CANCER CENTER ONLY)
Abs Immature Granulocytes: 0.02 K/uL (ref 0.00–0.07)
Basophils Absolute: 0.1 K/uL (ref 0.0–0.1)
Basophils Relative: 1 %
Eosinophils Absolute: 0.1 K/uL (ref 0.0–0.5)
Eosinophils Relative: 1 %
HCT: 35.5 % — ABNORMAL LOW (ref 39.0–52.0)
Hemoglobin: 11.8 g/dL — ABNORMAL LOW (ref 13.0–17.0)
Immature Granulocytes: 0 %
Lymphocytes Relative: 17 %
Lymphs Abs: 0.9 K/uL (ref 0.7–4.0)
MCH: 30.1 pg (ref 26.0–34.0)
MCHC: 33.2 g/dL (ref 30.0–36.0)
MCV: 90.6 fL (ref 80.0–100.0)
Monocytes Absolute: 0.8 K/uL (ref 0.1–1.0)
Monocytes Relative: 15 %
Neutro Abs: 3.6 K/uL (ref 1.7–7.7)
Neutrophils Relative %: 66 %
Platelet Count: 139 K/uL — ABNORMAL LOW (ref 150–400)
RBC: 3.92 MIL/uL — ABNORMAL LOW (ref 4.22–5.81)
RDW: 12.7 % (ref 11.5–15.5)
WBC Count: 5.4 K/uL (ref 4.0–10.5)
nRBC: 0 % (ref 0.0–0.2)

## 2023-11-23 MED ORDER — ACETAMINOPHEN 325 MG PO TABS
650.0000 mg | ORAL_TABLET | Freq: Once | ORAL | Status: AC
  Administered 2023-11-23: 650 mg via ORAL
  Filled 2023-11-23: qty 2

## 2023-11-23 MED ORDER — DEXAMETHASONE SODIUM PHOSPHATE 10 MG/ML IJ SOLN
10.0000 mg | Freq: Once | INTRAMUSCULAR | Status: AC
  Administered 2023-11-23: 10 mg via INTRAVENOUS
  Filled 2023-11-23: qty 1

## 2023-11-23 MED ORDER — DIPHENHYDRAMINE HCL 50 MG/ML IJ SOLN
25.0000 mg | Freq: Once | INTRAMUSCULAR | Status: AC
  Administered 2023-11-23: 25 mg via INTRAVENOUS
  Filled 2023-11-23: qty 1

## 2023-11-23 MED ORDER — ALTEPLASE 2 MG IJ SOLR
2.0000 mg | Freq: Once | INTRAMUSCULAR | Status: AC | PRN
  Administered 2023-11-23: 2 mg
  Filled 2023-11-23: qty 2

## 2023-11-23 MED ORDER — FAMOTIDINE IN NACL 20-0.9 MG/50ML-% IV SOLN
20.0000 mg | Freq: Once | INTRAVENOUS | Status: AC
  Administered 2023-11-23: 20 mg via INTRAVENOUS
  Filled 2023-11-23: qty 50

## 2023-11-23 MED ORDER — ALTEPLASE 2 MG IJ SOLR
2.0000 mg | Freq: Once | INTRAMUSCULAR | Status: AC
  Administered 2023-11-23: 2 mg
  Filled 2023-11-23: qty 2

## 2023-11-23 MED ORDER — SODIUM CHLORIDE 0.9 % IV SOLN
375.0000 mg/m2 | Freq: Once | INTRAVENOUS | Status: AC
  Administered 2023-11-23: 800 mg via INTRAVENOUS
  Filled 2023-11-23: qty 50

## 2023-11-23 MED ORDER — SODIUM CHLORIDE 0.9 % IV SOLN
INTRAVENOUS | Status: DC
  Filled 2023-11-23: qty 250

## 2023-11-23 MED ORDER — SODIUM CHLORIDE 0.9% FLUSH
10.0000 mL | INTRAVENOUS | Status: DC | PRN
  Administered 2023-11-23: 10 mL
  Filled 2023-11-23: qty 10

## 2023-11-23 NOTE — Progress Notes (Signed)
 Second dose of cath flo administered no blood flow at 30 min and 120 min per MD will proceed will dye study. Continuing to use Peripheral IV for reminder of infusion. Patient verbalizes understanding, no concerns voiced. Scheduler informed

## 2023-11-23 NOTE — Progress Notes (Signed)
 Patient 2hr post Cathflow still no blood return from port. MD informed per Dr. Jacobo ok to use 2nd dose Cathflo and/or proceedwith using port for Rixtumab despite no blood return. Peripheral IV already started with instill 2nd dose cathflo and recheck.

## 2023-11-23 NOTE — Progress Notes (Signed)
 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  He recently had tubes placed this year with improvement of his hearing and decrease of the fluid buildup.  The lymph node in his left axilla continues to enlarge. He does not complain of any weakness or fatigue.  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 He has no other neurologic complaints.  He denies any fevers, weight loss, or night sweats. He denies any chest pain, shortness of breath, cough, or hemoptysis.  He denies any nausea, vomiting, constipation, or diarrhea.  He has no urinary complaints.  Patient offers no further specific complaints today.  REVIEW OF SYSTEMS:   Review of Systems  Constitutional: Negative.  Negative for diaphoresis, fever, malaise/fatigue and weight loss.  Eyes:  Positive for double vision. Negative for blurred vision and pain.  Respiratory: Negative.  Negative for cough and shortness of breath.   Cardiovascular: Negative.  Negative for chest pain and leg swelling.  Gastrointestinal: Negative.  Negative for abdominal pain.  Genitourinary: Negative.  Negative for dysuria.  Musculoskeletal: Negative.  Negative for neck pain.  Skin: Negative.  Negative for rash.  Neurological: Negative.  Negative for sensory change, focal weakness, weakness and headaches.  Psychiatric/Behavioral: Negative.  The patient is not nervous/anxious.     As per HPI. Otherwise, a complete review of systems is negative.  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  FAMILY HISTORY: Colon cancer and lung cancer.     ADVANCED DIRECTIVES:    HEALTH MAINTENANCE: Social History   Tobacco Use   Smoking status: Never   Smokeless tobacco: Never  Vaping Use   Vaping status: Never Used  Substance Use Topics   Alcohol use: Yes    Alcohol/week: 2.0 standard drinks of alcohol    Types: 2 Cans of beer per week    Comment: occasional   Drug use: No     Allergies  Allergen Reactions   Rituximab -Pvvr Other (See Comments)    Patient had a hypersensitivity to Rituximab . See progress note on 01/13/23. Patient able to complete infusion.   Tape Rash    bandaids    Current Outpatient Medications  Medication Sig Dispense Refill   alfuzosin (UROXATRAL) 10 MG 24 hr tablet Take 10 mg by mouth daily.     amLODipine (NORVASC) 5 MG tablet Take 5 mg by mouth daily.      atorvastatin (LIPITOR) 10 MG  tablet Take 10 mg by mouth daily at 6 PM.      Calcium Carbonate-Vitamin D 600-400 MG-UNIT per tablet Take 1 tablet by mouth daily.      cetirizine (ZYRTEC) 10 MG tablet Take 10 mg by mouth daily.     ciprofloxacin -dexamethasone  (CIPRODEX ) OTIC suspension Place 4 drops into both ears 2 (two) times daily for 5 days. DOS 10/22/23. 7.5 mL 0   Multiple Vitamin (MULTI-VITAMINS) TABS Take 1 tablet by mouth daily.      omeprazole (PRILOSEC) 40 MG capsule Take 40 mg by mouth daily.      tadalafil (CIALIS) 20 MG tablet Take 10 mg by mouth daily  as needed.     aspirin EC 81 MG tablet Take 81 mg by mouth daily.  (Patient not taking: Reported on 11/23/2023)     No current facility-administered medications for this visit.   Facility-Administered Medications Ordered in Other Visits  Medication Dose Route Frequency Provider Last Rate Last Admin   0.9 %  sodium chloride  infusion   Intravenous Continuous Sander Remedios J, MD 10 mL/hr at 11/23/23 1054 New Bag at 11/23/23 1054   sodium chloride  flush (NS) 0.9 % injection 10 mL  10 mL Intracatheter PRN Jacobo Evalene PARAS, MD        OBJECTIVE: Vitals:   11/23/23 0913  BP: 123/62  Pulse: 85  Resp: 18  Temp: 98 F (36.7 C)  SpO2: 96%       Body mass index is 29.83 kg/m.    ECOG FS:0 - Asymptomatic  General: Well-developed, well-nourished, no acute distress. Eyes: Pink conjunctiva, anicteric sclera. HEENT: Normocephalic, moist mucous membranes. Lungs: No audible wheezing or coughing. Heart: Regular rate and rhythm. Abdomen: Soft, nontender, no obvious distention. Musculoskeletal: No edema, cyanosis, or clubbing. Neuro: Alert, answering all questions appropriately. Cranial nerves grossly intact. Skin: No rashes or petechiae noted. Psych: Normal affect. Lymphatics: Easily palpable 4 to 5 cm lymph node in left axilla.  LAB RESULTS:  Lab Results  Component Value Date   NA 136 11/23/2023   K 3.5 11/23/2023   CL 101 11/23/2023   CO2 25 11/23/2023   GLUCOSE 114 (H) 11/23/2023   BUN 13 11/23/2023   CREATININE 0.91 11/23/2023   CALCIUM 9.0 11/23/2023   PROT 6.1 (L) 11/23/2023   ALBUMIN 3.8 11/23/2023   AST 40 11/23/2023   ALT 12 11/23/2023   ALKPHOS 73 11/23/2023   BILITOT 1.0 11/23/2023   GFRNONAA >60 11/23/2023   GFRAA >60 12/21/2019    Lab Results  Component Value Date   WBC 5.4 11/23/2023   NEUTROABS 3.6 11/23/2023   HGB 11.8 (L) 11/23/2023   HCT 35.5 (L) 11/23/2023   MCV 90.6 11/23/2023   PLT 139 (L) 11/23/2023     STUDIES: No results  found.    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  Imaging with CT scan on December 28, 2022 reviewed independently with continued progression of disease.  Patient also had a mild pancytopenia and increased fatigue.  He agreed to weekly Rituxan  x 4 for control of disease.  Patient completed week 4 of treatment on February 03, 2023.  Repeat imaging on March 18, 2023 reviewed independently with a mixed response to therapy.  Because of this, patient agreed to maintenance Rituxan  every 8 weeks for a total of 2 years.  Repeat CT  scan on Aug 18, 2023 reviewed independently with again a mixed response to therapy.  Given the enlarging lymph node in his left axilla, will reimage with PET scan in the next 1 to 2 weeks.  Proceed with cycle 5 of maintenance Rituxan  today.  Patient will return to clinic after his PET scan to discuss the results.  Patient also had consultation with radiation oncology that day.  Will keep cycle 6 of treatment in 8 weeks as scheduled. Axillary lymphadenopathy: PET scan and follow-up with radiation oncology as above. Anemia: Hemoglobin has trended down slightly to 11.8.  He has a mildly decreased iron saturation ratio, otherwise his iron panel is within normal limits. Thrombocytopenia: Chronic and unchanged.   History of reaction to Rituxan : Likely rate based.  Additional premeds have been added for the remainder of his treatments. Hearing loss/fluid: Resolved with tube placement by ENT.   Patient expressed understanding and was in agreement with this plan. He also understands that He can call clinic at any time with any questions, concerns, or complaints.    Evalene JINNY Reusing, MD   11/23/2023 1:04 PM

## 2023-11-23 NOTE — Progress Notes (Signed)
 Patient's ENT provider, Dr. Herminio, would like for Dr. Jacobo to send over an update on the plan, they Lymphoma in his ear, they put a tube in his ear the same day as the biopsy.

## 2023-11-23 NOTE — Patient Instructions (Signed)
 [facility] - A DEPT OF [facility]  Discharge Instructions: Thank you for choosing [facility] to provide your oncology and hematology care.  If you have a lab appointment with the Cancer Center, please go directly to the Cancer Center and check in at the registration area.  Wear comfortable clothing and clothing appropriate for easy access to any Portacath or PICC line.   We strive to give you quality time with your provider. You may need to reschedule your appointment if you arrive late (15 or more minutes).  Arriving late affects you and other patients whose appointments are after yours.  Also, if you miss three or more appointments without notifying the office, you may be dismissed from the clinic at the provider's discretion.      For prescription refill requests, have your pharmacy contact our office and allow 72 hours for refills to be completed.    Today you received the following chemotherapy and/or immunotherapy agents: riTUXimab     To help prevent nausea and vomiting after your treatment, we encourage you to take your nausea medication as directed.  BELOW ARE SYMPTOMS THAT SHOULD BE REPORTED IMMEDIATELY: *FEVER GREATER THAN 100.4 F (38 C) OR HIGHER *CHILLS OR SWEATING *NAUSEA AND VOMITING THAT IS NOT CONTROLLED WITH YOUR NAUSEA MEDICATION *UNUSUAL SHORTNESS OF BREATH *UNUSUAL BRUISING OR BLEEDING *URINARY PROBLEMS (pain or burning when urinating, or frequent urination) *BOWEL PROBLEMS (unusual diarrhea, constipation, pain near the anus) TENDERNESS IN MOUTH AND THROAT WITH OR WITHOUT PRESENCE OF ULCERS (sore throat, sores in mouth, or a toothache) UNUSUAL RASH, SWELLING OR PAIN  UNUSUAL VAGINAL DISCHARGE OR ITCHING   Items with * indicate a potential emergency and should be followed up as soon as possible or go to the Emergency Department if any problems should occur.  Please show the CHEMOTHERAPY ALERT CARD or IMMUNOTHERAPY  ALERT CARD at check-in to the Emergency Department and triage nurse.  Should you have questions after your visit or need to cancel or reschedule your appointment, please contact [facility] - A DEPT OF [facility]  [phone] and follow the prompts.  Office hours are 8:00 a.m. to 4:30 p.m. Monday - Friday. Please note that voicemails left after 4:00 p.m. may not be returned until the following business day.  We are closed weekends and major holidays. You have access to a nurse at all times for urgent questions. Please call the main number to the clinic [phone] and follow the prompts.  For any non-urgent questions, you may also contact your provider using MyChart. We now offer e-Visits for anyone 15 and older to request care online for non-urgent symptoms. For details visit [email or web address].   Also download the MyChart app! Go to the app store, search MyChart, open the app, select [facility], and log in with your MyChart username and password.

## 2023-11-24 ENCOUNTER — Ambulatory Visit
Admission: RE | Admit: 2023-11-24 | Discharge: 2023-11-24 | Disposition: A | Discharge status: Home / Self Care | Source: Ambulatory Visit | Attending: Oncology | Admitting: Oncology

## 2023-11-24 DIAGNOSIS — C8218 Follicular lymphoma grade II, lymph nodes of multiple sites: Secondary | ICD-10-CM | POA: Insufficient documentation

## 2023-11-24 DIAGNOSIS — Z95828 Presence of other vascular implants and grafts: Secondary | ICD-10-CM

## 2023-11-24 DIAGNOSIS — Z452 Encounter for adjustment and management of vascular access device: Secondary | ICD-10-CM | POA: Insufficient documentation

## 2023-11-24 HISTORY — PX: IR CV LINE INJECTION: IMG2294

## 2023-11-24 MED ORDER — STERILE WATER FOR INJECTION IJ SOLN
INTRAMUSCULAR | Status: AC
  Filled 2023-11-24: qty 10

## 2023-11-24 MED ORDER — HEPARIN SOD (PORK) LOCK FLUSH 100 UNIT/ML IV SOLN
INTRAVENOUS | Status: AC
  Filled 2023-11-24: qty 5

## 2023-11-24 MED ORDER — IOHEXOL 300 MG/ML  SOLN
40.0000 mL | Freq: Once | INTRAMUSCULAR | Status: AC | PRN
  Administered 2023-11-24: 40 mL via INTRAVENOUS

## 2023-11-24 MED ORDER — ALTEPLASE 2 MG IJ SOLR
INTRAMUSCULAR | Status: AC
  Filled 2023-11-24: qty 2

## 2023-11-24 MED ORDER — HEPARIN SOD (PORK) LOCK FLUSH 100 UNIT/ML IV SOLN
500.0000 [IU] | Freq: Once | INTRAVENOUS | Status: AC
  Administered 2023-11-24: 500 [IU] via INTRAVENOUS

## 2023-12-07 ENCOUNTER — Ambulatory Visit

## 2023-12-14 ENCOUNTER — Ambulatory Visit: Admitting: Oncology

## 2023-12-14 ENCOUNTER — Institutional Professional Consult (permissible substitution): Admitting: Radiation Oncology

## 2023-12-14 ENCOUNTER — Ambulatory Visit
Admission: RE | Admit: 2023-12-14 | Discharge: 2023-12-14 | Disposition: A | Discharge status: Home / Self Care | Source: Ambulatory Visit | Attending: Oncology | Admitting: Oncology

## 2023-12-14 DIAGNOSIS — C8218 Follicular lymphoma grade II, lymph nodes of multiple sites: Secondary | ICD-10-CM | POA: Insufficient documentation

## 2023-12-14 DIAGNOSIS — N400 Enlarged prostate without lower urinary tract symptoms: Secondary | ICD-10-CM | POA: Insufficient documentation

## 2023-12-14 DIAGNOSIS — R609 Edema, unspecified: Secondary | ICD-10-CM | POA: Diagnosis not present

## 2023-12-14 LAB — GLUCOSE, CAPILLARY: Glucose-Capillary: 96 mg/dL (ref 70–99)

## 2023-12-14 MED ORDER — FLUDEOXYGLUCOSE F - 18 (FDG) INJECTION
11.0600 | Freq: Once | INTRAVENOUS | Status: AC | PRN
Start: 2023-12-14 — End: 2023-12-14
  Administered 2023-12-14: 11.06 via INTRAVENOUS

## 2023-12-21 ENCOUNTER — Inpatient Hospital Stay: Attending: Oncology | Admitting: Oncology

## 2023-12-21 ENCOUNTER — Ambulatory Visit
Admission: RE | Admit: 2023-12-21 | Discharge: 2023-12-21 | Disposition: A | Discharge status: Home / Self Care | Source: Ambulatory Visit | Attending: Radiation Oncology | Admitting: Radiation Oncology

## 2023-12-21 ENCOUNTER — Encounter: Payer: Self-pay | Admitting: Oncology

## 2023-12-21 VITALS — BP 125/72 | HR 102 | Temp 98.6°F | Wt 192.0 lb

## 2023-12-21 DIAGNOSIS — C8210 Follicular lymphoma grade II, unspecified site: Secondary | ICD-10-CM | POA: Insufficient documentation

## 2023-12-21 DIAGNOSIS — Z79630 Long term (current) use of alkylating agent: Secondary | ICD-10-CM | POA: Diagnosis not present

## 2023-12-21 DIAGNOSIS — D696 Thrombocytopenia, unspecified: Secondary | ICD-10-CM | POA: Diagnosis not present

## 2023-12-21 DIAGNOSIS — R591 Generalized enlarged lymph nodes: Secondary | ICD-10-CM | POA: Insufficient documentation

## 2023-12-21 DIAGNOSIS — C8218 Follicular lymphoma grade II, lymph nodes of multiple sites: Secondary | ICD-10-CM | POA: Diagnosis not present

## 2023-12-21 DIAGNOSIS — D649 Anemia, unspecified: Secondary | ICD-10-CM | POA: Insufficient documentation

## 2023-12-21 DIAGNOSIS — Z79620 Long term (current) use of immunosuppressive biologic: Secondary | ICD-10-CM | POA: Insufficient documentation

## 2023-12-21 DIAGNOSIS — C8250 Diffuse follicle center lymphoma, unspecified site: Secondary | ICD-10-CM

## 2023-12-21 DIAGNOSIS — Z5112 Encounter for antineoplastic immunotherapy: Secondary | ICD-10-CM | POA: Diagnosis present

## 2023-12-21 MED ORDER — ACYCLOVIR 400 MG PO TABS
400.0000 mg | ORAL_TABLET | Freq: Every day | ORAL | 3 refills | Status: AC
Start: 2023-12-21 — End: ?

## 2023-12-21 MED ORDER — ALLOPURINOL 300 MG PO TABS: 300.0000 mg | ORAL_TABLET | Freq: Every day | ORAL | 3 refills | Status: DC

## 2023-12-21 MED ORDER — ONDANSETRON HCL 8 MG PO TABS: 8.0000 mg | ORAL_TABLET | Freq: Three times a day (TID) | ORAL | 2 refills | Status: AC | PRN

## 2023-12-21 MED ORDER — PROCHLORPERAZINE MALEATE 10 MG PO TABS: 10.0000 mg | ORAL_TABLET | Freq: Four times a day (QID) | ORAL | 2 refills | Status: AC | PRN

## 2023-12-21 NOTE — Progress Notes (Signed)
 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  INTERVAL HISTORY: Patient returns with clinic today for further evaluation, discussion of his PET scan results, and treatment planning.  He currently feels well and is asymptomatic.  He does not complain of any weakness or fatigue.  He denies any fevers or night sweats.  The lymph node in his left axilla continues to enlarge. 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 He has no other neurologic complaints. He denies any chest pain, shortness of breath, cough, or hemoptysis.  He denies any nausea, vomiting, constipation, or diarrhea.  He has no urinary complaints.  Patient offers no further specific complaints today.  REVIEW OF SYSTEMS:   Review of Systems  Constitutional: Negative.  Negative for diaphoresis, fever, malaise/fatigue and weight loss.  Eyes:  Positive for double vision. Negative for blurred vision and pain.  Respiratory: Negative.  Negative for cough and shortness of breath.   Cardiovascular: Negative.  Negative for chest pain and leg swelling.  Gastrointestinal: Negative.  Negative for abdominal pain.  Genitourinary: Negative.  Negative for dysuria.  Musculoskeletal: Negative.  Negative for neck pain.  Skin: Negative.  Negative for rash.  Neurological: Negative.  Negative for sensory change, focal weakness, weakness and headaches.  Psychiatric/Behavioral: Negative.  The patient is not nervous/anxious.     As per HPI. Otherwise, a complete review of systems is negative.  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  FAMILY HISTORY: Colon cancer and lung cancer.     ADVANCED DIRECTIVES:    HEALTH MAINTENANCE: Social History   Tobacco Use   Smoking status: Never   Smokeless tobacco: Never  Vaping Use   Vaping status: Never Used  Substance Use Topics   Alcohol use: Yes    Alcohol/week: 2.0 standard drinks of alcohol    Types: 2 Cans of beer per week    Comment: occasional   Drug use: No     Allergies  Allergen Reactions   Rituximab -Pvvr Other (See Comments)    Patient had a hypersensitivity to Rituximab . See progress note on 01/13/23. Patient able to complete infusion.   Tape Rash    bandaids    Current Outpatient Medications  Medication Sig Dispense Refill   alfuzosin (UROXATRAL) 10 MG 24 hr tablet Take 10 mg by mouth daily.     amLODipine (NORVASC) 5 MG tablet Take 5 mg by mouth daily.      atorvastatin (LIPITOR) 10 MG tablet Take 10 mg by mouth daily at 6 PM.  Calcium Carbonate-Vitamin D 600-400 MG-UNIT per tablet Take 1 tablet by mouth daily.      cetirizine (ZYRTEC) 10 MG tablet Take 10 mg by mouth daily.     ciprofloxacin -dexamethasone  (CIPRODEX ) OTIC suspension Place 4 drops into both ears 2 (two) times daily for 5 days. DOS 10/22/23. 7.5 mL 0   Multiple Vitamin (MULTI-VITAMINS) TABS Take 1 tablet by mouth daily.      omeprazole (PRILOSEC) 40 MG capsule Take 40 mg by mouth daily.      tadalafil (CIALIS) 20 MG tablet Take 10 mg by mouth daily as needed.     aspirin EC 81 MG tablet Take 81 mg by mouth daily.   (Patient not taking: Reported on 12/21/2023)     No current facility-administered medications for this visit.    OBJECTIVE: Vitals:   12/21/23 1300  BP: 125/72  Pulse: (!) 102  Temp: 98.6 F (37 C)  SpO2: 95%       Body mass index is 28.35 kg/m.    ECOG FS:0 - Asymptomatic  General: Well-developed, well-nourished, no acute distress. Eyes: Pink conjunctiva, anicteric sclera. HEENT: Normocephalic, moist mucous membranes. Lungs: No audible wheezing or coughing. Heart: Regular rate and rhythm. Abdomen: Soft, nontender, no obvious distention. Musculoskeletal: No edema, cyanosis, or clubbing. Neuro: Alert, answering all questions appropriately. Cranial nerves grossly intact. Skin: No rashes or petechiae noted. Psych: Normal affect. Lymphatics: Easily palpable 4 to 5 cm lymph node in left axilla.  LAB RESULTS:  Lab Results  Component Value Date   NA 136 11/23/2023   K 3.5 11/23/2023   CL 101 11/23/2023   CO2 25 11/23/2023   GLUCOSE 114 (H) 11/23/2023   BUN 13 11/23/2023   CREATININE 0.91 11/23/2023   CALCIUM 9.0 11/23/2023   PROT 6.1 (L) 11/23/2023   ALBUMIN 3.8 11/23/2023   AST 40 11/23/2023   ALT 12 11/23/2023   ALKPHOS 73 11/23/2023   BILITOT 1.0 11/23/2023   GFRNONAA >60 11/23/2023   GFRAA >60 12/21/2019    Lab Results  Component Value Date   WBC 5.4 11/23/2023   NEUTROABS 3.6 11/23/2023   HGB 11.8 (L) 11/23/2023   HCT 35.5 (L) 11/23/2023   MCV 90.6 11/23/2023   PLT 139 (L) 11/23/2023     STUDIES: NM PET Image Restag (PS) Skull Base To Thigh Result Date: 12/19/2023 EXAM: PET AND CT SKULL BASE TO MID THIGH 12/14/2023 09:14:45 AM TECHNIQUE: PET imaging was acquired from the base of the skull to the mid thighs. Non-contrast enhanced computed tomography was obtained for attenuation correction and anatomic localization. RADIOPHARMACEUTICAL: 11.06 mCi F-18 FDG Uptake time 60 minutes. Glucose level 96 mg/dl. COMPARISON: CT 08/18/2023 CLINICAL HISTORY: Worsening  lymphadenopathy on treatment, lymphoma. FINDINGS: HEAD AND NECK: Nasopharyngeal mass centered around the right tonsillar region is again noted measuring 3.6 x 2.5 cm with SUV max of 20.6, axial image 12. Previously this measured 2.2 x 2.9 cm. Bilateral tracer avid cervical lymph nodes. The dominant lymph node is in the right level 2 region measures 1.8 cm with SUV max of 17.3, axial image 22. On the previous examination this measured 1.2 cm. CHEST: Bilateral tracer avid axillary lymph nodes are identified. Index lymph node within the left axilla measures 5.6 cm with SUV max of 29.9, axial image 54. Previously this measured 3.4 cm. Tracer avid high right paratracheal lymph node measures 1.5 cm with SUV max of 12.4, axial image 36. Previously 0.9 cm. No tracer avid pulmonary nodule or mass. ABDOMEN AND PELVIS: No abnormal  tracer uptake identified within the liver. Extensive tracer uptake adenopathy within the abdomen, pelvis, and inguinal regions : -There is diffuse soft tissue stranding about the pancreas. The pancreatic parenchyma appears mildly edematous. Nodal mass within the upper abdomen which appears to involve the body and tail of pancreas measures 8.1 x 5.8 cm and has an SUV max of 20.0, axial image 81. On the previous exam there were 3 distinct lymph nodes in this area which measured up to 1.3 cm. -Index left retroperitoneal node measures 3.6 cm with SUV max of 17.0. This is new compared with the previous exam. -Porta caval lymph node measures 2.1 cm with SUV max of 12.2, axial image 85. On the previous exam this measured 1.4 cm. - 1.2 cm left mesenteric lymph node with SUV max of 6.7, axial image 112. New from the previous exam. -Large tracer avid left external iliac node measures 7.6 cm with SUV max of 20.7, axial image 131. On the previous exam this measured 2.4 cm. -Index node measures 1.6 cm on the left with SUV max of 9.9, axial image 158. Previously this measured 0.7 cm. The spleen measures 16.2 cm in  length containing multiple tracer avid lesions. Index lesion within the posterior spleen measures 2.0 cm with SUV max of 19.9, axial image 82. On the prior exam the spleen measured 13.6 cm. The index lesion within the posterior spleen measured 1.3 cm. Bilateral very low attenuation adrenal nodules are compatible with benign adenomas. The largest is on the left measuring 2.8 cm. These are similar to the previous exam. Circumferential wall thickening of the urinary bladder with surrounding haziness. There is a diverticulum of the right side of bladder dome measuring 2.2 cm. Prostate gland is enlarged with mass effect upon the base of bladder. BONES AND SOFT TISSUE: No abnormal FDG activity localizes to the bones. Scattered areas of hazy soft tissue infiltration with mild increased radiotracer uptake is identified throughout the chest and abdominal wall with scattered nodular areas of increased uptake. For example, along the left posterior chest wall there is a focal area of increased uptake with SUV max of 3.3, axial image 39. with SUV max of 10.7, axial image 13. No metabolically active aggressive osseous lesion. IMPRESSION: 1. Worsening lymphadenopathy and splenic involvement, consistent with progression of grade 2 follicular lymphoma. 2. Mildly edematous pancreatic parenchyma with diffuse soft tissue stranding and nodal mass involving the body and tail of pancreas, measuring 8.1 x 5.8 cm with SUV max 20.0. Findings are concerning for pancreas involvement by lymphoma. Superimposed acute pancreatitis cannot be excluded. 3. Increased radiotracer uptake throughout the chest and abdominal wall subcutaneous soft tissues with nodular areas of mild increased uptake. Cannot exclude cutaneous involvement by lymphoma. 4. Enlarged prostate gland with mass effect upon the base of bladder. Electronically signed by: Waddell Calk MD 12/19/2023 07:30 AM EDT RP Workstation: [id number]   IR CV Line Injection Result Date:  11/24/2023 INDICATION: 78 year old male with a surgically placed right-sided port catheter which was placed in 2010. He is having difficulty with aspiration. EXAM: IR NON-TUNNELED CENTRAL VENOUS CATH PLC W IMG MEDICATIONS: None. ANESTHESIA/SEDATION: None. FLUOROSCOPY TIME:  Radiation exposure index: 54.2 mGy, air kerma COMPLICATIONS: None immediate. PROCEDURE: Informed written consent was obtained from the patient after a thorough discussion of the procedural risks, benefits and alternatives. All questions were addressed. Maximal Sterile Barrier Technique was utilized including caps, mask, sterile gowns, sterile gloves, sterile drape, hand hygiene and skin antiseptic. A timeout was performed prior to the initiation  of the procedure. The port catheter was visualized fluoroscopically. This is a Bard power injectable single-lumen port catheter via a right subclavian approach. The tip of the catheter is well positioned at the cavoatrial junction. No evidence of obvious discontinuity. Next, contrast was injected using digital subtraction angiography. There is a filling defect within the port reservoir consistent with a small rounded thrombus. No evidence of pinch off syndrome or discontinuity. Contrast does exit the tip of the catheter an a somewhat irregular fashion suggesting the possibility of a small fibrin sheath. IMPRESSION: 1. Well-positioned right subclavian approach single-lumen power injectable port catheter. No evidence of catheter discontinuity or fracture. 2. Suspect a combination of a small thrombus within the reservoir lumen and a fibrin sheath at the catheter tip. 3. Consider initial attempt at catheter stripping to remove the fibrin sheath and restore aspiration. If unsuccessful, port catheter revision with removal of the existing device and placement of a new device could then be performed. Electronically Signed   By: Wilkie Lent M.D.   On: 11/24/2023 14:54      ASSESSMENT: FProgressive grade  2 follicular lymphoma.  PLAN:    Progressive grade 2 follicular lymphoma: Patient initially received treatment over 15 years ago with R-CHOP in [place] .  He then underwent rituximab  and Zevalin therapy in June 2014.  He did not require any treatment until imaging with CT scan on December 28, 2022 reported continued progression of disease.  Patient also had a mild pancytopenia and increased fatigue.  He agreed to weekly Rituxan  x 4 for control of disease.  Patient completed week 4 of treatment on February 03, 2023.  Repeat imaging on March 18, 2023 reviewed independently with a mixed response to therapy.  Because of this, patient agreed to maintenance Rituxan  every 8 weeks for a total of 2 years.  Repeat CT scan on Aug 18, 2023 reviewed independently with again a mixed response to therapy.  Further imaging with PET scan on November 24, 2023 reviewed independently and report as above with significant progression of disease.  Will discontinue maintenance Rituxan  and proceed with Treanda and Rituxan  every 28 days.  Return to clinic on December 29, 2023 to initiate cycle 1, day 1 of treatment.   Axillary lymphadenopathy: Patient has appointment with radiation oncology later today, but will proceed with systemic treatment as above.   Anemia: Patient's most recent hemoglobin has trended down slightly to 11.8.  He has a mildly decreased iron saturation ratio, otherwise his iron panel is within normal limits. Thrombocytopenia: Chronic and unchanged.   History of reaction to Rituxan : Likely rate based.  Additional premeds have been added for the remainder of his treatments. Hearing loss/fluid: Resolved with tube placement by ENT.  I spent a total of 30 minutes reviewing chart data, face-to-face evaluation with the patient, counseling and coordination of care as detailed above.    Patient expressed understanding and was in agreement with this plan. He also understands that He can call clinic at any time  with any questions, concerns, or complaints.    Evalene JINNY Reusing, MD   12/21/2023 1:42 PM

## 2023-12-21 NOTE — Progress Notes (Signed)
 Radiation Oncology Follow up Note  Name: Sean Raymond   Date:   12/21/2023 MRN:  [id number] DOB: 30-Oct-1945    This 78 y.o. 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  He is seen today for evaluation of persistent left axillary node..  Patient was initially was thought to be able to receive palliative radiation therapy to this area although recent PET CT scan shows marked worsening lymphadenopathy including multiple areas including spleen pancreas chest abdomen wall subcutaneous tissue as well as left axilla.  He is fairly asymptomatic does have some night sweats.  Left axilla is not that symptomatic.  COMPLICATIONS OF TREATMENT: none  FOLLOW UP COMPLIANCE: keeps appointments   PHYSICAL EXAM:  There were no vitals taken for this visit. Patient has marked 5 cm left axillary mass no other peripheral adenopathy is identified.  Well-developed well-nourished patient in NAD. HEENT reveals PERLA, EOMI, discs not visualized.  Oral cavity is clear. No oral mucosal lesions are identified. Neck is clear without evidence of cervical or supraclavicular adenopathy. Lungs are clear to A&P. Cardiac examination is essentially unremarkable with regular rate and rhythm without murmur rub or thrill. Abdomen is benign with no organomegaly or masses noted. Motor sensory and DTR levels are equal and symmetric in the upper and lower extremities. Cranial nerves II through XII are grossly intact. Proprioception is intact. No peripheral adenopathy or edema is identified. No motor or sensory  levels are noted. Crude visual fields are within normal range.  RADIOLOGY RESULTS: PET scan reviewed compatible with above-stated findings  PLAN: At this time patient will benefit from more systemic treatment under Dr. Jerone direction.  I would reserve palliative radiation therapy should this area progress or not respond over time.  It would be a short course of palliative treatment over 10 treatments.  Patient comprehensive and recommendations well.  He is already seen Dr. Jacobo and then planning on another course of systemic treatment.  Patient knows to call with any concerns.  I would like to take this opportunity to thank you for allowing me to participate in the care of your patient.SABRA Marcey Penton, MD

## 2023-12-21 NOTE — Progress Notes (Signed)
 DISCONTINUE OFF PATHWAY REGIMEN - Lymphoma and CLL   [id number]:Rituximab  IV/SUBQ D1 q56 Days:   Cycle 1: A cycle is 56 days:     Rituximab -xxxx    Cycles 2 and beyond: A cycle is every 56 days:     Rituximab  and hyaluronidase human   **Always confirm dose/schedule in your pharmacy ordering system**  PRIOR TREATMENT: Off Pathway: Rituximab  IV/SUBQ D1 q56 Days  START ON PATHWAY REGIMEN - Lymphoma and CLL     A cycle is every 28 days:     Rituximab -xxxx      Bendamustine   **Always confirm dose/schedule in your pharmacy ordering system**  Patient Characteristics: Follicular Lymphoma, Grades 1, 2, and 3A, Second Line, Prior Treatment with Rituximab  Alone, Relapse < 24 Months or High Disease Burden Disease Type: Follicular Lymphoma, Grade 1, 2, or 3A Disease Type: Not Applicable Disease Type: Not Applicable Line of Therapy: Second Line Prior Treatment: Prior Treatment with Rituximab  Alone Intent of Therapy: Non-Curative / Palliative Intent, Discussed with Patient

## 2023-12-22 ENCOUNTER — Other Ambulatory Visit: Payer: Self-pay | Admitting: Oncology

## 2023-12-22 NOTE — Progress Notes (Signed)
 Pharmacist Chemotherapy Monitoring - Initial Assessment    Anticipated start date: 12/29/23   The following has been reviewed per standard work regarding the patient's treatment regimen: The patient's diagnosis, treatment plan and drug doses, and organ/hematologic function Lab orders and baseline tests specific to treatment regimen  The treatment plan start date, drug sequencing, and pre-medications Prior authorization status  Patient's documented medication list, including drug-drug interaction screen and prescriptions for anti-emetics and supportive care specific to the treatment regimen The drug concentrations, fluid compatibility, administration routes, and timing of the medications to be used The patient's access for treatment and lifetime cumulative dose history, if applicable  The patient's medication allergies and previous infusion related reactions, if applicable   Changes made to treatment plan:  pre-medications added famotidine  due to previous rxn  Follow up needed:  N/A   Maudie FORBES Andreas, PharmD, BCPS Clinical Pharmacist   12/22/2023  1:39 PM

## 2023-12-23 ENCOUNTER — Encounter: Payer: Self-pay | Admitting: Oncology

## 2023-12-25 ENCOUNTER — Other Ambulatory Visit: Payer: Self-pay

## 2023-12-28 ENCOUNTER — Encounter: Payer: Self-pay | Admitting: Oncology

## 2023-12-28 NOTE — Progress Notes (Signed)
 Pharmacist Chemotherapy Monitoring - Initial Assessment    Anticipated start date: 12/29/23  The following has been reviewed per standard work regarding the patient's treatment regimen: The patient's diagnosis, treatment plan and drug doses, and organ/hematologic function Lab orders and baseline tests specific to treatment regimen  The treatment plan start date, drug sequencing, and pre-medications Prior authorization status  Patient's documented medication list, including drug-drug interaction screen and prescriptions for anti-emetics and supportive care specific to the treatment regimen The drug concentrations, fluid compatibility, administration routes, and timing of the medications to be used The patient's access for treatment and lifetime cumulative dose history, if applicable  The patient's medication allergies and previous infusion related reactions, if applicable   Changes made to treatment plan:  N/A  Follow up needed:  Patient had a prior reaction to rituxan  but was able to complete infusion after solumedrol and demerol  Watch for any repeat reaction   Sean Raymond, RPH, 12/28/2023  2:09 PM

## 2023-12-29 ENCOUNTER — Inpatient Hospital Stay

## 2023-12-29 ENCOUNTER — Inpatient Hospital Stay (HOSPITAL_BASED_OUTPATIENT_CLINIC_OR_DEPARTMENT_OTHER): Admitting: Oncology

## 2023-12-29 ENCOUNTER — Encounter: Payer: Self-pay | Admitting: Oncology

## 2023-12-29 VITALS — BP 127/73 | HR 107 | Temp 97.8°F | Resp 18 | Ht 69.0 in | Wt 193.0 lb

## 2023-12-29 VITALS — BP 115/68 | HR 93 | Temp 97.9°F

## 2023-12-29 DIAGNOSIS — C8218 Follicular lymphoma grade II, lymph nodes of multiple sites: Secondary | ICD-10-CM | POA: Diagnosis not present

## 2023-12-29 DIAGNOSIS — Z5112 Encounter for antineoplastic immunotherapy: Secondary | ICD-10-CM | POA: Diagnosis not present

## 2023-12-29 LAB — CMP (CANCER CENTER ONLY)
ALT: 13 U/L (ref 0–44)
AST: 48 U/L — ABNORMAL HIGH (ref 15–41)
Albumin: 3.5 g/dL (ref 3.5–5.0)
Alkaline Phosphatase: 73 U/L (ref 38–126)
Anion gap: 10 (ref 5–15)
BUN: 14 mg/dL (ref 8–23)
CO2: 26 mmol/L (ref 22–32)
Calcium: 8.9 mg/dL (ref 8.9–10.3)
Chloride: 98 mmol/L (ref 98–111)
Creatinine: 0.9 mg/dL (ref 0.61–1.24)
GFR, Estimated: >60 mL/min (ref >60)
Glucose, Bld: 119 mg/dL — ABNORMAL HIGH (ref 70–99)
Potassium: 3.7 mmol/L (ref 3.5–5.1)
Sodium: 134 mmol/L — ABNORMAL LOW (ref 135–145)
Total Bilirubin: 0.8 mg/dL (ref 0.0–1.2)
Total Protein: 6 g/dL — ABNORMAL LOW (ref 6.5–8.1)

## 2023-12-29 LAB — CBC WITH DIFFERENTIAL (CANCER CENTER ONLY)
Abs Immature Granulocytes: 0.02 K/uL (ref 0.00–0.07)
Basophils Absolute: 0 K/uL (ref 0.0–0.1)
Basophils Relative: 1 %
Eosinophils Absolute: 0.1 K/uL (ref 0.0–0.5)
Eosinophils Relative: 1 %
HCT: 34.7 % — ABNORMAL LOW (ref 39.0–52.0)
Hemoglobin: 11.4 g/dL — ABNORMAL LOW (ref 13.0–17.0)
Immature Granulocytes: 0 %
Lymphocytes Relative: 12 %
Lymphs Abs: 0.7 K/uL (ref 0.7–4.0)
MCH: 28.4 pg (ref 26.0–34.0)
MCHC: 32.9 g/dL (ref 30.0–36.0)
MCV: 86.5 fL (ref 80.0–100.0)
Monocytes Absolute: 0.9 K/uL (ref 0.1–1.0)
Monocytes Relative: 16 %
Neutro Abs: 4.1 K/uL (ref 1.7–7.7)
Neutrophils Relative %: 70 %
Platelet Count: 149 K/uL — ABNORMAL LOW (ref 150–400)
RBC: 4.01 MIL/uL — ABNORMAL LOW (ref 4.22–5.81)
RDW: 12.4 % (ref 11.5–15.5)
WBC Count: 5.9 K/uL (ref 4.0–10.5)
nRBC: 0 % (ref 0.0–0.2)

## 2023-12-29 MED ORDER — DEXAMETHASONE SODIUM PHOSPHATE 10 MG/ML IJ SOLN
10.0000 mg | Freq: Once | INTRAMUSCULAR | Status: AC
  Administered 2023-12-29: 10 mg via INTRAVENOUS
  Filled 2023-12-29: qty 1

## 2023-12-29 MED ORDER — ACETAMINOPHEN 325 MG PO TABS
650.0000 mg | ORAL_TABLET | Freq: Once | ORAL | Status: AC
  Administered 2023-12-29: 650 mg via ORAL
  Filled 2023-12-29: qty 2

## 2023-12-29 MED ORDER — SODIUM CHLORIDE 0.9 % IV SOLN
375.0000 mg/m2 | Freq: Once | INTRAVENOUS | Status: AC
  Administered 2023-12-29: 800 mg via INTRAVENOUS
  Filled 2023-12-29: qty 50

## 2023-12-29 MED ORDER — DIPHENHYDRAMINE HCL 25 MG PO CAPS
25.0000 mg | ORAL_CAPSULE | Freq: Once | ORAL | Status: AC
  Administered 2023-12-29: 25 mg via ORAL
  Filled 2023-12-29: qty 1

## 2023-12-29 MED ORDER — FAMOTIDINE IN NACL 20-0.9 MG/50ML-% IV SOLN
20.0000 mg | Freq: Once | INTRAVENOUS | Status: AC
  Administered 2023-12-29: 20 mg via INTRAVENOUS
  Filled 2023-12-29: qty 50

## 2023-12-29 MED ORDER — SODIUM CHLORIDE 0.9 % IV SOLN
90.0000 mg/m2 | Freq: Once | INTRAVENOUS | Status: AC
  Administered 2023-12-29: 200 mg via INTRAVENOUS
  Filled 2023-12-29: qty 8

## 2023-12-29 MED ORDER — SODIUM CHLORIDE 0.9 % IV SOLN
INTRAVENOUS | Status: DC
  Filled 2023-12-29: qty 250

## 2023-12-29 MED ORDER — PALONOSETRON HCL INJECTION 0.25 MG/5ML
0.2500 mg | Freq: Once | INTRAVENOUS | Status: AC
  Administered 2023-12-29: 0.25 mg via INTRAVENOUS
  Filled 2023-12-29: qty 5

## 2023-12-29 NOTE — Progress Notes (Signed)
 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  INTERVAL HISTORY: Patient returns to clinic today for further evaluation and consideration of cycle 1, day 1 of Rituxan  and Treanda .  He has slightly more fatigue, but otherwise feels well.  His weight loss has been intentional.  He denies any fevers or night sweats.  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 He has no other neurologic complaints. He denies any chest pain, shortness of breath, cough, or hemoptysis.  He denies any nausea, vomiting, constipation, or diarrhea.  He has no urinary complaints.  Patient offers no further specific complaints today.  REVIEW OF SYSTEMS:   Review of Systems  Constitutional:  Positive for malaise/fatigue and weight loss. Negative for diaphoresis and fever.  Eyes:  Positive for double vision. Negative for blurred vision and pain.  Respiratory: Negative.  Negative for cough and shortness of breath.   Cardiovascular: Negative.  Negative for chest pain and leg swelling.  Gastrointestinal: Negative.  Negative for abdominal pain.  Genitourinary: Negative.  Negative for dysuria.  Musculoskeletal: Negative.  Negative for neck pain.  Skin: Negative.  Negative for rash.  Neurological: Negative.  Negative for sensory change, focal weakness, weakness and headaches.  Psychiatric/Behavioral: Negative.  The patient is not nervous/anxious.     As per HPI. Otherwise, a complete review of systems is negative.  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  FAMILY HISTORY: Colon cancer and lung cancer.     ADVANCED DIRECTIVES:    HEALTH MAINTENANCE: Social History   Tobacco Use   Smoking status: Never   Smokeless tobacco: Never  Vaping Use   Vaping status: Never Used  Substance Use Topics   Alcohol use: Yes    Alcohol/week: 2.0 standard drinks of alcohol    Types: 2 Cans of beer per week    Comment: occasional   Drug use: No     Allergies  Allergen Reactions   Rituximab -Pvvr Other (See Comments)    Patient had a hypersensitivity to Rituximab . See progress note on 01/13/23. Patient able to complete infusion.   Tape Rash    bandaids    Current Outpatient Medications  Medication Sig Dispense Refill   acyclovir  (ZOVIRAX ) 400 MG tablet Take 1 tablet (400 mg total) by mouth daily. 30 tablet 3   alfuzosin (UROXATRAL) 10 MG 24 hr tablet Take 10 mg by mouth daily.     allopurinol  (ZYLOPRIM ) 300 MG tablet Take 1 tablet (300 mg total) by mouth daily. 30 tablet 3   amLODipine (NORVASC)  5 MG tablet Take 5 mg by mouth daily.      atorvastatin (LIPITOR) 10 MG tablet Take 10 mg by mouth daily at 6 PM.      Calcium Carbonate-Vitamin D 600-400 MG-UNIT per tablet Take 1 tablet by mouth daily.      cetirizine (ZYRTEC) 10 MG tablet Take 10 mg by mouth daily.     ciprofloxacin -dexamethasone  (CIPRODEX ) OTIC suspension Place 4 drops into both ears 2 (two) times daily for 5 days. DOS 10/22/23. 7.5 mL 0   Fiber Gummies 2 g CHEW Chew by mouth.     imipramine (TOFRANIL) 25 MG tablet Take 25 mg by mouth at bedtime.     Multiple Vitamin  (MULTI-VITAMINS) TABS Take 1 tablet by mouth daily.      omeprazole (PRILOSEC) 40 MG capsule Take 40 mg by mouth daily.      ondansetron  (ZOFRAN ) 8 MG tablet Take 1 tablet (8 mg total) by mouth every 8 (eight) hours as needed for nausea or vomiting. Start on the third day after chemotherapy. 60 tablet 2   prochlorperazine  (COMPAZINE ) 10 MG tablet Take 1 tablet (10 mg total) by mouth every 6 (six) hours as needed for nausea or vomiting. 60 tablet 2   tadalafil (CIALIS) 20 MG tablet Take 10 mg by mouth daily as needed.     aspirin EC 81 MG tablet Take 81 mg by mouth daily.  (Patient not taking: Reported on 12/29/2023)     No current facility-administered medications for this visit.    OBJECTIVE: Vitals:   12/29/23 0921  BP: 127/73  Pulse: (!) 107  Resp: 18  Temp: 97.8 F (36.6 C)  SpO2: 95%       Body mass index is 28.5 kg/m.    ECOG FS:0 - Asymptomatic  General: Well-developed, well-nourished, no acute distress. Eyes: Pink conjunctiva, anicteric sclera. HEENT: Normocephalic, moist mucous membranes. Lungs: No audible wheezing or coughing. Heart: Regular rate and rhythm. Abdomen: Soft, nontender, no obvious distention. Musculoskeletal: No edema, cyanosis, or clubbing. Neuro: Alert, answering all questions appropriately. Cranial nerves grossly intact. Skin: No rashes or petechiae noted. Psych: Normal affect. Lymphatics: Easily palpable 4 to 5 cm lymph node in left axilla.  LAB RESULTS:  Lab Results  Component Value Date   NA 134 (L) 12/29/2023   K 3.7 12/29/2023   CL 98 12/29/2023   CO2 26 12/29/2023   GLUCOSE 119 (H) 12/29/2023   BUN 14 12/29/2023   CREATININE 0.90 12/29/2023   CALCIUM 8.9 12/29/2023   PROT 6.0 (L) 12/29/2023   ALBUMIN 3.5 12/29/2023   AST 48 (H) 12/29/2023   ALT 13 12/29/2023   ALKPHOS 73 12/29/2023   BILITOT 0.8 12/29/2023   GFRNONAA >60 12/29/2023   GFRAA >60 12/21/2019    Lab Results  Component Value Date   WBC 5.9 12/29/2023   NEUTROABS  4.1 12/29/2023   HGB 11.4 (L) 12/29/2023   HCT 34.7 (L) 12/29/2023   MCV 86.5 12/29/2023   PLT 149 (L) 12/29/2023     STUDIES: NM PET Image Restag (PS) Skull Base To Thigh Result Date: 12/19/2023 EXAM: PET AND CT SKULL BASE TO MID THIGH 12/14/2023 09:14:45 AM TECHNIQUE: PET imaging was acquired from the base of the skull to the mid thighs. Non-contrast enhanced computed tomography was obtained for attenuation correction and anatomic localization. RADIOPHARMACEUTICAL: 11.06 mCi F-18 FDG Uptake time 60 minutes. Glucose level 96 mg/dl. COMPARISON: CT 08/18/2023 CLINICAL HISTORY: Worsening lymphadenopathy on treatment, lymphoma. FINDINGS: HEAD AND NECK: Nasopharyngeal mass centered  around the right tonsillar region is again noted measuring 3.6 x 2.5 cm with SUV max of 20.6, axial image 12. Previously this measured 2.2 x 2.9 cm. Bilateral tracer avid cervical lymph nodes. The dominant lymph node is in the right level 2 region measures 1.8 cm with SUV max of 17.3, axial image 22. On the previous examination this measured 1.2 cm. CHEST: Bilateral tracer avid axillary lymph nodes are identified. Index lymph node within the left axilla measures 5.6 cm with SUV max of 29.9, axial image 54. Previously this measured 3.4 cm. Tracer avid high right paratracheal lymph node measures 1.5 cm with SUV max of 12.4, axial image 36. Previously 0.9 cm. No tracer avid pulmonary nodule or mass. ABDOMEN AND PELVIS: No abnormal tracer uptake identified within the liver. Extensive tracer uptake adenopathy within the abdomen, pelvis, and inguinal regions : -There is diffuse soft tissue stranding about the pancreas. The pancreatic parenchyma appears mildly edematous. Nodal mass within the upper abdomen which appears to involve the body and tail of pancreas measures 8.1 x 5.8 cm and has an SUV max of 20.0, axial image 81. On the previous exam there were 3 distinct lymph nodes in this area which measured up to 1.3 cm. -Index left  retroperitoneal node measures 3.6 cm with SUV max of 17.0. This is new compared with the previous exam. -Porta caval lymph node measures 2.1 cm with SUV max of 12.2, axial image 85. On the previous exam this measured 1.4 cm. - 1.2 cm left mesenteric lymph node with SUV max of 6.7, axial image 112. New from the previous exam. -Large tracer avid left external iliac node measures 7.6 cm with SUV max of 20.7, axial image 131. On the previous exam this measured 2.4 cm. -Index node measures 1.6 cm on the left with SUV max of 9.9, axial image 158. Previously this measured 0.7 cm. The spleen measures 16.2 cm in length containing multiple tracer avid lesions. Index lesion within the posterior spleen measures 2.0 cm with SUV max of 19.9, axial image 82. On the prior exam the spleen measured 13.6 cm. The index lesion within the posterior spleen measured 1.3 cm. Bilateral very low attenuation adrenal nodules are compatible with benign adenomas. The largest is on the left measuring 2.8 cm. These are similar to the previous exam. Circumferential wall thickening of the urinary bladder with surrounding haziness. There is a diverticulum of the right side of bladder dome measuring 2.2 cm. Prostate gland is enlarged with mass effect upon the base of bladder. BONES AND SOFT TISSUE: No abnormal FDG activity localizes to the bones. Scattered areas of hazy soft tissue infiltration with mild increased radiotracer uptake is identified throughout the chest and abdominal wall with scattered nodular areas of increased uptake. For example, along the left posterior chest wall there is a focal area of increased uptake with SUV max of 3.3, axial image 39. with SUV max of 10.7, axial image 13. No metabolically active aggressive osseous lesion. IMPRESSION: 1. Worsening lymphadenopathy and splenic involvement, consistent with progression of grade 2 follicular lymphoma. 2. Mildly edematous pancreatic parenchyma with diffuse soft tissue stranding and  nodal mass involving the body and tail of pancreas, measuring 8.1 x 5.8 cm with SUV max 20.0. Findings are concerning for pancreas involvement by lymphoma. Superimposed acute pancreatitis cannot be excluded. 3. Increased radiotracer uptake throughout the chest and abdominal wall subcutaneous soft tissues with nodular areas of mild increased uptake. Cannot exclude cutaneous involvement by lymphoma. 4. Enlarged prostate gland  with mass effect upon the base of bladder. Electronically signed by: Waddell Calk MD 12/19/2023 07:30 AM EDT RP Workstation: [id number]    ONCOLOGY HISTORY: Patient initially received treatment over 15 years ago with R-[facility] in [place] .  He then underwent rituximab  and Zevalin therapy in June 2014.  He did not require any treatment until imaging with CT scan on December 28, 2022 reported continued progression of disease.  Patient also had a mild pancytopenia and increased fatigue.  He agreed to weekly Rituxan  x 4 for control of disease.  Patient completed week 4 of treatment on February 03, 2023.  Repeat imaging on March 18, 2023 reviewed independently with a mixed response to therapy.  Because of this, patient agreed to maintenance Rituxan  every 8 weeks for a total of 2 years.  Repeat CT scan on Aug 18, 2023 reviewed independently with again a mixed response to therapy.  Further imaging with PET scan on November 24, 2023 reviewed independently and report as above with significant progression of disease.  Patient subsequently initiated Rituxan  and Treanda  on December 29, 2023.  ASSESSMENT: Progressive grade 2 follicular lymphoma.  PLAN:    Progressive grade 2 follicular lymphoma: See oncology history as above.  PET scan on November 24, 2023 revealed significant progression of disease.  Proceed with Treanda  and Rituxan  on day 1 with Treanda  on day 2 every 28 days.  Plan to do 4 cycles and then reimage.  Proceed with cycle 1, day 1 of treatment today.  Return to clinic tomorrow for  Treanda  only, in 1 week for laboratory work and further evaluation, and then in 4 weeks for further evaluation and consideration of cycle 2, day 1.     Axillary lymphadenopathy: Systemic treatment as above.  Can consider XRT in the future if necessary.   Anemia: Hemoglobin is 11.4 today.  Monitor.  He has a mildly decreased iron saturation ratio, otherwise his iron panel is within normal limits. Thrombocytopenia: Mild, monitor. History of reaction to Rituxan : Likely rate based.  Additional premeds have been added for the remainder of his treatments. Hearing loss/fluid: Resolved with tube placement by ENT.   I spent a total of 30 minutes reviewing chart data, face-to-face evaluation with the patient, counseling and coordination of care as detailed above.   Patient expressed understanding and was in agreement with this plan. He also understands that He can call clinic at any time with any questions, concerns, or complaints.    Evalene JINNY Reusing, MD   12/29/2023 9:27 AM

## 2023-12-29 NOTE — Progress Notes (Signed)
 Patient has been feeling a little bit more tired lately.

## 2023-12-30 ENCOUNTER — Inpatient Hospital Stay

## 2023-12-30 ENCOUNTER — Ambulatory Visit

## 2023-12-30 VITALS — BP 116/57 | HR 95 | Temp 97.0°F | Resp 18

## 2023-12-30 DIAGNOSIS — Z5112 Encounter for antineoplastic immunotherapy: Secondary | ICD-10-CM | POA: Diagnosis not present

## 2023-12-30 DIAGNOSIS — C8218 Follicular lymphoma grade II, lymph nodes of multiple sites: Secondary | ICD-10-CM

## 2023-12-30 MED ORDER — SODIUM CHLORIDE 0.9 % IV SOLN
90.0000 mg/m2 | Freq: Once | INTRAVENOUS | Status: AC
  Administered 2023-12-30: 200 mg via INTRAVENOUS
  Filled 2023-12-30: qty 8

## 2023-12-30 MED ORDER — SODIUM CHLORIDE 0.9 % IV SOLN
INTRAVENOUS | Status: DC
  Filled 2023-12-30: qty 250

## 2023-12-30 MED ORDER — DEXAMETHASONE SODIUM PHOSPHATE 10 MG/ML IJ SOLN
10.0000 mg | Freq: Once | INTRAMUSCULAR | Status: AC
  Administered 2023-12-30: 10 mg via INTRAVENOUS
  Filled 2023-12-30: qty 1

## 2023-12-30 NOTE — Patient Instructions (Signed)
 [facility] - A DEPT OF [facility]  Discharge Instructions: Thank you for choosing [facility] to provide your oncology and hematology care.  If you have a lab appointment with the Cancer Center, please go directly to the Cancer Center and check in at the registration area.  Wear comfortable clothing and clothing appropriate for easy access to any Portacath or PICC line.   We strive to give you quality time with your provider. You may need to reschedule your appointment if you arrive late (15 or more minutes).  Arriving late affects you and other patients whose appointments are after yours.  Also, if you miss three or more appointments without notifying the office, you may be dismissed from the clinic at the provider's discretion.      For prescription refill requests, have your pharmacy contact our office and allow 72 hours for refills to be completed.    Today you received the following chemotherapy and/or immunotherapy agents- [email or web address]      To help prevent nausea and vomiting after your treatment, we encourage you to take your nausea medication as directed.  BELOW ARE SYMPTOMS THAT SHOULD BE REPORTED IMMEDIATELY: *FEVER GREATER THAN 100.4 F (38 C) OR HIGHER *CHILLS OR SWEATING *NAUSEA AND VOMITING THAT IS NOT CONTROLLED WITH YOUR NAUSEA MEDICATION *UNUSUAL SHORTNESS OF BREATH *UNUSUAL BRUISING OR BLEEDING *URINARY PROBLEMS (pain or burning when urinating, or frequent urination) *BOWEL PROBLEMS (unusual diarrhea, constipation, pain near the anus) TENDERNESS IN MOUTH AND THROAT WITH OR WITHOUT PRESENCE OF ULCERS (sore throat, sores in mouth, or a toothache) UNUSUAL RASH, SWELLING OR PAIN  UNUSUAL VAGINAL DISCHARGE OR ITCHING   Items with * indicate a potential emergency and should be followed up as soon as possible or go to the Emergency Department if any problems should occur.  Please show the CHEMOTHERAPY ALERT CARD or IMMUNOTHERAPY  ALERT CARD at check-in to the Emergency Department and triage nurse.  Should you have questions after your visit or need to cancel or reschedule your appointment, please contact [facility] - A DEPT OF [facility]  [phone] and follow the prompts.  Office hours are 8:00 a.m. to 4:30 p.m. Monday - Friday. Please note that voicemails left after 4:00 p.m. may not be returned until the following business day.  We are closed weekends and major holidays. You have access to a nurse at all times for urgent questions. Please call the main number to the clinic [phone] and follow the prompts.  For any non-urgent questions, you may also contact your provider using MyChart. We now offer e-Visits for anyone 35 and older to request care online for non-urgent symptoms. For details visit [email or web address].   Also download the MyChart app! Go to the app store, search "MyChart", open the app, select [place], and log in with your MyChart username and password.

## 2024-01-05 ENCOUNTER — Inpatient Hospital Stay

## 2024-01-05 ENCOUNTER — Inpatient Hospital Stay: Attending: Oncology | Admitting: Oncology

## 2024-01-05 ENCOUNTER — Inpatient Hospital Stay: Admitting: Oncology

## 2024-01-05 ENCOUNTER — Encounter: Payer: Self-pay | Admitting: Oncology

## 2024-01-05 VITALS — BP 112/62 | HR 88 | Temp 97.3°F | Resp 18 | Ht 69.0 in | Wt 191.0 lb

## 2024-01-05 DIAGNOSIS — Z79630 Long term (current) use of alkylating agent: Secondary | ICD-10-CM | POA: Insufficient documentation

## 2024-01-05 DIAGNOSIS — Z5112 Encounter for antineoplastic immunotherapy: Secondary | ICD-10-CM | POA: Insufficient documentation

## 2024-01-05 DIAGNOSIS — C8218 Follicular lymphoma grade II, lymph nodes of multiple sites: Secondary | ICD-10-CM

## 2024-01-05 DIAGNOSIS — C8210 Follicular lymphoma grade II, unspecified site: Secondary | ICD-10-CM | POA: Insufficient documentation

## 2024-01-05 DIAGNOSIS — E871 Hypo-osmolality and hyponatremia: Secondary | ICD-10-CM | POA: Insufficient documentation

## 2024-01-05 DIAGNOSIS — Z79620 Long term (current) use of immunosuppressive biologic: Secondary | ICD-10-CM | POA: Insufficient documentation

## 2024-01-05 DIAGNOSIS — R590 Localized enlarged lymph nodes: Secondary | ICD-10-CM | POA: Diagnosis not present

## 2024-01-05 LAB — CBC WITH DIFFERENTIAL/PLATELET
Abs Immature Granulocytes: 0.04 K/uL (ref 0.00–0.07)
Basophils Absolute: 0 K/uL (ref 0.0–0.1)
Basophils Relative: 1 %
Eosinophils Absolute: 0.2 K/uL (ref 0.0–0.5)
Eosinophils Relative: 3 %
HCT: 33.7 % — ABNORMAL LOW (ref 39.0–52.0)
Hemoglobin: 11.3 g/dL — ABNORMAL LOW (ref 13.0–17.0)
Immature Granulocytes: 1 %
Lymphocytes Relative: 5 %
Lymphs Abs: 0.3 K/uL — ABNORMAL LOW (ref 0.7–4.0)
MCH: 28.9 pg (ref 26.0–34.0)
MCHC: 33.5 g/dL (ref 30.0–36.0)
MCV: 86.2 fL (ref 80.0–100.0)
Monocytes Absolute: 0.9 K/uL (ref 0.1–1.0)
Monocytes Relative: 16 %
Neutro Abs: 4.2 K/uL (ref 1.7–7.7)
Neutrophils Relative %: 74 %
Platelets: 164 K/uL (ref 150–400)
RBC: 3.91 MIL/uL — ABNORMAL LOW (ref 4.22–5.81)
RDW: 12.9 % (ref 11.5–15.5)
WBC: 5.6 K/uL (ref 4.0–10.5)
nRBC: 0 % (ref 0.0–0.2)

## 2024-01-05 LAB — CMP (CANCER CENTER ONLY)
ALT: 13 U/L (ref 0–44)
AST: 24 U/L (ref 15–41)
Albumin: 3.6 g/dL (ref 3.5–5.0)
Alkaline Phosphatase: 70 U/L (ref 38–126)
Anion gap: 6 (ref 5–15)
BUN: 14 mg/dL (ref 8–23)
CO2: 26 mmol/L (ref 22–32)
Calcium: 9.1 mg/dL (ref 8.9–10.3)
Chloride: 100 mmol/L (ref 98–111)
Creatinine: 0.92 mg/dL (ref 0.61–1.24)
GFR, Estimated: >60 mL/min (ref >60)
Glucose, Bld: 103 mg/dL — ABNORMAL HIGH (ref 70–99)
Potassium: 3.9 mmol/L (ref 3.5–5.1)
Sodium: 132 mmol/L — ABNORMAL LOW (ref 135–145)
Total Bilirubin: 0.7 mg/dL (ref 0.0–1.2)
Total Protein: 5.8 g/dL — ABNORMAL LOW (ref 6.5–8.1)

## 2024-01-05 LAB — HEPATITIS B SURFACE ANTIGEN: Hepatitis B Surface Ag: NONREACTIVE

## 2024-01-05 NOTE — Progress Notes (Unsigned)
 Patient has a dental appointment this month and would like to know is he is able to get his teeth cleaned, or wait until done with chemo?

## 2024-01-05 NOTE — Progress Notes (Unsigned)
 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  INTERVAL HISTORY: Patient returns to clinic today for further evaluation and to assess his toleration of cycle 1 of rituximab  Treanda .  He had some mild nausea and fatigue, but otherwise tolerated his treatment well. 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 He has no other neurologic complaints.  He denies any recent fevers or illnesses.  He has a good appetite and denies weight loss.  He denies any chest pain, shortness of breath, cough, or hemoptysis.  He denies any nausea, vomiting, constipation, or diarrhea.  He has no urinary complaints.  Patient offers no further specific complaints today.  REVIEW OF SYSTEMS:   Review of Systems  Constitutional: Negative.  Negative for diaphoresis, fever, malaise/fatigue and weight loss.  Eyes:  Positive for double vision. Negative for blurred vision and pain.  Respiratory: Negative.  Negative for cough and shortness of breath.   Cardiovascular: Negative.  Negative for chest pain and leg swelling.  Gastrointestinal: Negative.  Negative for abdominal pain.  Genitourinary: Negative.  Negative for dysuria.  Musculoskeletal: Negative.  Negative for neck pain.  Skin: Negative.  Negative for rash.  Neurological: Negative.  Negative for sensory change, focal weakness, weakness and headaches.  Psychiatric/Behavioral: Negative.  The patient is not nervous/anxious.     As per HPI. Otherwise, a complete review of systems is negative.  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  FAMILY HISTORY: Colon cancer and lung cancer.     ADVANCED DIRECTIVES:    HEALTH MAINTENANCE: Social History   Tobacco Use   Smoking status: Never   Smokeless tobacco: Never  Vaping Use   Vaping status: Never Used  Substance Use Topics   Alcohol use: Yes    Alcohol/week: 2.0 standard drinks of alcohol    Types: 2 Cans of beer per week    Comment: occasional   Drug use: No     Allergies  Allergen Reactions   Rituximab -Pvvr Other (See Comments)    Patient had a hypersensitivity to Rituximab . See progress note on 01/13/23. Patient able to complete infusion.   Tape Rash    bandaids    Current Outpatient Medications  Medication Sig Dispense Refill   acyclovir  (ZOVIRAX ) 400 MG tablet Take 1 tablet (400 mg total) by mouth daily. 30 tablet 3   alfuzosin (UROXATRAL) 10 MG 24 hr tablet Take 10 mg by mouth daily.     allopurinol  (ZYLOPRIM ) 300 MG tablet Take 1 tablet (300 mg total) by mouth daily. 30 tablet  3   amLODipine (NORVASC) 5 MG tablet Take 5 mg by mouth daily.      atorvastatin (LIPITOR) 10 MG tablet Take 10 mg by mouth daily at 6 PM.      Calcium Carbonate-Vitamin D 600-400 MG-UNIT per tablet Take 1 tablet by mouth daily.      cetirizine (ZYRTEC) 10 MG tablet Take 10 mg by mouth daily.     ciprofloxacin -dexamethasone  (CIPRODEX ) OTIC suspension Place 4 drops into both ears 2 (two) times daily for 5 days. DOS 10/22/23. 7.5 mL 0   Fiber Gummies 2 g CHEW Chew by mouth.     imipramine (TOFRANIL) 25 MG tablet Take 25 mg by  mouth at bedtime.     Multiple Vitamin (MULTI-VITAMINS) TABS Take 1 tablet by mouth daily.      omeprazole (PRILOSEC) 40 MG capsule Take 40 mg by mouth daily.      ondansetron  (ZOFRAN ) 8 MG tablet Take 1 tablet (8 mg total) by mouth every 8 (eight) hours as needed for nausea or vomiting. Start on the third day after chemotherapy. 60 tablet 2   prochlorperazine  (COMPAZINE ) 10 MG tablet Take 1 tablet (10 mg total) by mouth every 6 (six) hours as needed for nausea or vomiting. 60 tablet 2   tadalafil (CIALIS) 20 MG tablet Take 10 mg by mouth daily as needed.     aspirin EC 81 MG tablet Take 81 mg by mouth daily.  (Patient not taking: Reported on 01/05/2024)     No current facility-administered medications for this visit.    OBJECTIVE: Vitals:   01/05/24 1357  BP: 112/62  Pulse: 88  Resp: 18  Temp: (!) 97.3 F (36.3 C)  SpO2: 100%       Body mass index is 28.21 kg/m.    ECOG FS:0 - Asymptomatic  General: Well-developed, well-nourished, no acute distress. Eyes: Pink conjunctiva, anicteric sclera. HEENT: Normocephalic, moist mucous membranes. Lungs: No audible wheezing or coughing. Heart: Regular rate and rhythm. Abdomen: Soft, nontender, no obvious distention. Musculoskeletal: No edema, cyanosis, or clubbing. Neuro: Alert, answering all questions appropriately. Cranial nerves grossly intact. Skin: No rashes or petechiae noted. Psych: Normal affect. Lymphatics: Left axillary lymph node decreased in size.  LAB RESULTS:  Lab Results  Component Value Date   NA 132 (L) 01/05/2024   K 3.9 01/05/2024   CL 100 01/05/2024   CO2 26 01/05/2024   GLUCOSE 103 (H) 01/05/2024   BUN 14 01/05/2024   CREATININE 0.92 01/05/2024   CALCIUM 9.1 01/05/2024   PROT 5.8 (L) 01/05/2024   ALBUMIN 3.6 01/05/2024   AST 24 01/05/2024   ALT 13 01/05/2024   ALKPHOS 70 01/05/2024   BILITOT 0.7 01/05/2024   GFRNONAA >60 01/05/2024   GFRAA >60 12/21/2019    Lab Results  Component Value Date   WBC  5.6 01/05/2024   NEUTROABS 4.2 01/05/2024   HGB 11.3 (L) 01/05/2024   HCT 33.7 (L) 01/05/2024   MCV 86.2 01/05/2024   PLT 164 01/05/2024     STUDIES: NM PET Image Restag (PS) Skull Base To Thigh Result Date: 12/19/2023 EXAM: PET AND CT SKULL BASE TO MID THIGH 12/14/2023 09:14:45 AM TECHNIQUE: PET imaging was acquired from the base of the skull to the mid thighs. Non-contrast enhanced computed tomography was obtained for attenuation correction and anatomic localization. RADIOPHARMACEUTICAL: 11.06 mCi F-18 FDG Uptake time 60 minutes. Glucose level 96 mg/dl. COMPARISON: CT 08/18/2023 CLINICAL HISTORY: Worsening lymphadenopathy on treatment, lymphoma. FINDINGS: HEAD AND NECK: Nasopharyngeal mass centered around  the right tonsillar region is again noted measuring 3.6 x 2.5 cm with SUV max of 20.6, axial image 12. Previously this measured 2.2 x 2.9 cm. Bilateral tracer avid cervical lymph nodes. The dominant lymph node is in the right level 2 region measures 1.8 cm with SUV max of 17.3, axial image 22. On the previous examination this measured 1.2 cm. CHEST: Bilateral tracer avid axillary lymph nodes are identified. Index lymph node within the left axilla measures 5.6 cm with SUV max of 29.9, axial image 54. Previously this measured 3.4 cm. Tracer avid high right paratracheal lymph node measures 1.5 cm with SUV max of 12.4, axial image 36. Previously 0.9 cm. No tracer avid pulmonary nodule or mass. ABDOMEN AND PELVIS: No abnormal tracer uptake identified within the liver. Extensive tracer uptake adenopathy within the abdomen, pelvis, and inguinal regions : -There is diffuse soft tissue stranding about the pancreas. The pancreatic parenchyma appears mildly edematous. Nodal mass within the upper abdomen which appears to involve the body and tail of pancreas measures 8.1 x 5.8 cm and has an SUV max of 20.0, axial image 81. On the previous exam there were 3 distinct lymph nodes in this area which measured up to 1.3  cm. -Index left retroperitoneal node measures 3.6 cm with SUV max of 17.0. This is new compared with the previous exam. -Porta caval lymph node measures 2.1 cm with SUV max of 12.2, axial image 85. On the previous exam this measured 1.4 cm. - 1.2 cm left mesenteric lymph node with SUV max of 6.7, axial image 112. New from the previous exam. -Large tracer avid left external iliac node measures 7.6 cm with SUV max of 20.7, axial image 131. On the previous exam this measured 2.4 cm. -Index node measures 1.6 cm on the left with SUV max of 9.9, axial image 158. Previously this measured 0.7 cm. The spleen measures 16.2 cm in length containing multiple tracer avid lesions. Index lesion within the posterior spleen measures 2.0 cm with SUV max of 19.9, axial image 82. On the prior exam the spleen measured 13.6 cm. The index lesion within the posterior spleen measured 1.3 cm. Bilateral very low attenuation adrenal nodules are compatible with benign adenomas. The largest is on the left measuring 2.8 cm. These are similar to the previous exam. Circumferential wall thickening of the urinary bladder with surrounding haziness. There is a diverticulum of the right side of bladder dome measuring 2.2 cm. Prostate gland is enlarged with mass effect upon the base of bladder. BONES AND SOFT TISSUE: No abnormal FDG activity localizes to the bones. Scattered areas of hazy soft tissue infiltration with mild increased radiotracer uptake is identified throughout the chest and abdominal wall with scattered nodular areas of increased uptake. For example, along the left posterior chest wall there is a focal area of increased uptake with SUV max of 3.3, axial image 39. with SUV max of 10.7, axial image 13. No metabolically active aggressive osseous lesion. IMPRESSION: 1. Worsening lymphadenopathy and splenic involvement, consistent with progression of grade 2 follicular lymphoma. 2. Mildly edematous pancreatic parenchyma with diffuse soft tissue  stranding and nodal mass involving the body and tail of pancreas, measuring 8.1 x 5.8 cm with SUV max 20.0. Findings are concerning for pancreas involvement by lymphoma. Superimposed acute pancreatitis cannot be excluded. 3. Increased radiotracer uptake throughout the chest and abdominal wall subcutaneous soft tissues with nodular areas of mild increased uptake. Cannot exclude cutaneous involvement by lymphoma. 4. Enlarged prostate gland with  mass effect upon the base of bladder. Electronically signed by: Waddell Calk MD 12/19/2023 07:30 AM EDT RP Workstation: [id number]    ONCOLOGY HISTORY: Patient initially received treatment over 15 years ago with R-[facility] in [place] .  He then underwent rituximab  and Zevalin therapy in June 2014.  He did not require any treatment until imaging with CT scan on December 28, 2022 reported continued progression of disease.  Patient also had a mild pancytopenia and increased fatigue.  He agreed to weekly Rituxan  x 4 for control of disease.  Patient completed week 4 of treatment on February 03, 2023.  Repeat imaging on March 18, 2023 reviewed independently with a mixed response to therapy.  Because of this, patient agreed to maintenance Rituxan  every 8 weeks for a total of 2 years.  Repeat CT scan on Aug 18, 2023 reviewed independently with again a mixed response to therapy.  Further imaging with PET scan on November 24, 2023 reviewed independently and report as above with significant progression of disease.  Patient subsequently initiated Rituxan  and Treanda  on December 29, 2023.  ASSESSMENT: Progressive grade 2 follicular lymphoma.  PLAN:    Progressive grade 2 follicular lymphoma: See oncology history as above.  PET scan on November 24, 2023 revealed significant progression of disease.  Proceed with Treanda  and Rituxan  on day 1 with Treanda  on day 2 every 28 days.  Plan to do 4 cycles and then reimage.  Patient completed cycle 1 of treatment last week and tolerated it  well.  Return to clinic in 3 weeks for further evaluation and consideration of cycle 2, day 1.   Axillary lymphadenopathy: Improving.  Systemic treatment as above.  Can consider XRT in the future if necessary.   Anemia: Chronic and unchanged.  Patient's hemoglobin is 11.3.  He has a mildly decreased iron saturation ratio, otherwise his iron panel is within normal limits. Thrombocytopenia: Resolved. Hyponatremia: Mild, monitor. History of reaction to Rituxan : Likely rate based.  Additional premeds have been added for the remainder of his treatments. Hearing loss/fluid: Resolved with tube placement by ENT.   Patient expressed understanding and was in agreement with this plan. He also understands that He can call clinic at any time with any questions, concerns, or complaints.    Evalene JINNY Reusing, MD   01/06/2024 8:50 AM

## 2024-01-06 ENCOUNTER — Encounter: Payer: Self-pay | Admitting: Oncology

## 2024-01-06 LAB — HEPATITIS B CORE ANTIBODY, TOTAL: HEP B CORE AB: NEGATIVE

## 2024-01-07 ENCOUNTER — Other Ambulatory Visit: Payer: Self-pay

## 2024-01-18 ENCOUNTER — Encounter: Payer: Self-pay | Admitting: Oncology

## 2024-01-19 ENCOUNTER — Ambulatory Visit

## 2024-01-19 ENCOUNTER — Ambulatory Visit: Admitting: Oncology

## 2024-01-19 ENCOUNTER — Other Ambulatory Visit

## 2024-01-19 ENCOUNTER — Other Ambulatory Visit: Payer: Self-pay | Admitting: Oncology

## 2024-01-21 ENCOUNTER — Other Ambulatory Visit: Payer: Self-pay

## 2024-01-26 ENCOUNTER — Inpatient Hospital Stay

## 2024-01-26 ENCOUNTER — Inpatient Hospital Stay (HOSPITAL_BASED_OUTPATIENT_CLINIC_OR_DEPARTMENT_OTHER): Admitting: Oncology

## 2024-01-26 ENCOUNTER — Encounter: Payer: Self-pay | Admitting: Oncology

## 2024-01-26 VITALS — BP 140/70 | HR 103 | Temp 97.0°F | Resp 18 | Ht 69.0 in | Wt 192.0 lb

## 2024-01-26 VITALS — BP 121/65 | HR 82 | Temp 96.6°F | Resp 17

## 2024-01-26 DIAGNOSIS — C8218 Follicular lymphoma grade II, lymph nodes of multiple sites: Secondary | ICD-10-CM | POA: Diagnosis not present

## 2024-01-26 DIAGNOSIS — Z5112 Encounter for antineoplastic immunotherapy: Secondary | ICD-10-CM | POA: Diagnosis not present

## 2024-01-26 LAB — CMP (CANCER CENTER ONLY)
ALT: 16 U/L (ref 0–44)
AST: 27 U/L (ref 15–41)
Albumin: 3.7 g/dL (ref 3.5–5.0)
Alkaline Phosphatase: 69 U/L (ref 38–126)
Anion gap: 9 (ref 5–15)
BUN: 13 mg/dL (ref 8–23)
CO2: 23 mmol/L (ref 22–32)
Calcium: 8.5 mg/dL — ABNORMAL LOW (ref 8.9–10.3)
Chloride: 106 mmol/L (ref 98–111)
Creatinine: 0.83 mg/dL (ref 0.61–1.24)
GFR, Estimated: >60 mL/min (ref >60)
Glucose, Bld: 146 mg/dL — ABNORMAL HIGH (ref 70–99)
Potassium: 3.4 mmol/L — ABNORMAL LOW (ref 3.5–5.1)
Sodium: 138 mmol/L (ref 135–145)
Total Bilirubin: 0.6 mg/dL (ref 0.0–1.2)
Total Protein: 5.8 g/dL — ABNORMAL LOW (ref 6.5–8.1)

## 2024-01-26 LAB — CBC WITH DIFFERENTIAL (CANCER CENTER ONLY)
Abs Immature Granulocytes: 0.03 K/uL (ref 0.00–0.07)
Basophils Absolute: 0.1 K/uL (ref 0.0–0.1)
Basophils Relative: 2 %
Eosinophils Absolute: 0.3 K/uL (ref 0.0–0.5)
Eosinophils Relative: 7 %
HCT: 35.2 % — ABNORMAL LOW (ref 39.0–52.0)
Hemoglobin: 11.6 g/dL — ABNORMAL LOW (ref 13.0–17.0)
Immature Granulocytes: 1 %
Lymphocytes Relative: 21 %
Lymphs Abs: 0.8 K/uL (ref 0.7–4.0)
MCH: 28.8 pg (ref 26.0–34.0)
MCHC: 33 g/dL (ref 30.0–36.0)
MCV: 87.3 fL (ref 80.0–100.0)
Monocytes Absolute: 0.6 K/uL (ref 0.1–1.0)
Monocytes Relative: 15 %
Neutro Abs: 2.1 K/uL (ref 1.7–7.7)
Neutrophils Relative %: 54 %
Platelet Count: 86 K/uL — ABNORMAL LOW (ref 150–400)
RBC: 4.03 MIL/uL — ABNORMAL LOW (ref 4.22–5.81)
RDW: 15.5 % (ref 11.5–15.5)
WBC Count: 3.8 K/uL — ABNORMAL LOW (ref 4.0–10.5)
nRBC: 0 % (ref 0.0–0.2)

## 2024-01-26 MED ORDER — SODIUM CHLORIDE 0.9 % IV SOLN
375.0000 mg/m2 | Freq: Once | INTRAVENOUS | Status: AC
  Administered 2024-01-26: 800 mg via INTRAVENOUS
  Filled 2024-01-26: qty 30

## 2024-01-26 MED ORDER — SODIUM CHLORIDE 0.9 % IV SOLN
INTRAVENOUS | Status: DC
  Filled 2024-01-26: qty 250

## 2024-01-26 MED ORDER — FAMOTIDINE IN NACL 20-0.9 MG/50ML-% IV SOLN
20.0000 mg | Freq: Once | INTRAVENOUS | Status: AC
  Administered 2024-01-26: 20 mg via INTRAVENOUS
  Filled 2024-01-26: qty 50

## 2024-01-26 MED ORDER — ACETAMINOPHEN 325 MG PO TABS
650.0000 mg | ORAL_TABLET | Freq: Once | ORAL | Status: AC
  Administered 2024-01-26: 650 mg via ORAL
  Filled 2024-01-26: qty 2

## 2024-01-26 MED ORDER — PALONOSETRON HCL INJECTION 0.25 MG/5ML
0.2500 mg | Freq: Once | INTRAVENOUS | Status: AC
  Administered 2024-01-26: 0.25 mg via INTRAVENOUS
  Filled 2024-01-26: qty 5

## 2024-01-26 MED ORDER — DIPHENHYDRAMINE HCL 25 MG PO CAPS
25.0000 mg | ORAL_CAPSULE | Freq: Once | ORAL | Status: AC
  Administered 2024-01-26: 25 mg via ORAL
  Filled 2024-01-26: qty 1

## 2024-01-26 MED ORDER — SODIUM CHLORIDE 0.9 % IV SOLN
90.0000 mg/m2 | Freq: Once | INTRAVENOUS | Status: AC
  Administered 2024-01-26: 200 mg via INTRAVENOUS
  Filled 2024-01-26: qty 8

## 2024-01-26 MED ORDER — DEXAMETHASONE SOD PHOSPHATE PF 10 MG/ML IJ SOLN
10.0000 mg | Freq: Once | INTRAMUSCULAR | Status: AC
  Administered 2024-01-26: 10 mg via INTRAVENOUS

## 2024-01-26 NOTE — Progress Notes (Unsigned)
 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  INTERVAL HISTORY: Patient returns to clinic today for further evaluation and to assess his toleration of cycle 1 of rituximab  Treanda .  He had some mild nausea and fatigue, but otherwise tolerated his treatment well. 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 He has no other neurologic complaints.  He denies any recent fevers or illnesses.  He has a good appetite and denies weight loss.  He denies any chest pain, shortness of breath, cough, or hemoptysis.  He denies any nausea, vomiting, constipation, or diarrhea.  He has no urinary complaints.  Patient offers no further specific complaints today.  REVIEW OF SYSTEMS:   Review of Systems  Constitutional: Negative.  Negative for diaphoresis, fever, malaise/fatigue and weight loss.  Eyes:  Positive for double vision. Negative for blurred vision and pain.  Respiratory: Negative.  Negative for cough and shortness of breath.   Cardiovascular: Negative.  Negative for chest pain and leg swelling.  Gastrointestinal: Negative.  Negative for abdominal pain.  Genitourinary: Negative.  Negative for dysuria.  Musculoskeletal: Negative.  Negative for neck pain.  Skin: Negative.  Negative for rash.  Neurological: Negative.  Negative for sensory change, focal weakness, weakness and headaches.  Psychiatric/Behavioral: Negative.  The patient is not nervous/anxious.     As per HPI. Otherwise, a complete review of systems is negative.  PAST MEDICAL HISTORY: Past Medical History:  Diagnosis Date  . Follicular lymphoma (HCC) 2009   Patient had chemo + rad tx's and zevaline  shots./ no flare ups since 2015  . GERD (gastroesophageal reflux disease)   . Heart murmur    past finding by Dr Auston  . Hemorrhoids   . Hypercholesterolemia   . Hypertension    started as a preventative  . OSA on CPAP   . Seasonal allergies   . Sleep apnea    CPAP  . Squamous cell carcinoma of skin 02/10/2021   Left hand base of hand - [facility]  . Transfusion history   . Vertigo 5 yrs ago   positional vertigo    PAST SURGICAL HISTORY: Hemorrhoidectomy.  FAMILY HISTORY: Colon cancer and lung cancer.     ADVANCED DIRECTIVES:    HEALTH MAINTENANCE: Social History   Tobacco Use  . Smoking status: Never  . Smokeless tobacco: Never  Vaping Use  . Vaping status: Never Used  Substance Use Topics  . Alcohol use: Yes    Alcohol/week: 2.0 standard drinks of alcohol    Types: 2 Cans of beer per week    Comment: occasional  . Drug use: No     Allergies  Allergen Reactions  . Rituximab -Pvvr Other (See Comments)    Patient had a hypersensitivity to Rituximab . See progress note on 01/13/23. Patient able to complete infusion.  . Tape Rash    bandaids    Current Outpatient Medications  Medication Sig Dispense Refill  . acyclovir  (ZOVIRAX ) 400 MG tablet Take 1 tablet (400 mg total) by mouth daily. 30 tablet 3  . alfuzosin (UROXATRAL) 10 MG 24 hr tablet Take 10 mg by mouth daily.    . allopurinol  (ZYLOPRIM ) 300 MG tablet Take 1 tablet (300 mg total) by mouth daily. 30 tablet  3  . amLODipine (NORVASC) 5 MG tablet Take 5 mg by mouth daily.     SABRA atorvastatin (LIPITOR) 10 MG tablet Take 10 mg by mouth daily at 6 PM.     . Calcium Carbonate-Vitamin D 600-400 MG-UNIT per tablet Take 1 tablet by mouth daily.     . cetirizine (ZYRTEC) 10 MG tablet Take 10 mg by mouth daily.    . ciprofloxacin -dexamethasone  (CIPRODEX ) OTIC suspension Place 4 drops into both ears 2 (two) times daily for 5 days. DOS 10/22/23. 7.5 mL 0  . Fiber Gummies 2 g CHEW Chew by mouth.    SABRA imipramine (TOFRANIL) 25  MG tablet Take 25 mg by mouth at bedtime.    . Multiple Vitamin (MULTI-VITAMINS) TABS Take 1 tablet by mouth daily.     SABRA omeprazole (PRILOSEC) 40 MG capsule Take 40 mg by mouth daily.     . ondansetron  (ZOFRAN ) 8 MG tablet Take 1 tablet (8 mg total) by mouth every 8 (eight) hours as needed for nausea or vomiting. Start on the third day after chemotherapy. 60 tablet 2  . prochlorperazine  (COMPAZINE ) 10 MG tablet Take 1 tablet (10 mg total) by mouth every 6 (six) hours as needed for nausea or vomiting. 60 tablet 2  . tadalafil (CIALIS) 20 MG tablet Take 10 mg by mouth daily as needed.     No current facility-administered medications for this visit.    OBJECTIVE: Vitals:   01/26/24 0901  BP: (!) 140/70  Pulse: (!) 103  Resp: 18  Temp: (!) 97 F (36.1 C)  SpO2: 100%       Body mass index is 28.35 kg/m.    ECOG FS:0 - Asymptomatic  General: Well-developed, well-nourished, no acute distress. Eyes: Pink conjunctiva, anicteric sclera. HEENT: Normocephalic, moist mucous membranes. Lungs: No audible wheezing or coughing. Heart: Regular rate and rhythm. Abdomen: Soft, nontender, no obvious distention. Musculoskeletal: No edema, cyanosis, or clubbing. Neuro: Alert, answering all questions appropriately. Cranial nerves grossly intact. Skin: No rashes or petechiae noted. Psych: Normal affect. Lymphatics: Left axillary lymph node decreased in size.  LAB RESULTS:  Lab Results  Component Value Date   NA 132 (L) 01/05/2024   K 3.9 01/05/2024   CL 100 01/05/2024   CO2 26 01/05/2024   GLUCOSE 103 (H) 01/05/2024   BUN 14 01/05/2024   CREATININE 0.92 01/05/2024   CALCIUM 9.1 01/05/2024   PROT 5.8 (L) 01/05/2024   ALBUMIN 3.6 01/05/2024   AST 24 01/05/2024   ALT 13 01/05/2024   ALKPHOS 70 01/05/2024   BILITOT 0.7 01/05/2024   GFRNONAA >60 01/05/2024   GFRAA >60 12/21/2019    Lab Results  Component Value Date   WBC 3.8 (L) 01/26/2024   NEUTROABS 2.1 01/26/2024   HGB 11.6 (L)  01/26/2024   HCT 35.2 (L) 01/26/2024   MCV 87.3 01/26/2024   PLT 86 (L) 01/26/2024     STUDIES: No results found.   ONCOLOGY HISTORY: Patient initially received treatment over 15 years ago with R-[facility] in [place] .  He then underwent rituximab  and Zevalin therapy in June 2014.  He did not require any treatment until imaging with CT scan on December 28, 2022 reported continued progression of disease.  Patient also had a mild pancytopenia and increased fatigue.  He agreed to weekly Rituxan  x 4 for control of disease.  Patient completed week 4 of treatment on February 03, 2023.  Repeat imaging on March 18, 2023 reviewed independently with a mixed response to therapy.  Because of this, patient agreed to maintenance Rituxan  every 8 weeks for a total of 2 years.  Repeat CT scan on Aug 18, 2023 reviewed independently with again a mixed response to therapy.  Further imaging with PET scan on November 24, 2023 reviewed independently and report as above with significant progression of disease.  Patient subsequently initiated Rituxan  and Treanda  on December 29, 2023.  ASSESSMENT: Progressive grade 2 follicular lymphoma.  PLAN:    Progressive grade 2 follicular lymphoma: See oncology history as above.  PET scan on November 24, 2023 revealed significant progression of disease.  Proceed with Treanda  and Rituxan  on day 1 with Treanda  on day 2 every 28 days.  Plan to do 4 cycles and then reimage.  Patient completed cycle 1 of treatment last week and tolerated it well.  Return to clinic in 3 weeks for further evaluation and consideration of cycle 2, day 1.   Axillary lymphadenopathy: Improving.  Systemic treatment as above.  Can consider XRT in the future if necessary.   Anemia: Chronic and unchanged.  Patient's hemoglobin is 11.3.  He has a mildly decreased iron saturation ratio, otherwise his iron panel is within normal limits. Thrombocytopenia: Resolved. Hyponatremia: Mild, monitor. History of reaction  to Rituxan : Likely rate based.  Additional premeds have been added for the remainder of his treatments. Hearing loss/fluid: Resolved with tube placement by ENT.   Patient expressed understanding and was in agreement with this plan. He also understands that He can call clinic at any time with any questions, concerns, or complaints.    Evalene JINNY Reusing, MD   01/26/2024 9:08 AM

## 2024-01-27 ENCOUNTER — Ambulatory Visit

## 2024-01-27 ENCOUNTER — Inpatient Hospital Stay

## 2024-01-27 ENCOUNTER — Encounter: Payer: Self-pay | Admitting: Oncology

## 2024-01-27 VITALS — BP 105/59 | HR 71 | Temp 97.0°F | Resp 18

## 2024-01-27 DIAGNOSIS — C8218 Follicular lymphoma grade II, lymph nodes of multiple sites: Secondary | ICD-10-CM

## 2024-01-27 DIAGNOSIS — Z5112 Encounter for antineoplastic immunotherapy: Secondary | ICD-10-CM | POA: Diagnosis not present

## 2024-01-27 MED ORDER — SODIUM CHLORIDE 0.9 % IV SOLN
INTRAVENOUS | Status: DC
  Filled 2024-01-27: qty 250

## 2024-01-27 MED ORDER — DEXAMETHASONE SOD PHOSPHATE PF 10 MG/ML IJ SOLN
10.0000 mg | Freq: Once | INTRAMUSCULAR | Status: AC
  Administered 2024-01-27: 10 mg via INTRAVENOUS

## 2024-01-27 MED ORDER — SODIUM CHLORIDE 0.9 % IV SOLN
90.0000 mg/m2 | Freq: Once | INTRAVENOUS | Status: AC
  Administered 2024-01-27: 200 mg via INTRAVENOUS
  Filled 2024-01-27: qty 8

## 2024-01-27 NOTE — Patient Instructions (Signed)
 [facility] - A DEPT OF [facility]  Discharge Instructions: Thank you for choosing [facility] to provide your oncology and hematology care.  If you have a lab appointment with the Cancer Center, please go directly to the Cancer Center and check in at the registration area.  Wear comfortable clothing and clothing appropriate for easy access to any Portacath or PICC line.   We strive to give you quality time with your provider. You may need to reschedule your appointment if you arrive late (15 or more minutes).  Arriving late affects you and other patients whose appointments are after yours.  Also, if you miss three or more appointments without notifying the office, you may be dismissed from the clinic at the provider's discretion.      For prescription refill requests, have your pharmacy contact our office and allow 72 hours for refills to be completed.    Today you received the following chemotherapy and/or immunotherapy agents- [email or web address]      To help prevent nausea and vomiting after your treatment, we encourage you to take your nausea medication as directed.  BELOW ARE SYMPTOMS THAT SHOULD BE REPORTED IMMEDIATELY: *FEVER GREATER THAN 100.4 F (38 C) OR HIGHER *CHILLS OR SWEATING *NAUSEA AND VOMITING THAT IS NOT CONTROLLED WITH YOUR NAUSEA MEDICATION *UNUSUAL SHORTNESS OF BREATH *UNUSUAL BRUISING OR BLEEDING *URINARY PROBLEMS (pain or burning when urinating, or frequent urination) *BOWEL PROBLEMS (unusual diarrhea, constipation, pain near the anus) TENDERNESS IN MOUTH AND THROAT WITH OR WITHOUT PRESENCE OF ULCERS (sore throat, sores in mouth, or a toothache) UNUSUAL RASH, SWELLING OR PAIN  UNUSUAL VAGINAL DISCHARGE OR ITCHING   Items with * indicate a potential emergency and should be followed up as soon as possible or go to the Emergency Department if any problems should occur.  Please show the CHEMOTHERAPY ALERT CARD or IMMUNOTHERAPY  ALERT CARD at check-in to the Emergency Department and triage nurse.  Should you have questions after your visit or need to cancel or reschedule your appointment, please contact [facility] - A DEPT OF [facility]  [phone] and follow the prompts.  Office hours are 8:00 a.m. to 4:30 p.m. Monday - Friday. Please note that voicemails left after 4:00 p.m. may not be returned until the following business day.  We are closed weekends and major holidays. You have access to a nurse at all times for urgent questions. Please call the main number to the clinic [phone] and follow the prompts.  For any non-urgent questions, you may also contact your provider using MyChart. We now offer e-Visits for anyone 35 and older to request care online for non-urgent symptoms. For details visit [email or web address].   Also download the MyChart app! Go to the app store, search "MyChart", open the app, select [place], and log in with your MyChart username and password.

## 2024-02-01 ENCOUNTER — Other Ambulatory Visit: Payer: Self-pay

## 2024-02-09 ENCOUNTER — Ambulatory Visit: Admitting: Dermatology

## 2024-02-09 ENCOUNTER — Inpatient Hospital Stay: Attending: Oncology

## 2024-02-09 ENCOUNTER — Encounter: Payer: Self-pay | Admitting: Dermatology

## 2024-02-09 DIAGNOSIS — C8210 Follicular lymphoma grade II, unspecified site: Secondary | ICD-10-CM | POA: Diagnosis present

## 2024-02-09 DIAGNOSIS — L570 Actinic keratosis: Secondary | ICD-10-CM

## 2024-02-09 DIAGNOSIS — C4492 Squamous cell carcinoma of skin, unspecified: Secondary | ICD-10-CM

## 2024-02-09 DIAGNOSIS — L578 Other skin changes due to chronic exposure to nonionizing radiation: Secondary | ICD-10-CM

## 2024-02-09 DIAGNOSIS — W908XXA Exposure to other nonionizing radiation, initial encounter: Secondary | ICD-10-CM | POA: Diagnosis not present

## 2024-02-09 DIAGNOSIS — C8218 Follicular lymphoma grade II, lymph nodes of multiple sites: Secondary | ICD-10-CM

## 2024-02-09 DIAGNOSIS — C44629 Squamous cell carcinoma of skin of left upper limb, including shoulder: Secondary | ICD-10-CM | POA: Diagnosis not present

## 2024-02-09 DIAGNOSIS — L820 Inflamed seborrheic keratosis: Secondary | ICD-10-CM

## 2024-02-09 DIAGNOSIS — Z79620 Long term (current) use of immunosuppressive biologic: Secondary | ICD-10-CM | POA: Insufficient documentation

## 2024-02-09 DIAGNOSIS — D492 Neoplasm of unspecified behavior of bone, soft tissue, and skin: Secondary | ICD-10-CM

## 2024-02-09 DIAGNOSIS — Z79630 Long term (current) use of alkylating agent: Secondary | ICD-10-CM | POA: Diagnosis not present

## 2024-02-09 DIAGNOSIS — Z5111 Encounter for antineoplastic chemotherapy: Secondary | ICD-10-CM | POA: Diagnosis present

## 2024-02-09 HISTORY — DX: Squamous cell carcinoma of skin, unspecified: C44.92

## 2024-02-09 LAB — CBC WITH DIFFERENTIAL/PLATELET
Abs Immature Granulocytes: 0.01 K/uL (ref 0.00–0.07)
Basophils Absolute: 0 K/uL (ref 0.0–0.1)
Basophils Relative: 2 %
Eosinophils Absolute: 0.2 K/uL (ref 0.0–0.5)
Eosinophils Relative: 6 %
HCT: 34.3 % — ABNORMAL LOW (ref 39.0–52.0)
Hemoglobin: 11.4 g/dL — ABNORMAL LOW (ref 13.0–17.0)
Immature Granulocytes: 0 %
Lymphocytes Relative: 8 %
Lymphs Abs: 0.2 K/uL — ABNORMAL LOW (ref 0.7–4.0)
MCH: 29.5 pg (ref 26.0–34.0)
MCHC: 33.2 g/dL (ref 30.0–36.0)
MCV: 88.9 fL (ref 80.0–100.0)
Monocytes Absolute: 0.6 K/uL (ref 0.1–1.0)
Monocytes Relative: 21 %
Neutro Abs: 1.7 K/uL (ref 1.7–7.7)
Neutrophils Relative %: 63 %
Platelets: 100 K/uL — ABNORMAL LOW (ref 150–400)
RBC: 3.86 MIL/uL — ABNORMAL LOW (ref 4.22–5.81)
RDW: 17 % — ABNORMAL HIGH (ref 11.5–15.5)
WBC: 2.6 K/uL — ABNORMAL LOW (ref 4.0–10.5)
nRBC: 0 % (ref 0.0–0.2)

## 2024-02-09 MED ORDER — MUPIROCIN 2 % EX OINT: TOPICAL_OINTMENT | CUTANEOUS | 1 refills | Status: AC

## 2024-02-09 NOTE — Progress Notes (Signed)
 Follow-Up Visit   Subjective  Sean Raymond is a 78 y.o. male who presents for the following: Spots on hands and arms that are tender. Raised, rough.   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  The patient has spots, moles and lesions to be evaluated, some may be new or changing and the patient may have concern these could be cancer.  The following portions of the chart were reviewed this encounter and updated as appropriate: medications, allergies, medical history  Review of Systems:  No other skin or systemic complaints except as noted in HPI or Assessment and Plan.  Objective  Well appearing patient in no apparent distress; mood and affect are within normal limits.  A focused examination was performed of the following areas: Hands   Relevant physical exam findings are noted in the Assessment and Plan.  Left Hand - Posterior 2.1 cm firm keratotic plaque  arms x17 (17) Erythematous keratotic or waxy stuck-on papule or plaque. face x10 (10) Erythematous thin papules/macules with gritty scale.   Assessment & Plan   NEOPLASM OF SKIN Left Hand - Posterior Epidermal / dermal shaving  Lesion diameter (cm):  2.1 Informed consent: discussed and consent obtained   Timeout: patient name, date of birth, surgical site, and procedure verified   Procedure prep:  Patient was prepped and draped in usual sterile fashion Prep type:  Isopropyl alcohol Anesthesia: the lesion was anesthetized in a standard fashion   Anesthetic:  1% lidocaine  w/ epinephrine  1-100,000 buffered w/ 8.4% NaHCO3 Instrument used: flexible razor blade   Hemostasis achieved with: pressure, aluminum chloride and electrodesiccation   Outcome: patient tolerated procedure well   Post-procedure details: sterile dressing applied and wound care instructions given   Dressing type: bandage and petrolatum    Destruction of lesion Complexity: extensive   Destruction method: electrodesiccation and  curettage   Informed consent: discussed and consent obtained   Timeout:  patient name, date of birth, surgical site, and procedure verified Procedure prep:  Patient was prepped and draped in usual sterile fashion Prep type:  Isopropyl alcohol Anesthesia: the lesion was anesthetized in a standard fashion   Anesthetic:  1% lidocaine  w/ epinephrine  1-100,000 buffered w/ 8.4% NaHCO3 Curettage performed in three different directions: Yes   Electrodesiccation performed over the curetted area: Yes   Curettage cycles:  3 Lesion length (cm):  2.1 Lesion width (cm):  2.1 Margin per side (cm):  0.2 Final wound size (cm):  2.5 Hemostasis achieved with:  pressure and aluminum chloride Outcome: patient tolerated procedure well with no complications   Post-procedure details: sterile dressing applied and wound care instructions given   Dressing type: pressure dressing (mupirocin )    mupirocin  ointment (BACTROBAN ) 2 % Apply once daily with bandage change Specimen 1 - Surgical pathology Differential Diagnosis: R/O SCC  Check Margins: No  EDC today. INFLAMED SEBORRHEIC KERATOSIS (17) arms x17 (17) Symptomatic, irritating, patient would like treated. Destruction of lesion - arms x17 (17) Complexity: simple   Destruction method: cryotherapy   Informed consent: discussed and consent obtained   Timeout:  patient name, date of birth, surgical site, and procedure verified Lesion destroyed using liquid nitrogen: Yes   Region frozen until ice ball extended beyond lesion: Yes   Outcome: patient tolerated procedure well with no complications   Post-procedure details: wound care instructions given   Additional details:  Prior to procedure, discussed risks of blister formation, small wound, skin dyspigmentation, or rare scar following cryotherapy. Recommend Vaseline ointment to treated areas  while healing.   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 They are chronic with expected duration over 1 year. A portion of actinic keratoses will progress to squamous cell carcinoma of the skin. It is not possible to reliably predict which spots will progress to skin cancer and so treatment is recommended to prevent development of skin cancer.  Recommend daily broad spectrum sunscreen SPF 30+ to sun-exposed areas, reapply every 2 hours as needed.  Recommend staying in the shade or wearing long sleeves, sun glasses (UVA+UVB protection) and wide brim hats (4-inch brim around the entire circumference of the hat). Call for new or changing lesions. Destruction of lesion - face x10 (10) Complexity: simple   Destruction method: cryotherapy   Informed consent: discussed and consent obtained   Timeout:  patient name, date of birth, surgical site, and procedure verified Lesion destroyed using liquid nitrogen: Yes   Region frozen until ice ball extended beyond lesion: Yes   Outcome: patient tolerated procedure well with no complications   Post-procedure details: wound care instructions given   Additional details:  Prior to procedure, discussed risks of blister formation, small wound, skin dyspigmentation, or rare scar following cryotherapy. Recommend Vaseline ointment to treated areas while healing.   SCC (SQUAMOUS CELL CARCINOMA), HAND, LEFT   ACTINIC SKIN DAMAGE    ACTINIC DAMAGE - chronic, secondary to cumulative UV radiation exposure/sun exposure over time - diffuse scaly erythematous macules with underlying dyspigmentation - Recommend daily broad spectrum sunscreen SPF 30+ to sun-exposed areas, reapply every 2 hours as needed.  - Recommend staying in the shade or wearing long sleeves, sun glasses (UVA+UVB protection) and wide brim hats (4-inch brim around the entire circumference of the hat). - Call for new or changing lesions.  Return for TBSE As Scheduled, With Dr.  Hester.  I, Jill Parcell, CMA, am acting as scribe for Alm Hester, MD.   Documentation: I have reviewed the above documentation for accuracy and completeness, and I agree with the above.  Alm Hester, MD

## 2024-02-09 NOTE — Patient Instructions (Addendum)
 Cryotherapy Aftercare  Wash gently with soap and water  everyday.   Apply Vaseline and Band-Aid daily until healed.    Ruling out squamous cell carcinoma    Beginning Saturday: Wound Care Instructions  Cleanse wound gently with soap and water  once a day then pat dry with clean gauze. Apply a thin coat of Mupirocin  over the wound.  Do not pick or remove scabs. Do not remove the yellow or white healing tissue from the base of the wound.  Cover the wound with fresh, clean, nonstick gauze and secure with paper tape. You may use Band-Aids in place of gauze and tape if the wound is small enough, but would recommend trimming much of the tape off as there is often too much. Sometimes Band-Aids can irritate the skin.  You should call the office for your biopsy report after 1 week if you have not already been contacted.  If you experience any problems, such as abnormal amounts of bleeding, swelling, significant bruising, significant pain, or evidence of infection, please call the office immediately.  FOR ADULT SURGERY PATIENTS: If you need something for pain relief you may take 1 extra strength Tylenol  (acetaminophen ) AND 2 Ibuprofen (200mg  each) together every 4 hours as needed for pain. (do not take these if you are allergic to them or if you have a reason you should not take them.) Typically, you may only need pain medication for 1 to 3 days.        Recommend daily broad spectrum sunscreen SPF 30+ to sun-exposed areas, reapply every 2 hours as needed. Call for new or changing lesions.  Staying in the shade or wearing long sleeves, sun glasses (UVA+UVB protection) and wide brim hats (4-inch brim around the entire circumference of the hat) are also recommended for sun protection.      Due to recent changes in healthcare laws, you may see results of your pathology and/or laboratory studies on MyChart before the doctors have had a chance to review them. We understand that in some cases there  may be results that are confusing or concerning to you. Please understand that not all results are received at the same time and often the doctors may need to interpret multiple results in order to provide you with the best plan of care or course of treatment. Therefore, we ask that you please give us  2 business days to thoroughly review all your results before contacting the office for clarification. Should we see a critical lab result, you will be contacted sooner.   If You Need Anything After Your Visit  If you have any questions or concerns for your doctor, please call our main line at [phone] and press option 4 to reach your doctor's medical assistant. If no one answers, please leave a voicemail as directed and we will return your call as soon as possible. Messages left after 4 pm will be answered the following business day.   You may also send us  a message via MyChart. We typically respond to MyChart messages within 1-2 business days.  For prescription refills, please ask your pharmacy to contact our office. Our fax number is [phone].  If you have an urgent issue when the clinic is closed that cannot wait until the next business day, you can page your doctor at the number below.    Please note that while we do our best to be available for urgent issues outside of office hours, we are not available 24/7.   If you have an urgent  issue and are unable to reach us , you may choose to seek medical care at your doctor's office, retail clinic, urgent care center, or emergency room.  If you have a medical emergency, please immediately call 911 or go to the emergency department.  Pager Numbers  - Dr. Hester: [phone]  - Dr. Jackquline: [phone]  - Dr. Claudene: [phone]   - Dr. Raymund: [phone]  In the event of inclement weather, please call our main line at [phone] for an update on the status of any delays or closures.  Dermatology Medication Tips: Please keep  the boxes that topical medications come in in order to help keep track of the instructions about where and how to use these. Pharmacies typically print the medication instructions only on the boxes and not directly on the medication tubes.   If your medication is too expensive, please contact our office at [phone] option 4 or send us  a message through MyChart.   We are unable to tell what your co-pay for medications will be in advance as this is different depending on your insurance coverage. However, we may be able to find a substitute medication at lower cost or fill out paperwork to get insurance to cover a needed medication.   If a prior authorization is required to get your medication covered by your insurance company, please allow us  1-2 business days to complete this process.  Drug prices often vary depending on where the prescription is filled and some pharmacies may offer cheaper prices.  The website www.goodrx.com contains coupons for medications through different pharmacies. The prices here do not account for what the cost may be with help from insurance (it may be cheaper with your insurance), but the website can give you the price if you did not use any insurance.  - You can print the associated coupon and take it with your prescription to the pharmacy.  - You may also stop by our office during regular business hours and pick up a GoodRx coupon card.  - If you need your prescription sent electronically to a different pharmacy, notify our office through [facility] or by phone at [phone] option 4.     Si Usted Necesita Algo Despus de Su Visita  Tambin puede enviarnos un mensaje a travs de Clinical Cytogeneticist. Por lo general respondemos a los mensajes de MyChart en el transcurso de 1 a 2 das hbiles.  Para renovar recetas, por favor pida a su farmacia que se ponga en contacto con nuestra oficina. Randi lakes de fax es [place] [phone].  Si tiene un asunto urgente cuando  la clnica est cerrada y que no puede esperar hasta el siguiente da hbil, puede llamar/localizar a su doctor(a) al nmero que aparece a continuacin.   Por favor, tenga en cuenta que aunque hacemos todo lo posible para estar disponibles para asuntos urgentes fuera del horario de [place], no estamos disponibles las 24 horas del da, los 7 [address]  [phone] de la [place].   Si tiene un problema urgente y no puede comunicarse con nosotros, puede optar por buscar atencin mdica  en el consultorio de su doctor(a), en una clnica privada, en un centro de atencin urgente o en una sala de emergencias.  Si tiene engineer, drilling, por favor llame inmediatamente al 911 o vaya a la sala de emergencias.  Nmeros de bper  - Dr. Hester: [phone]  - Dra. Jackquline: [phone]  - Dr. Claudene: [phone]  - Dra. Kitts: [phone]  En caso de inclemencias del [place], por  favor llame a nuestra lnea principal al [phone] para una actualizacin sobre el [place] de cualquier retraso o cierre.  Consejos para la medicacin en dermatologa: Por favor, guarde las cajas en las que vienen los medicamentos de uso tpico para ayudarle a seguir las instrucciones sobre dnde y cmo usarlos. Las farmacias generalmente imprimen las instrucciones del medicamento slo en las cajas y no directamente en los tubos del [place].   Si su medicamento es muy caro, por favor, pngase en contacto con landry rieger llamando al [phone] y presione la opcin 4 o envenos un mensaje a travs de Clinical Cytogeneticist.   No podemos decirle cul ser su copago por los medicamentos por adelantado ya que esto es diferente dependiendo de la cobertura de su seguro. Sin embargo, es posible que podamos encontrar un medicamento sustituto a audiological scientist un formulario para que el seguro cubra el medicamento que se considera necesario.   Si se requiere una autorizacin previa para que su compaa de seguros cubra su medicamento, por favor  permtanos de 1 a 2 das hbiles para completar este proceso.  Los precios de los medicamentos varan con frecuencia dependiendo del environmental consultant de dnde se surte la receta y alguna farmacias pueden ofrecer precios ms baratos.  El sitio web www.goodrx.com tiene cupones para medicamentos de health and safety inspector. Los precios aqu no tienen en cuenta lo que podra costar con la ayuda del seguro (puede ser ms barato con su seguro), pero el sitio web puede darle el precio si no utiliz tourist information centre manager.  - Puede imprimir el cupn correspondiente y llevarlo con su receta a la farmacia.  - Tambin puede pasar por nuestra oficina durante el horario de atencin regular y education officer, museum una tarjeta de cupones de GoodRx.  - Si necesita que su receta se enve electrnicamente a una farmacia diferente, informe a nuestra oficina a travs de MyChart de [place] o por telfono llamando al [phone] y presione la opcin 4.

## 2024-02-14 ENCOUNTER — Ambulatory Visit: Payer: Self-pay | Admitting: Dermatology

## 2024-02-14 LAB — SURGICAL PATHOLOGY

## 2024-02-15 ENCOUNTER — Encounter: Payer: Self-pay | Admitting: Dermatology

## 2024-02-15 NOTE — Telephone Encounter (Addendum)
 Called and discussed bx results with patient. He verbalized understanding and denied further questions. Will recheck at next follow up.  ----- Message from Alm Rhyme sent at 02/14/2024  5:51 PM EST ----- FINAL DIAGNOSIS        1. Skin, left hand - posterior :       WELL DIFFERENTIATED SQUAMOUS CELL CARCINOMA    Cancer = SCC Already treated Recheck next visit ----- Message ----- From: Interface, Lab In Three Zero One Sent: 02/14/2024   4:56 PM EST To: Alm JAYSON Rhyme, MD

## 2024-02-18 ENCOUNTER — Other Ambulatory Visit: Payer: Self-pay

## 2024-02-20 ENCOUNTER — Other Ambulatory Visit: Payer: Self-pay

## 2024-02-22 ENCOUNTER — Inpatient Hospital Stay

## 2024-02-22 ENCOUNTER — Inpatient Hospital Stay (HOSPITAL_BASED_OUTPATIENT_CLINIC_OR_DEPARTMENT_OTHER): Admitting: Oncology

## 2024-02-22 ENCOUNTER — Encounter: Payer: Self-pay | Admitting: Oncology

## 2024-02-22 VITALS — BP 134/75 | HR 87 | Temp 97.1°F | Resp 18

## 2024-02-22 VITALS — BP 140/82 | HR 87 | Temp 96.9°F | Resp 18 | Ht 69.0 in | Wt 195.0 lb

## 2024-02-22 DIAGNOSIS — C8218 Follicular lymphoma grade II, lymph nodes of multiple sites: Secondary | ICD-10-CM

## 2024-02-22 DIAGNOSIS — Z5111 Encounter for antineoplastic chemotherapy: Secondary | ICD-10-CM | POA: Diagnosis not present

## 2024-02-22 LAB — CBC WITH DIFFERENTIAL (CANCER CENTER ONLY)
Abs Immature Granulocytes: 0.01 K/uL (ref 0.00–0.07)
Basophils Absolute: 0 K/uL (ref 0.0–0.1)
Basophils Relative: 2 %
Eosinophils Absolute: 0.1 K/uL (ref 0.0–0.5)
Eosinophils Relative: 4 %
HCT: 35.7 % — ABNORMAL LOW (ref 39.0–52.0)
Hemoglobin: 12 g/dL — ABNORMAL LOW (ref 13.0–17.0)
Immature Granulocytes: 0 %
Lymphocytes Relative: 14 %
Lymphs Abs: 0.4 K/uL — ABNORMAL LOW (ref 0.7–4.0)
MCH: 30 pg (ref 26.0–34.0)
MCHC: 33.6 g/dL (ref 30.0–36.0)
MCV: 89.3 fL (ref 80.0–100.0)
Monocytes Absolute: 0.6 K/uL (ref 0.1–1.0)
Monocytes Relative: 21 %
Neutro Abs: 1.6 K/uL — ABNORMAL LOW (ref 1.7–7.7)
Neutrophils Relative %: 59 %
Platelet Count: 114 K/uL — ABNORMAL LOW (ref 150–400)
RBC: 4 MIL/uL — ABNORMAL LOW (ref 4.22–5.81)
RDW: 16.6 % — ABNORMAL HIGH (ref 11.5–15.5)
WBC Count: 2.7 K/uL — ABNORMAL LOW (ref 4.0–10.5)
nRBC: 0 % (ref 0.0–0.2)

## 2024-02-22 LAB — CMP (CANCER CENTER ONLY)
ALT: 16 U/L (ref 0–44)
AST: 24 U/L (ref 15–41)
Albumin: 3.9 g/dL (ref 3.5–5.0)
Alkaline Phosphatase: 87 U/L (ref 38–126)
Anion gap: 9 (ref 5–15)
BUN: 13 mg/dL (ref 8–23)
CO2: 24 mmol/L (ref 22–32)
Calcium: 8.6 mg/dL — ABNORMAL LOW (ref 8.9–10.3)
Chloride: 105 mmol/L (ref 98–111)
Creatinine: 0.85 mg/dL (ref 0.61–1.24)
GFR, Estimated: >60 mL/min (ref >60)
Glucose, Bld: 167 mg/dL — ABNORMAL HIGH (ref 70–99)
Potassium: 3.7 mmol/L (ref 3.5–5.1)
Sodium: 138 mmol/L (ref 135–145)
Total Bilirubin: 0.6 mg/dL (ref 0.0–1.2)
Total Protein: 6.1 g/dL — ABNORMAL LOW (ref 6.5–8.1)

## 2024-02-22 MED ORDER — DIPHENHYDRAMINE HCL 25 MG PO CAPS
25.0000 mg | ORAL_CAPSULE | Freq: Once | ORAL | Status: AC
  Administered 2024-02-22: 25 mg via ORAL
  Filled 2024-02-22: qty 1

## 2024-02-22 MED ORDER — ACETAMINOPHEN 325 MG PO TABS
650.0000 mg | ORAL_TABLET | Freq: Once | ORAL | Status: AC
  Administered 2024-02-22: 650 mg via ORAL
  Filled 2024-02-22: qty 2

## 2024-02-22 MED ORDER — SODIUM CHLORIDE 0.9 % IV SOLN
90.0000 mg/m2 | Freq: Once | INTRAVENOUS | Status: AC
  Administered 2024-02-22: 200 mg via INTRAVENOUS
  Filled 2024-02-22: qty 8

## 2024-02-22 MED ORDER — SODIUM CHLORIDE 0.9 % IV SOLN
INTRAVENOUS | Status: DC
  Filled 2024-02-22: qty 250

## 2024-02-22 MED ORDER — FAMOTIDINE IN NACL 20-0.9 MG/50ML-% IV SOLN
20.0000 mg | Freq: Once | INTRAVENOUS | Status: AC
  Administered 2024-02-22: 20 mg via INTRAVENOUS
  Filled 2024-02-22: qty 50

## 2024-02-22 MED ORDER — SODIUM CHLORIDE 0.9 % IV SOLN
375.0000 mg/m2 | Freq: Once | INTRAVENOUS | Status: AC
  Administered 2024-02-22: 800 mg via INTRAVENOUS
  Filled 2024-02-22: qty 30

## 2024-02-22 MED ORDER — DEXAMETHASONE SOD PHOSPHATE PF 10 MG/ML IJ SOLN
10.0000 mg | Freq: Once | INTRAMUSCULAR | Status: AC
  Administered 2024-02-22: 10 mg via INTRAVENOUS

## 2024-02-22 MED ORDER — PALONOSETRON HCL INJECTION 0.25 MG/5ML
0.2500 mg | Freq: Once | INTRAVENOUS | Status: AC
  Administered 2024-02-22: 0.25 mg via INTRAVENOUS
  Filled 2024-02-22: qty 5

## 2024-02-22 NOTE — Patient Instructions (Signed)

## 2024-02-22 NOTE — Progress Notes (Signed)
 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  INTERVAL HISTORY: Patient returns to clinic today for further evaluation and consideration of cycle 3, day 1 of Rituxan  and Treanda .  He continues to have fatigue, but otherwise is tolerating his treatments well.  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 He has no other neurologic complaints.  He denies any recent fevers or illnesses.  He has a good appetite and denies weight loss.  He denies any chest pain, shortness of breath, cough, or hemoptysis.  He denies any nausea, vomiting, constipation, or diarrhea.  He has no urinary complaints.  Patient offers no further specific complaints today.  REVIEW OF SYSTEMS:   Review of Systems  Constitutional:  Positive for malaise/fatigue. Negative for diaphoresis, fever and weight loss.  Eyes:  Positive for double vision. Negative for blurred vision and pain.  Respiratory: Negative.  Negative for cough and shortness of breath.   Cardiovascular: Negative.  Negative for chest pain and leg swelling.  Gastrointestinal: Negative.  Negative for abdominal pain.  Genitourinary: Negative.  Negative for dysuria.  Musculoskeletal: Negative.  Negative for neck pain.  Skin: Negative.  Negative for rash.  Neurological: Negative.  Negative for sensory change, focal weakness, weakness and headaches.  Psychiatric/Behavioral: Negative.  The patient is not nervous/anxious.     As per HPI. Otherwise, a complete review of systems is negative.  PAST MEDICAL HISTORY: Past Medical History:  Diagnosis Date   Follicular lymphoma (HCC) 2009   Patient had chemo + rad tx's and zevaline  shots./ no flare ups since 2015   GERD (gastroesophageal reflux disease)    Heart murmur    past finding by Dr Auston   Hemorrhoids    Hypercholesterolemia    Hypertension    started as a preventative   OSA on CPAP    SCC (squamous cell carcinoma) 02/09/2024   left hand posterior - trx ED&C will recheck at followup   Seasonal allergies    Sleep apnea    CPAP   Squamous cell carcinoma of skin 02/10/2021   Left hand base of hand - EDC   Transfusion history    Vertigo 5 yrs ago   positional vertigo    PAST SURGICAL HISTORY: Hemorrhoidectomy.  FAMILY HISTORY: Colon cancer and lung cancer.     ADVANCED DIRECTIVES:    HEALTH MAINTENANCE: Social History   Tobacco Use   Smoking status: Never   Smokeless tobacco: Never  Vaping Use   Vaping status: Never Used  Substance Use Topics   Alcohol use: Yes    Alcohol/week: 2.0 standard drinks of alcohol    Types: 2 Cans of beer per week    Comment: occasional   Drug use: No     Allergies  Allergen Reactions   Rituximab -Pvvr Other (See Comments)    Patient had a hypersensitivity to Rituximab . See progress note on 01/13/23. Patient able to complete infusion.   Tape Rash    bandaids    Current Outpatient Medications  Medication Sig Dispense Refill   acyclovir  (ZOVIRAX ) 400 MG tablet Take 1 tablet (400 mg total) by mouth daily. 30 tablet 3   alfuzosin (UROXATRAL) 10 MG 24 hr tablet Take 10 mg by mouth daily.  allopurinol  (ZYLOPRIM ) 300 MG tablet Take 1 tablet (300 mg total) by mouth daily. 30 tablet 3   amLODipine (NORVASC) 5 MG tablet Take 5 mg by mouth daily.      atorvastatin (LIPITOR) 10 MG tablet Take 10 mg by mouth daily at 6 PM.      Calcium Carbonate-Vitamin D 600-400 MG-UNIT per tablet Take 1 tablet by mouth daily.      cetirizine (ZYRTEC) 10 MG tablet Take 10 mg by mouth daily.     ciprofloxacin -dexamethasone  (CIPRODEX ) OTIC suspension Place 4 drops into both ears 2 (two) times daily for 5 days. DOS 10/22/23. 7.5  mL 0   Fiber Gummies 2 g CHEW Chew by mouth.     imipramine (TOFRANIL) 25 MG tablet Take 25 mg by mouth at bedtime.     Multiple Vitamin (MULTI-VITAMINS) TABS Take 1 tablet by mouth daily.      mupirocin  ointment (BACTROBAN ) 2 % Apply once daily with bandage change 22 g 1   omeprazole (PRILOSEC) 40 MG capsule Take 40 mg by mouth daily.      ondansetron  (ZOFRAN ) 8 MG tablet Take 1 tablet (8 mg total) by mouth every 8 (eight) hours as needed for nausea or vomiting. Start on the third day after chemotherapy. 60 tablet 2   prochlorperazine  (COMPAZINE ) 10 MG tablet Take 1 tablet (10 mg total) by mouth every 6 (six) hours as needed for nausea or vomiting. 60 tablet 2   tadalafil (CIALIS) 20 MG tablet Take 10 mg by mouth daily as needed.     No current facility-administered medications for this visit.   Facility-Administered Medications Ordered in Other Visits  Medication Dose Route Frequency Provider Last Rate Last Admin   0.9 %  sodium chloride  infusion   Intravenous Continuous Jacobo Evalene PARAS, MD   Stopped at 02/22/24 1451    OBJECTIVE: Vitals:   02/22/24 0845  BP: (!) 140/82  Pulse: 87  Resp: 18  Temp: (!) 96.9 F (36.1 C)  SpO2: 100%       Body mass index is 28.8 kg/m.    ECOG FS:0 - Asymptomatic  General: Well-developed, well-nourished, no acute distress. Eyes: Pink conjunctiva, anicteric sclera. HEENT: Normocephalic, moist mucous membranes. Lungs: No audible wheezing or coughing. Heart: Regular rate and rhythm. Abdomen: Soft, nontender, no obvious distention. Musculoskeletal: No edema, cyanosis, or clubbing. Neuro: Alert, answering all questions appropriately. Cranial nerves grossly intact. Skin: No rashes or petechiae noted. Psych: Normal affect. Lymphatics: Left axillary lymph node minimally palpable.  LAB RESULTS:  Lab Results  Component Value Date   NA 138 02/22/2024   K 3.7 02/22/2024   CL 105 02/22/2024   CO2 24 02/22/2024   GLUCOSE 167 (H) 02/22/2024    BUN 13 02/22/2024   CREATININE 0.85 02/22/2024   CALCIUM 8.6 (L) 02/22/2024   PROT 6.1 (L) 02/22/2024   ALBUMIN 3.9 02/22/2024   AST 24 02/22/2024   ALT 16 02/22/2024   ALKPHOS 87 02/22/2024   BILITOT 0.6 02/22/2024   GFRNONAA >60 02/22/2024   GFRAA >60 12/21/2019    Lab Results  Component Value Date   WBC 2.7 (L) 02/22/2024   NEUTROABS 1.6 (L) 02/22/2024   HGB 12.0 (L) 02/22/2024   HCT 35.7 (L) 02/22/2024   MCV 89.3 02/22/2024   PLT 114 (L) 02/22/2024     STUDIES: No results found.   ONCOLOGY HISTORY: Patient initially received treatment over 15 years ago with R-CHOP in [place] .  He then underwent rituximab  and Zevalin therapy in  June 2014.  He did not require any treatment until imaging with CT scan on December 28, 2022 reported continued progression of disease.  Patient also had a mild pancytopenia and increased fatigue.  He agreed to weekly Rituxan  x 4 for control of disease.  Patient completed week 4 of treatment on February 03, 2023.  Repeat imaging on March 18, 2023 reviewed independently with a mixed response to therapy.  Because of this, patient agreed to maintenance Rituxan  every 8 weeks for a total of 2 years.  Repeat CT scan on Aug 18, 2023 reviewed independently with again a mixed response to therapy.  Further imaging with PET scan on November 24, 2023 reviewed independently and report as above with significant progression of disease.  Patient subsequently initiated Rituxan  and Treanda  on December 29, 2023.  ASSESSMENT: Progressive grade 2 follicular lymphoma.  PLAN:    Progressive grade 2 follicular lymphoma: See oncology history as above.  PET scan on November 24, 2023 revealed significant progression of disease.  Proceed with Treanda  and Rituxan  on day 1 with Treanda  on day 2 every 28 days.  Plan to do 4 cycles and then reimage.  Proceed with cycle 3, day 1 of treatment today.  Return to clinic tomorrow for Treanda  only.  Patient will then return to clinic in  4 weeks for further evaluation and consideration of cycle 4, day 1.  Axillary lymphadenopathy: Significantly improved.  Systemic treatment as above.  Can consider XRT in the future if necessary.   Leukopenia: Chronic and unchanged.  Patient's total white blood cell count is 2.7.  Proceed with treatment as above. Anemia: Chronic and unchanged.  Patient's hemoglobin is 12.0.  Previously patient was noted to have a mildly decreased iron saturation ratio, otherwise his iron panel is within normal limits. Thrombocytopenia: Platelet count improved to 114.  Proceed with treatment as above. Hypokalemia: Resolved. History of reaction to Rituxan : Likely rate based.  Additional premeds have been added for the remainder of his treatments. Hearing loss/fluid: Resolved with tube placement by ENT.   Patient expressed understanding and was in agreement with this plan. He also understands that He can call clinic at any time with any questions, concerns, or complaints.    Evalene JINNY Reusing, MD   02/22/2024 3:01 PM

## 2024-02-23 ENCOUNTER — Ambulatory Visit

## 2024-02-23 ENCOUNTER — Other Ambulatory Visit

## 2024-02-23 ENCOUNTER — Inpatient Hospital Stay

## 2024-02-23 ENCOUNTER — Ambulatory Visit: Admitting: Oncology

## 2024-02-23 VITALS — BP 134/59 | HR 76 | Temp 97.2°F | Resp 18

## 2024-02-23 DIAGNOSIS — Z5111 Encounter for antineoplastic chemotherapy: Secondary | ICD-10-CM | POA: Diagnosis not present

## 2024-02-23 DIAGNOSIS — C8218 Follicular lymphoma grade II, lymph nodes of multiple sites: Secondary | ICD-10-CM

## 2024-02-23 MED ORDER — DEXAMETHASONE SOD PHOSPHATE PF 10 MG/ML IJ SOLN
10.0000 mg | Freq: Once | INTRAMUSCULAR | Status: AC
  Administered 2024-02-23: 10 mg via INTRAVENOUS

## 2024-02-23 MED ORDER — SODIUM CHLORIDE 0.9 % IV SOLN
90.0000 mg/m2 | Freq: Once | INTRAVENOUS | Status: AC
  Administered 2024-02-23: 200 mg via INTRAVENOUS
  Filled 2024-02-23: qty 8

## 2024-02-23 MED ORDER — SODIUM CHLORIDE 0.9 % IV SOLN
INTRAVENOUS | Status: DC
  Filled 2024-02-23: qty 250

## 2024-02-23 NOTE — Patient Instructions (Signed)
 [facility] - A DEPT OF [facility]  Discharge Instructions: Thank you for choosing [facility] to provide your oncology and hematology care.  If you have a lab appointment with the Cancer Center, please go directly to the Cancer Center and check in at the registration area.  Wear comfortable clothing and clothing appropriate for easy access to any Portacath or PICC line.   We strive to give you quality time with your provider. You may need to reschedule your appointment if you arrive late (15 or more minutes).  Arriving late affects you and other patients whose appointments are after yours.  Also, if you miss three or more appointments without notifying the office, you may be dismissed from the clinic at the provider's discretion.      For prescription refill requests, have your pharmacy contact our office and allow 72 hours for refills to be completed.    Today you received the following chemotherapy and/or immunotherapy agents BENDEKA      To help prevent nausea and vomiting after your treatment, we encourage you to take your nausea medication as directed.  BELOW ARE SYMPTOMS THAT SHOULD BE REPORTED IMMEDIATELY: *FEVER GREATER THAN 100.4 F (38 C) OR HIGHER *CHILLS OR SWEATING *NAUSEA AND VOMITING THAT IS NOT CONTROLLED WITH YOUR NAUSEA MEDICATION *UNUSUAL SHORTNESS OF BREATH *UNUSUAL BRUISING OR BLEEDING *URINARY PROBLEMS (pain or burning when urinating, or frequent urination) *BOWEL PROBLEMS (unusual diarrhea, constipation, pain near the anus) TENDERNESS IN MOUTH AND THROAT WITH OR WITHOUT PRESENCE OF ULCERS (sore throat, sores in mouth, or a toothache) UNUSUAL RASH, SWELLING OR PAIN  UNUSUAL VAGINAL DISCHARGE OR ITCHING   Items with * indicate a potential emergency and should be followed up as soon as possible or go to the Emergency Department if any problems should occur.  Please show the CHEMOTHERAPY ALERT CARD or IMMUNOTHERAPY  ALERT CARD at check-in to the Emergency Department and triage nurse.  Should you have questions after your visit or need to cancel or reschedule your appointment, please contact [facility] - A DEPT OF [facility]  [phone] and follow the prompts.  Office hours are 8:00 a.m. to 4:30 p.m. Monday - Friday. Please note that voicemails left after 4:00 p.m. may not be returned until the following business day.  We are closed weekends and major holidays. You have access to a nurse at all times for urgent questions. Please call the main number to the clinic [phone] and follow the prompts.  For any non-urgent questions, you may also contact your provider using MyChart. We now offer e-Visits for anyone 43 and older to request care online for non-urgent symptoms. For details visit [email or web address].   Also download the MyChart app! Go to the app store, search "MyChart", open the app, select [facility], and log in with your MyChart username and password.  Bendamustine Injection What is this medication? BENDAMUSTINE (BEN da MUS teen) treats leukemia and lymphoma. It works by slowing down the growth of cancer cells. This medicine may be used for other purposes; ask your health care provider or pharmacist if you have questions. COMMON BRAND NAME(S): Marilynne Halsted, VIVIMUSTA What should I tell my care team before I take this medication? They need to know if you have any of these conditions: Infection, especially a viral infection, such as chickenpox, cold sores, herpes Kidney disease Liver disease An unusual or allergic reaction to bendamustine, mannitol, other  medications, foods, dyes, or preservatives Pregnant or trying to get pregnant Breast-feeding How should I use this medication? This medication is injected into a vein. It is given by your care team in a hospital or clinic setting. Talk to your care team about the use of this medication in  children. Special care may be needed. Overdosage: If you think you have taken too much of this medicine contact a poison control center or emergency room at once. NOTE: This medicine is only for you. Do not share this medicine with others. What if I miss a dose? Keep appointments for follow-up doses. It is important not to miss your dose. Call your care team if you are unable to keep an appointment. What may interact with this medication? Do not take this medication with any of the following: Clozapine This medication may also interact with the following: Atazanavir Cimetidine Ciprofloxacin Enoxacin Fluvoxamine Medications for seizures, such as carbamazepine, phenobarbital Mexiletine Rifampin Tacrine Thiabendazole Zileuton This list may not describe all possible interactions. Give your health care provider a list of all the medicines, herbs, non-prescription drugs, or dietary supplements you use. Also tell them if you smoke, drink alcohol, or use illegal drugs. Some items may interact with your medicine. What should I watch for while using this medication? Visit your care team for regular checks on your progress. This medication may make you feel generally unwell. This is not uncommon, as chemotherapy can affect healthy cells as well as cancer cells. Report any side effects. Continue your course of treatment even though you feel ill unless your care team tells you to stop. You may need blood work while taking this medication. This medication may increase your risk of getting an infection. Call your care team for advice if you get a fever, chills, sore throat, or other symptoms of a cold or flu. Do not treat yourself. Try to avoid being around people who are sick. This medication may cause serious skin reactions. They can happen weeks to months after starting the medication. Contact your care team right away if you notice fevers or flu-like symptoms with a rash. The rash may be red or purple  and then turn into blisters or peeling of the skin. You may also notice a red rash with swelling of the face, lips, or lymph nodes in your neck or under your arms. In some patients, this medication may cause a serious brain infection that may cause death. If you have any problems seeing, thinking, speaking, walking, or standing, tell your care team right away. If you cannot reach your care team, urgently seek other source of medical care. This medication may increase your risk to bruise or bleed. Call your care team if you notice any unusual bleeding. Talk to your care team about your risk of cancer. You may be more at risk for certain types of cancer if you take this medication. Talk to your care team about your risk of skin cancer. You may be more at risk for skin cancer if you take this medication. Talk to your care team if you or your partner wish to become pregnant or think either of you might be pregnant. This medication can cause serious birth defects if taken during pregnancy or for up to 6 months after the last dose. A negative pregnancy test is required before starting this medication. A reliable form of contraception is recommended while taking this medication and for 6 months after the last dose. Talk to your care team about reliable  forms of contraception. Wear a condom while taking this medication and for at least 3 months after the last dose. Do not breast-feed while taking this medication or for at least 1 week after the last dose. This medication may cause infertility. Talk to your care team if you are concerned about your fertility. What side effects may I notice from receiving this medication? Side effects that you should report to your care team as soon as possible: Allergic reactions--skin rash, itching, hives, swelling of the face, lips, tongue, or throat Infection--fever, chills, cough, sore throat, wounds that don't heal, pain or trouble when passing urine, general feeling of  discomfort or being unwell Infusion reactions--chest pain, shortness of breath or trouble breathing, feeling faint or lightheaded Liver injury--right upper belly pain, loss of appetite, nausea, light-colored stool, dark yellow or brown urine, yellowing skin or eyes, unusual weakness or fatigue Low red blood cell level--unusual weakness or fatigue, dizziness, headache, trouble breathing Painful swelling, warmth, or redness of the skin, blisters or sores at the infusion site Rash, fever, and swollen lymph nodes Redness, blistering, peeling, or loosening of the skin, including inside the mouth Tumor lysis syndrome (TLS)--nausea, vomiting, diarrhea, decrease in the amount of urine, dark urine, unusual weakness or fatigue, confusion, muscle pain or cramps, fast or irregular heartbeat, joint pain Unusual bruising or bleeding Side effects that usually do not require medical attention (report to your care team if they continue or are bothersome): Diarrhea Fatigue Headache Loss of appetite Nausea Vomiting This list may not describe all possible side effects. Call your doctor for medical advice about side effects. You may report side effects to FDA at 1-800-FDA-1088. Where should I keep my medication? This medication is given in a hospital or clinic. It will not be stored at home. NOTE: This sheet is a summary. It may not cover all possible information. If you have questions about this medicine, talk to your doctor, pharmacist, or health care provider.  2024 [employer]/Gold Standard (2021-07-15 00:00:00)

## 2024-03-15 ENCOUNTER — Ambulatory Visit

## 2024-03-15 ENCOUNTER — Other Ambulatory Visit

## 2024-03-15 ENCOUNTER — Ambulatory Visit: Admitting: Oncology

## 2024-03-16 ENCOUNTER — Ambulatory Visit: Payer: Medicare Other | Admitting: Dermatology

## 2024-03-16 ENCOUNTER — Ambulatory Visit: Admitting: Dermatology

## 2024-03-16 ENCOUNTER — Encounter: Payer: Self-pay | Admitting: Dermatology

## 2024-03-16 DIAGNOSIS — L821 Other seborrheic keratosis: Secondary | ICD-10-CM

## 2024-03-16 DIAGNOSIS — L578 Other skin changes due to chronic exposure to nonionizing radiation: Secondary | ICD-10-CM

## 2024-03-16 DIAGNOSIS — L820 Inflamed seborrheic keratosis: Secondary | ICD-10-CM | POA: Diagnosis not present

## 2024-03-16 DIAGNOSIS — L570 Actinic keratosis: Secondary | ICD-10-CM | POA: Diagnosis not present

## 2024-03-16 DIAGNOSIS — C44622 Squamous cell carcinoma of skin of right upper limb, including shoulder: Secondary | ICD-10-CM

## 2024-03-16 DIAGNOSIS — D489 Neoplasm of uncertain behavior, unspecified: Secondary | ICD-10-CM

## 2024-03-16 DIAGNOSIS — D692 Other nonthrombocytopenic purpura: Secondary | ICD-10-CM

## 2024-03-16 DIAGNOSIS — Z8589 Personal history of malignant neoplasm of other organs and systems: Secondary | ICD-10-CM

## 2024-03-16 DIAGNOSIS — Z1283 Encounter for screening for malignant neoplasm of skin: Secondary | ICD-10-CM

## 2024-03-16 NOTE — Patient Instructions (Addendum)
 Actinic keratoses are precancerous spots that appear secondary to cumulative UV radiation exposure/sun exposure over time. They are chronic with expected duration over 1 year. A portion of actinic keratoses will progress to squamous cell carcinoma of the skin. It is not possible to reliably predict which spots will progress to skin cancer and so treatment is recommended to prevent development of skin cancer.  Recommend daily broad spectrum sunscreen SPF 30+ to sun-exposed areas, reapply every 2 hours as needed.  Recommend staying in the shade or wearing long sleeves, sun glasses (UVA+UVB protection) and wide brim hats (4-inch brim around the entire circumference of the hat). Call for new or changing lesions.   Cryotherapy Aftercare  Wash gently with soap and water  everyday.   Apply Vaseline and Band-Aid daily until healed.   Seborrheic Keratosis  What causes seborrheic keratoses? Seborrheic keratoses are harmless, common skin growths that first appear during adult life.  As time goes by, more growths appear.  Some people may develop a large number of them.  Seborrheic keratoses appear on both covered and uncovered body parts.  They are not caused by sunlight.  The tendency to develop seborrheic keratoses can be inherited.  They vary in color from skin-colored to gray, brown, or even black.  They can be either smooth or have a rough, warty surface.   Seborrheic keratoses are superficial and look as if they were stuck on the skin.  Under the microscope this type of keratosis looks like layers upon layers of skin.  That is why at times the top layer may seem to fall off, but the rest of the growth remains and re-grows.    Treatment Seborrheic keratoses do not need to be treated, but can easily be removed in the office.  Seborrheic keratoses often cause symptoms when they rub on clothing or jewelry.  Lesions can be in the way of shaving.  If they become inflamed, they can cause itching, soreness, or  burning.  Removal of a seborrheic keratosis can be accomplished by freezing, burning, or surgery. If any spot bleeds, scabs, or grows rapidly, please return to have it checked, as these can be an indication of a skin cancer.     Biopsy Wound Care Instructions  Leave the original bandage on for 24 hours if possible.  If the bandage becomes soaked or soiled before that time, it is OK to remove it and examine the wound.  A small amount of post-operative bleeding is normal.  If excessive bleeding occurs, remove the bandage, place gauze over the site and apply continuous pressure (no peeking) over the area for 30 minutes. If this does not work, please call our clinic as soon as possible or page your doctor if it is after hours.   Once a day, cleanse the wound with soap and water . It is fine to shower. If a thick crust develops you may use a Q-tip dipped into dilute hydrogen peroxide (mix 1:1 with water ) to dissolve it.  Hydrogen peroxide can slow the healing process, so use it only as needed.    After washing, apply petroleum jelly (Vaseline) or an antibiotic ointment if your doctor prescribed one for you, followed by a bandage.    For best healing, the wound should be covered with a layer of ointment at all times. If you are not able to keep the area covered with a bandage to hold the ointment in place, this may mean re-applying the ointment several times a day.  Continue this wound  care until the wound has healed and is no longer open.   Itching and mild discomfort is normal during the healing process. However, if you develop pain or severe itching, please call our office.   If you have any discomfort, you can take Tylenol  (acetaminophen ) or ibuprofen as directed on the bottle. (Please do not take these if you have an allergy to them or cannot take them for another reason).  Some redness, tenderness and white or yellow material in the wound is normal healing.  If the area becomes very sore and red,  or develops a thick yellow-green material (pus), it may be infected; please notify us .    If you have stitches, return to clinic as directed to have the stitches removed. You will continue wound care for 2-3 days after the stitches are removed.   Wound healing continues for up to one year following surgery. It is not unusual to experience pain in the scar from time to time during the interval.  If the pain becomes severe or the scar thickens, you should notify the office.    A slight amount of redness in a scar is expected for the first six months.  After six months, the redness will fade and the scar will soften and fade.  The color difference becomes less noticeable with time.  If there are any problems, return for a post-op surgery check at your earliest convenience.  To improve the appearance of the scar, you can use silicone scar gel, cream, or sheets (such as Mederma or Serica) every night for up to one year. These are available over the counter (without a prescription).  Please call our office at [phone] for any questions or concerns.     Electrodesiccation and Curettage (Scrape and Burn) Wound Care Instructions  Leave the original bandage on for 24 hours if possible.  If the bandage becomes soaked or soiled before that time, it is OK to remove it and examine the wound.  A small amount of post-operative bleeding is normal.  If excessive bleeding occurs, remove the bandage, place gauze over the site and apply continuous pressure (no peeking) over the area for 30 minutes. If this does not work, please call our clinic as soon as possible or page your doctor if it is after hours.   Once a day, cleanse the wound with soap and water . It is fine to shower. If a thick crust develops you may use a Q-tip dipped into dilute hydrogen peroxide (mix 1:1 with water ) to dissolve it.  Hydrogen peroxide can slow the healing process, so use it only as needed.    After washing, apply petroleum jelly  (Vaseline) or an antibiotic ointment if your doctor prescribed one for you, followed by a bandage.    For best healing, the wound should be covered with a layer of ointment at all times. If you are not able to keep the area covered with a bandage to hold the ointment in place, this may mean re-applying the ointment several times a day.  Continue this wound care until the wound has healed and is no longer open. It may take several weeks for the wound to heal and close.  Itching and mild discomfort is normal during the healing process.  If you have any discomfort, you can take Tylenol  (acetaminophen ) or ibuprofen as directed on the bottle. (Please do not take these if you have an allergy to them or cannot take them for another reason).  Some redness, tenderness  and white or yellow material in the wound is normal healing.  If the area becomes very sore and red, or develops a thick yellow-green material (pus), it may be infected; please notify us .    Wound healing continues for up to one year following surgery. It is not unusual to experience pain in the scar from time to time during the interval.  If the pain becomes severe or the scar thickens, you should notify the office.    A slight amount of redness in a scar is expected for the first six months.  After six months, the redness will fade and the scar will soften and fade.  The color difference becomes less noticeable with time.  If there are any problems, return for a post-op surgery check at your earliest convenience.  To improve the appearance of the scar, you can use silicone scar gel, cream, or sheets (such as Mederma or Serica) every night for up to one year. These are available over the counter (without a prescription).  Please call our office at [phone] for any questions or concerns.  Melanoma ABCDEs  Melanoma is the most dangerous type of skin cancer, and is the leading cause of death from skin disease.  You are more likely to  develop melanoma if you: Have light-colored skin, light-colored eyes, or red or blond hair Spend a lot of time in the sun Tan regularly, either outdoors or in a tanning bed Have had blistering sunburns, especially during childhood Have a close family member who has had a melanoma Have atypical moles or large birthmarks  Early detection of melanoma is key since treatment is typically straightforward and cure rates are extremely high if we catch it early.   The first sign of melanoma is often a change in a mole or a new dark spot.  The ABCDE system is a way of remembering the signs of melanoma.  A for asymmetry:  The two halves do not match. B for border:  The edges of the growth are irregular. C for color:  A mixture of colors are present instead of an even brown color. D for diameter:  Melanomas are usually (but not always) greater than 6mm - the size of a pencil eraser. E for evolution:  The spot keeps changing in size, shape, and color.  Please check your skin once per month between visits. You can use a small mirror in front and a large mirror behind you to keep an eye on the back side or your body.   If you see any new or changing lesions before your next follow-up, please call to schedule a visit.  Please continue daily skin protection including broad spectrum sunscreen SPF 30+ to sun-exposed areas, reapplying every 2 hours as needed when you're outdoors.   Staying in the shade or wearing long sleeves, sun glasses (UVA+UVB protection) and wide brim hats (4-inch brim around the entire circumference of the hat) are also recommended for sun protection.    Due to recent changes in healthcare laws, you may see results of your pathology and/or laboratory studies on MyChart before the doctors have had a chance to review them. We understand that in some cases there may be results that are confusing or concerning to you. Please understand that not all results are received at the same time and  often the doctors may need to interpret multiple results in order to provide you with the best plan of care or course of treatment. Therefore, we ask that  you please give us  2 business days to thoroughly review all your results before contacting the office for clarification. Should we see a critical lab result, you will be contacted sooner.   If You Need Anything After Your Visit  If you have any questions or concerns for your doctor, please call our main line at [phone] and press option 4 to reach your doctor's medical assistant. If no one answers, please leave a voicemail as directed and we will return your call as soon as possible. Messages left after 4 pm will be answered the following business day.   You may also send us  a message via MyChart. We typically respond to MyChart messages within 1-2 business days.  For prescription refills, please ask your pharmacy to contact our office. Our fax number is [phone].  If you have an urgent issue when the clinic is closed that cannot wait until the next business day, you can page your doctor at the number below.    Please note that while we do our best to be available for urgent issues outside of office hours, we are not available 24/7.   If you have an urgent issue and are unable to reach us , you may choose to seek medical care at your doctor's office, retail clinic, urgent care center, or emergency room.  If you have a medical emergency, please immediately call 911 or go to the emergency department.  Pager Numbers  - Dr. Hester: [phone]  - Dr. Jackquline: [phone]  - Dr. Claudene: [phone]   - Dr. Raymund: [phone]  In the event of inclement weather, please call our main line at [phone] for an update on the status of any delays or closures.  Dermatology Medication Tips: Please keep the boxes that topical medications come in in order to help keep track of the instructions about where and how to use these.  Pharmacies typically print the medication instructions only on the boxes and not directly on the medication tubes.   If your medication is too expensive, please contact our office at [phone] option 4 or send us  a message through MyChart.   We are unable to tell what your co-pay for medications will be in advance as this is different depending on your insurance coverage. However, we may be able to find a substitute medication at lower cost or fill out paperwork to get insurance to cover a needed medication.   If a prior authorization is required to get your medication covered by your insurance company, please allow us  1-2 business days to complete this process.  Drug prices often vary depending on where the prescription is filled and some pharmacies may offer cheaper prices.  The website www.goodrx.com contains coupons for medications through different pharmacies. The prices here do not account for what the cost may be with help from insurance (it may be cheaper with your insurance), but the website can give you the price if you did not use any insurance.  - You can print the associated coupon and take it with your prescription to the pharmacy.  - You may also stop by our office during regular business hours and pick up a GoodRx coupon card.  - If you need your prescription sent electronically to a different pharmacy, notify our office through [facility] or by phone at [phone] option 4.     Si Usted Necesita Algo Despus de Su Visita  Tambin puede enviarnos un mensaje a travs de Clinical Cytogeneticist. Por lo general respondemos a los mensajes  de MyChart en el transcurso de 1 a 2 das hbiles.  Para renovar recetas, por favor pida a su farmacia que se ponga en contacto con nuestra oficina. Randi lakes de fax es [place] [phone].  Si tiene un asunto urgente cuando la clnica est cerrada y que no puede esperar hasta el siguiente da hbil, puede llamar/localizar a su doctor(a) al nmero  que aparece a continuacin.   Por favor, tenga en cuenta que aunque hacemos todo lo posible para estar disponibles para asuntos urgentes fuera del horario de [place], no estamos disponibles las 24 horas del da, los 7 [address]  [address] de la [place].   Si tiene un problema urgente y no puede comunicarse con nosotros, puede optar por buscar atencin mdica  en el consultorio de su doctor(a), en una clnica privada, en un centro de atencin urgente o en una sala de emergencias.  Si tiene engineer, drilling, por favor llame inmediatamente al 911 o vaya a la sala de emergencias.  Nmeros de bper  - Dr. Hester: [phone]  - Dra. Jackquline: [phone]  - Dr. Claudene: [phone]  - Dra. Kitts: [phone]  En caso de inclemencias del [place], por favor llame a nuestra lnea principal al [phone] para una actualizacin sobre el estado de cualquier retraso o cierre.  Consejos para la medicacin en dermatologa: Por favor, guarde las cajas en las que vienen los medicamentos de uso tpico para ayudarle a seguir las instrucciones sobre dnde y cmo usarlos. Las farmacias generalmente imprimen las instrucciones del medicamento slo en las cajas y no directamente en los tubos del [place].   Si su medicamento es muy caro, por favor, pngase en contacto con landry rieger llamando al [phone] y presione la opcin 4 o envenos un mensaje a travs de Clinical Cytogeneticist.   No podemos decirle cul ser su copago por los medicamentos por adelantado ya que esto es diferente dependiendo de la cobertura de su seguro. Sin embargo, es posible que podamos encontrar un medicamento sustituto a audiological scientist un formulario para que el seguro cubra el medicamento que se considera necesario.   Si se requiere una autorizacin previa para que su compaa de seguros cubra su medicamento, por favor permtanos de 1 a 2 das hbiles para completar este proceso.  Los precios de los medicamentos varan con frecuencia  dependiendo del environmental consultant de dnde se surte la receta y alguna farmacias pueden ofrecer precios ms baratos.  El sitio web www.goodrx.com tiene cupones para medicamentos de health and safety inspector. Los precios aqu no tienen en cuenta lo que podra costar con la ayuda del seguro (puede ser ms barato con su seguro), pero el sitio web puede darle el precio si no utiliz tourist information centre manager.  - Puede imprimir el cupn correspondiente y llevarlo con su receta a la farmacia.  - Tambin puede pasar por nuestra oficina durante el horario de atencin regular y education officer, museum una tarjeta de cupones de GoodRx.  - Si necesita que su receta se enve electrnicamente a una farmacia diferente, informe a nuestra oficina a travs de MyChart de [place] o por telfono llamando al [phone] y presione la opcin 4.

## 2024-03-16 NOTE — Progress Notes (Signed)
 Follow-Up Visit   Subjective  Sean Raymond is a 78 y.o. male who presents for the following: Skin Cancer Screening and Full Body Skin Exam Hx of scc  Hx of aks and isks   Spot at left hand Spot at right arm that was treated sensitive   The patient presents for Total-Body Skin Exam (TBSE) for skin cancer screening and mole check. The patient has spots, moles and lesions to be evaluated, some may be new or changing and the patient may have concern these could be cancer.  The following portions of the chart were reviewed this encounter and updated as appropriate: medications, allergies, medical history  Review of Systems:  No other skin or systemic complaints except as noted in HPI or Assessment and Plan.  Objective  Well appearing patient in no apparent distress; mood and affect are within normal limits.  A full examination was performed including scalp, head, eyes, ears, nose, lips, neck, chest, axillae, abdomen, back, buttocks, bilateral upper extremities, bilateral lower extremities, hands, feet, fingers, toes, fingernails, and toenails. All findings within normal limits unless otherwise noted below.   Relevant physical exam findings are noted in the Assessment and Plan. face and ears x 17 (17) Erythematous thin papules/macules with gritty scale.  left upper back 0.4 cm irregular brown macule   right proximal dorsal forearm 1.5 cm pink papule   left scapula x 1 Erythematous stuck-on, waxy papule or plaque  Assessment & Plan   HISTORY OF SQUAMOUS CELL CARCINOMA OF THE SKIN.  02/09/2024 left hand posterior treated with ED&C rechecked today photos - will recheck at next follow up 02/10/2021 left hand base of hand  - ED&C  - No evidence of recurrence today - No lymphadenopathy - Recommend regular full body skin exams - Recommend daily broad spectrum sunscreen SPF 30+ to sun-exposed areas, reapply every 2 hours as needed.  - Call if any new or changing lesions are  noted between office visits   SKIN CANCER SCREENING PERFORMED TODAY.  ACTINIC DAMAGE - Chronic condition, secondary to cumulative UV/sun exposure - diffuse scaly erythematous macules with underlying dyspigmentation - Recommend daily broad spectrum sunscreen SPF 30+ to sun-exposed areas, reapply every 2 hours as needed.  - Staying in the shade or wearing long sleeves, sun glasses (UVA+UVB protection) and wide brim hats (4-inch brim around the entire circumference of the hat) are also recommended for sun protection.  - Call for new or changing lesions.  LENTIGINES, SEBORRHEIC KERATOSES, HEMANGIOMAS - Benign normal skin lesions - Benign-appearing - Call for any changes  MELANOCYTIC NEVI - Tan-brown and/or pink-flesh-colored symmetric macules and papules - Benign appearing on exam today - Observation - Call clinic for new or changing moles - Recommend daily use of broad spectrum spf 30+ sunscreen to sun-exposed areas.   Purpura - Chronic; persistent and recurrent.  Treatable, but not curable. - Violaceous macules and patches - Benign - Related to trauma, age, sun damage and/or use of blood thinners, chronic use of topical and/or oral steroids - Observe - Can use OTC arnica containing moisturizer such as Dermend Bruise Formula if desired - Call for worsening or other concerns ACTINIC KERATOSIS (17) face and ears x 17 (17) Actinic keratoses are precancerous spots that appear secondary to cumulative UV radiation exposure/sun exposure over time. They are chronic with expected duration over 1 year. A portion of actinic keratoses will progress to squamous cell carcinoma of the skin. It is not possible to reliably predict which spots will progress to  skin cancer and so treatment is recommended to prevent development of skin cancer.  Recommend daily broad spectrum sunscreen SPF 30+ to sun-exposed areas, reapply every 2 hours as needed.  Recommend staying in the shade or wearing long sleeves,  sun glasses (UVA+UVB protection) and wide brim hats (4-inch brim around the entire circumference of the hat). Call for new or changing lesions. - Destruction of lesion - face and ears x 17 (17) Complexity: simple   Destruction method: cryotherapy   Informed consent: discussed and consent obtained   Timeout:  patient name, date of birth, surgical site, and procedure verified Lesion destroyed using liquid nitrogen: Yes   Region frozen until ice ball extended beyond lesion: Yes   Outcome: patient tolerated procedure well with no complications   Post-procedure details: wound care instructions given    NEOPLASM OF UNCERTAIN BEHAVIOR (2) left upper back - Epidermal / dermal shaving  Lesion diameter (cm):  0.4 Informed consent: discussed and consent obtained   Timeout: patient name, date of birth, surgical site, and procedure verified   Procedure prep:  Patient was prepped and draped in usual sterile fashion Prep type:  Isopropyl alcohol Anesthesia: the lesion was anesthetized in a standard fashion   Anesthetic:  1% lidocaine  w/ epinephrine  1-100,000 buffered w/ 8.4% NaHCO3 Instrument used: flexible razor blade   Hemostasis achieved with: pressure, aluminum chloride and electrodesiccation   Outcome: patient tolerated procedure well   Post-procedure details: sterile dressing applied and wound care instructions given   Dressing type: bandage and petrolatum    Specimen 1 - Surgical pathology Differential Diagnosis: irregular nevus r/o dysplasia   Check Margins: yes right proximal dorsal forearm - Epidermal / dermal shaving  Lesion diameter (cm):  1.5 Informed consent: discussed and consent obtained   Timeout: patient name, date of birth, surgical site, and procedure verified   Procedure prep:  Patient was prepped and draped in usual sterile fashion Prep type:  Isopropyl alcohol Anesthesia: the lesion was anesthetized in a standard fashion   Anesthetic:  1% lidocaine  w/ epinephrine   1-100,000 buffered w/ 8.4% NaHCO3 Instrument used: flexible razor blade   Hemostasis achieved with: pressure, aluminum chloride and electrodesiccation   Outcome: patient tolerated procedure well   Post-procedure details: sterile dressing applied and wound care instructions given   Dressing type: bandage and petrolatum    - Destruction of lesion Complexity: extensive   Destruction method: electrodesiccation and curettage   Informed consent: discussed and consent obtained   Timeout:  patient name, date of birth, surgical site, and procedure verified Procedure prep:  Patient was prepped and draped in usual sterile fashion Prep type:  Isopropyl alcohol Anesthesia: the lesion was anesthetized in a standard fashion   Anesthetic:  1% lidocaine  w/ epinephrine  1-100,000 buffered w/ 8.4% NaHCO3 Curettage performed in three different directions: Yes   Electrodesiccation performed over the curetted area: Yes   Lesion length (cm):  1.5 Lesion width (cm):  1.5 Margin per side (cm):  0.2 Final wound size (cm):  1.9 Hemostasis achieved with:  pressure, aluminum chloride and electrodesiccation Outcome: patient tolerated procedure well with no complications   Post-procedure details: sterile dressing applied and wound care instructions given   Dressing type: bandage and petrolatum    Specimen 2 - Surgical pathology Differential Diagnosis: r/o scc  ED&C today  Check Margins: No Irregular nevus r/o dysplasia   R/o scc  Shv removal edc  INFLAMED SEBORRHEIC KERATOSIS left scapula x 1 Symptomatic, irritating, patient would like treated. - Destruction of lesion -  left scapula x 1 Complexity: simple   Destruction method: cryotherapy   Informed consent: discussed and consent obtained   Timeout:  patient name, date of birth, surgical site, and procedure verified Lesion destroyed using liquid nitrogen: Yes   Region frozen until ice ball extended beyond lesion: Yes   Outcome: patient tolerated  procedure well with no complications   Post-procedure details: wound care instructions given    SKIN CANCER SCREENING   HISTORY OF SQUAMOUS CELL CARCINOMA   ACTINIC SKIN DAMAGE   SEBORRHEIC KERATOSIS   PURPURA   Return for 2 - 3 month follow up on scc at left hand and ak isk follow up, 6 month tbse .  IEleanor Blush, CMA, am acting as scribe for Alm Rhyme, MD.   Documentation: I have reviewed the above documentation for accuracy and completeness, and I agree with the above.  Alm Rhyme, MD

## 2024-03-17 ENCOUNTER — Other Ambulatory Visit: Payer: Self-pay

## 2024-03-20 ENCOUNTER — Ambulatory Visit: Payer: Self-pay | Admitting: Dermatology

## 2024-03-20 LAB — SURGICAL PATHOLOGY

## 2024-03-21 ENCOUNTER — Encounter: Payer: Self-pay | Admitting: Dermatology

## 2024-03-21 NOTE — Telephone Encounter (Addendum)
 Called and discussed results with patient and verbalized understanding and denied further questions. Will recheck at next follow up   ----- Message from Alm Rhyme, MD sent at 03/20/2024  6:07 PM EST ----- FINAL DIAGNOSIS        1. Skin, left upper back :       RETICULATED PIGMENTED SEBORRHEIC KERATOSIS, INFLAMED        2. Skin, right proximal dorsal forearm :       WELL DIFFERENTIATED SQUAMOUS CELL CARCINOMA   1- Benign inflamed keratosis No further treatment needed 2- Cancer = SCC Well differentiated Already treated Recheck next visit

## 2024-03-22 ENCOUNTER — Encounter: Payer: Self-pay | Admitting: Oncology

## 2024-03-22 ENCOUNTER — Inpatient Hospital Stay: Attending: Oncology

## 2024-03-22 ENCOUNTER — Ambulatory Visit

## 2024-03-22 ENCOUNTER — Ambulatory Visit: Attending: Oncology | Admitting: Oncology

## 2024-03-22 VITALS — BP 132/77 | HR 77 | Temp 97.3°F | Resp 18 | Ht 69.0 in | Wt 197.3 lb

## 2024-03-22 DIAGNOSIS — C8210 Follicular lymphoma grade II, unspecified site: Secondary | ICD-10-CM | POA: Insufficient documentation

## 2024-03-22 DIAGNOSIS — Z79620 Long term (current) use of immunosuppressive biologic: Secondary | ICD-10-CM | POA: Diagnosis not present

## 2024-03-22 DIAGNOSIS — Z5111 Encounter for antineoplastic chemotherapy: Secondary | ICD-10-CM | POA: Insufficient documentation

## 2024-03-22 DIAGNOSIS — Z79630 Long term (current) use of alkylating agent: Secondary | ICD-10-CM | POA: Insufficient documentation

## 2024-03-22 DIAGNOSIS — C8218 Follicular lymphoma grade II, lymph nodes of multiple sites: Secondary | ICD-10-CM | POA: Diagnosis not present

## 2024-03-22 LAB — CMP (CANCER CENTER ONLY)
ALT: 18 U/L (ref 0–44)
AST: 26 U/L (ref 15–41)
Albumin: 4.2 g/dL (ref 3.5–5.0)
Alkaline Phosphatase: 89 U/L (ref 38–126)
Anion gap: 11 (ref 5–15)
BUN: 10 mg/dL (ref 8–23)
CO2: 25 mmol/L (ref 22–32)
Calcium: 9.1 mg/dL (ref 8.9–10.3)
Chloride: 105 mmol/L (ref 98–111)
Creatinine: 0.93 mg/dL (ref 0.61–1.24)
GFR, Estimated: >60 mL/min (ref >60)
Glucose, Bld: 108 mg/dL — ABNORMAL HIGH (ref 70–99)
Potassium: 3.6 mmol/L (ref 3.5–5.1)
Sodium: 141 mmol/L (ref 135–145)
Total Bilirubin: 0.4 mg/dL (ref 0.0–1.2)
Total Protein: 5.8 g/dL — ABNORMAL LOW (ref 6.5–8.1)

## 2024-03-22 LAB — CBC WITH DIFFERENTIAL (CANCER CENTER ONLY)
Abs Immature Granulocytes: 0.02 K/uL (ref 0.00–0.07)
Basophils Absolute: 0 K/uL (ref 0.0–0.1)
Basophils Relative: 1 %
Eosinophils Absolute: 0.1 K/uL (ref 0.0–0.5)
Eosinophils Relative: 4 %
HCT: 35.3 % — ABNORMAL LOW (ref 39.0–52.0)
Hemoglobin: 12.1 g/dL — ABNORMAL LOW (ref 13.0–17.0)
Immature Granulocytes: 1 %
Lymphocytes Relative: 10 %
Lymphs Abs: 0.4 K/uL — ABNORMAL LOW (ref 0.7–4.0)
MCH: 30.9 pg (ref 26.0–34.0)
MCHC: 34.3 g/dL (ref 30.0–36.0)
MCV: 90.3 fL (ref 80.0–100.0)
Monocytes Absolute: 0.7 K/uL (ref 0.1–1.0)
Monocytes Relative: 20 %
Neutro Abs: 2.3 K/uL (ref 1.7–7.7)
Neutrophils Relative %: 64 %
Platelet Count: 106 K/uL — ABNORMAL LOW (ref 150–400)
RBC: 3.91 MIL/uL — ABNORMAL LOW (ref 4.22–5.81)
RDW: 15.6 % — ABNORMAL HIGH (ref 11.5–15.5)
WBC Count: 3.6 K/uL — ABNORMAL LOW (ref 4.0–10.5)
nRBC: 0 % (ref 0.0–0.2)

## 2024-03-22 MED ORDER — PALONOSETRON HCL INJECTION 0.25 MG/5ML
0.2500 mg | Freq: Once | INTRAVENOUS | Status: AC
  Administered 2024-03-22: 10:00:00 0.25 mg via INTRAVENOUS
  Filled 2024-03-22: qty 5

## 2024-03-22 MED ORDER — SODIUM CHLORIDE 0.9 % IV SOLN
90.0000 mg/m2 | Freq: Once | INTRAVENOUS | Status: AC
  Administered 2024-03-22: 14:00:00 200 mg via INTRAVENOUS
  Filled 2024-03-22: qty 8

## 2024-03-22 MED ORDER — SODIUM CHLORIDE 0.9 % IV SOLN
INTRAVENOUS | Status: DC
  Filled 2024-03-22: qty 250

## 2024-03-22 MED ORDER — SODIUM CHLORIDE 0.9 % IV SOLN
375.0000 mg/m2 | Freq: Once | INTRAVENOUS | Status: AC
  Administered 2024-03-22: 11:00:00 800 mg via INTRAVENOUS
  Filled 2024-03-22: qty 50

## 2024-03-22 MED ORDER — ACETAMINOPHEN 325 MG PO TABS
650.0000 mg | ORAL_TABLET | Freq: Once | ORAL | Status: AC
  Administered 2024-03-22: 10:00:00 650 mg via ORAL
  Filled 2024-03-22: qty 2

## 2024-03-22 MED ORDER — DIPHENHYDRAMINE HCL 25 MG PO TABS
25.0000 mg | ORAL_TABLET | Freq: Once | ORAL | Status: AC
  Administered 2024-03-22: 10:00:00 25 mg via ORAL
  Filled 2024-03-22: qty 1

## 2024-03-22 MED ORDER — DEXAMETHASONE SOD PHOSPHATE PF 10 MG/ML IJ SOLN
10.0000 mg | Freq: Once | INTRAMUSCULAR | Status: AC
  Administered 2024-03-22: 10:00:00 10 mg via INTRAVENOUS

## 2024-03-22 MED ORDER — FAMOTIDINE IN NACL 20-0.9 MG/50ML-% IV SOLN
20.0000 mg | Freq: Once | INTRAVENOUS | Status: AC
  Administered 2024-03-22: 10:00:00 20 mg via INTRAVENOUS
  Filled 2024-03-22: qty 50

## 2024-03-22 NOTE — Progress Notes (Signed)
 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  INTERVAL HISTORY: Patient returns to clinic today for further evaluation and consideration of cycle 4, day 1 of Rituxan  and Treanda .  He has chronic fatigue, but otherwise feels well.  He is tolerating treatments without significant side effects. 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 He has no other neurologic complaints.  He denies any recent fevers or illnesses.  He has a good appetite and denies weight loss.  He denies any chest pain, shortness of breath, cough, or hemoptysis.  He denies any nausea, vomiting, constipation, or diarrhea.  He has no urinary complaints.  Patient offers no further specific complaints today.  REVIEW OF SYSTEMS:   Review of Systems  Constitutional:  Positive for malaise/fatigue. Negative for diaphoresis, fever and weight loss.  Eyes:  Positive for double vision. Negative for blurred vision and pain.  Respiratory: Negative.  Negative for cough and shortness of breath.   Cardiovascular: Negative.  Negative for chest pain and leg swelling.  Gastrointestinal: Negative.  Negative for abdominal pain.  Genitourinary: Negative.  Negative for dysuria.  Musculoskeletal: Negative.  Negative for neck pain.  Skin: Negative.  Negative for rash.  Neurological: Negative.  Negative for sensory change, focal weakness, weakness and headaches.  Psychiatric/Behavioral: Negative.  The patient is not nervous/anxious.     As per HPI. Otherwise, a complete review of systems is negative.  PAST MEDICAL HISTORY: Past Medical History:  Diagnosis Date   Follicular lymphoma (HCC) 2009   Patient had  chemo + rad tx's and zevaline shots./ no flare ups since 2015   GERD (gastroesophageal reflux disease)    Heart murmur    past finding by Dr Auston   Hemorrhoids    Hypercholesterolemia    Hypertension    started as a preventative   OSA on CPAP    SCC (squamous cell carcinoma) 02/09/2024   left hand posterior - trx ED&C will recheck at followup   SCC (squamous cell carcinoma) 03/16/2024   right proximal dorsal forearm - ED&C done.   Seasonal allergies    Sleep apnea    CPAP   Squamous cell carcinoma of skin 02/10/2021   Left hand base of hand - EDC   Transfusion history    Vertigo 5 yrs ago   positional vertigo    PAST SURGICAL HISTORY: Hemorrhoidectomy.  FAMILY HISTORY: Colon cancer and lung cancer.     ADVANCED DIRECTIVES:    HEALTH MAINTENANCE: Social History   Tobacco Use   Smoking status: Never   Smokeless tobacco: Never  Vaping Use   Vaping status: Never Used  Substance Use Topics   Alcohol use: Yes    Alcohol/week: 2.0 standard drinks of alcohol    Types: 2 Cans of beer per week    Comment: occasional   Drug use: No     Allergies  Allergen Reactions   Rituximab -Pvvr Other (See Comments)    Patient had a hypersensitivity to Rituximab . See progress note on 01/13/23. Patient able to complete infusion.   Tape Rash    bandaids    Current Outpatient Medications  Medication Sig Dispense Refill   acyclovir  (ZOVIRAX ) 400 MG tablet Take 1 tablet (400 mg total) by  mouth daily. 30 tablet 3   alfuzosin (UROXATRAL) 10 MG 24 hr tablet Take 10 mg by mouth daily.     allopurinol  (ZYLOPRIM ) 300 MG tablet Take 1 tablet (300 mg total) by mouth daily. 30 tablet 3   amLODipine (NORVASC) 5 MG tablet Take 5 mg by mouth daily.      atorvastatin (LIPITOR) 10 MG tablet Take 10 mg by mouth daily at 6 PM.      Calcium Carbonate-Vitamin D 600-400 MG-UNIT per tablet Take 1 tablet by mouth daily.      cetirizine (ZYRTEC) 10 MG tablet Take 10 mg by mouth daily.      ciprofloxacin -dexamethasone  (CIPRODEX ) OTIC suspension Place 4 drops into both ears 2 (two) times daily for 5 days. DOS 10/22/23. 7.5 mL 0   Fiber Gummies 2 g CHEW Chew by mouth.     imipramine (TOFRANIL) 25 MG tablet Take 25 mg by mouth at bedtime.     Multiple Vitamin (MULTI-VITAMINS) TABS Take 1 tablet by mouth daily.      mupirocin  ointment (BACTROBAN ) 2 % Apply once daily with bandage change 22 g 1   omeprazole (PRILOSEC) 40 MG capsule Take 40 mg by mouth daily.      ondansetron  (ZOFRAN ) 8 MG tablet Take 1 tablet (8 mg total) by mouth every 8 (eight) hours as needed for nausea or vomiting. Start on the third day after chemotherapy. 60 tablet 2   prochlorperazine  (COMPAZINE ) 10 MG tablet Take 1 tablet (10 mg total) by mouth every 6 (six) hours as needed for nausea or vomiting. 60 tablet 2   tadalafil (CIALIS) 20 MG tablet Take 10 mg by mouth daily as needed.     No current facility-administered medications for this visit.   Facility-Administered Medications Ordered in Other Visits  Medication Dose Route Frequency Provider Last Rate Last Admin   0.9 %  sodium chloride  infusion   Intravenous Continuous Jacobo Evalene PARAS, MD   Stopped at 03/22/24 1418    OBJECTIVE: Vitals:   03/22/24 0905  BP: 132/77  Pulse: 77  Resp: 18  Temp: (!) 97.3 F (36.3 C)  SpO2: 100%       Body mass index is 29.14 kg/m.    ECOG FS:0 - Asymptomatic  General: Well-developed, well-nourished, no acute distress. Eyes: Pink conjunctiva, anicteric sclera. HEENT: Normocephalic, moist mucous membranes. Lungs: No audible wheezing or coughing. Heart: Regular rate and rhythm. Abdomen: Soft, nontender, no obvious distention. Musculoskeletal: No edema, cyanosis, or clubbing. Neuro: Alert, answering all questions appropriately. Cranial nerves grossly intact. Skin: No rashes or petechiae noted. Psych: Normal affect. Lymphatics: Left axillary lymph node minimally palpable.  LAB RESULTS:  Lab Results   Component Value Date   NA 141 03/22/2024   K 3.6 03/22/2024   CL 105 03/22/2024   CO2 25 03/22/2024   GLUCOSE 108 (H) 03/22/2024   BUN 10 03/22/2024   CREATININE 0.93 03/22/2024   CALCIUM 9.1 03/22/2024   PROT 5.8 (L) 03/22/2024   ALBUMIN 4.2 03/22/2024   AST 26 03/22/2024   ALT 18 03/22/2024   ALKPHOS 89 03/22/2024   BILITOT 0.4 03/22/2024   GFRNONAA >60 03/22/2024   GFRAA >60 12/21/2019    Lab Results  Component Value Date   WBC 3.6 (L) 03/22/2024   NEUTROABS 2.3 03/22/2024   HGB 12.1 (L) 03/22/2024   HCT 35.3 (L) 03/22/2024   MCV 90.3 03/22/2024   PLT 106 (L) 03/22/2024     STUDIES: No results found.   ONCOLOGY HISTORY: Patient  initially received treatment over 15 years ago with R-CHOP in [place] .  He then underwent rituximab  and Zevalin therapy in June 2014.  He did not require any treatment until imaging with CT scan on December 28, 2022 reported continued progression of disease.  Patient also had a mild pancytopenia and increased fatigue.  He agreed to weekly Rituxan  x 4 for control of disease.  Patient completed week 4 of treatment on February 03, 2023.  Repeat imaging on March 18, 2023 reviewed independently with a mixed response to therapy.  Because of this, patient agreed to maintenance Rituxan  every 8 weeks for a total of 2 years.  Repeat CT scan on Aug 18, 2023 reviewed independently with again a mixed response to therapy.  Further imaging with PET scan on November 24, 2023 reviewed independently and report as above with significant progression of disease.  Patient subsequently initiated Rituxan  and Treanda  on December 29, 2023.  ASSESSMENT: Progressive grade 2 follicular lymphoma.  PLAN:    Progressive grade 2 follicular lymphoma: See oncology history as above.  PET scan on November 24, 2023 revealed significant progression of disease.  Proceed with Treanda  and Rituxan  on day 1 with Treanda  on day 2 every 28 days.  Plan to do 4 cycles and then reimage.   Proceed with cycle 4, day 1 of treatment today.  Return to clinic tomorrow for Treanda  only.  Patient to then return to clinic in 4 weeks for further evaluation and consideration of cycle 5, day 1.  Will reimage with PET scan prior to next cycle and plan to do a total of 6 cycles.  He has been instructed to continue allopurinol  and acyclovir . Axillary lymphadenopathy: Nearly resolved.  Systemic treatment as above.  Can consider XRT in the future if necessary.   Leukopenia: Total white blood cell count mildly improved to 3.6.  Proceed with treatment as above. Anemia: Mild.  Patient's hemoglobin is 12.1.  Previously patient was noted to have a mildly decreased iron saturation ratio, otherwise his iron panel is within normal limits. Thrombocytopenia: Platelet count decreased, but essentially stable at 106.  Proceed with treatment as above. History of reaction to Rituxan : Likely rate based.  Additional premeds have been added for the remainder of his treatments.  This has not recurred. Hearing loss/fluid: Resolved with tube placement by ENT.   Patient expressed understanding and was in agreement with this plan. He also understands that He can call clinic at any time with any questions, concerns, or complaints.    Evalene JINNY Reusing, MD   03/22/2024 6:49 PM

## 2024-03-22 NOTE — Patient Instructions (Signed)
 [facility] - A DEPT OF [facility]  Discharge Instructions: Thank you for choosing [facility] to provide your oncology and hematology care.  If you have a lab appointment with the Cancer Center, please go directly to the Cancer Center and check in at the registration area.  Wear comfortable clothing and clothing appropriate for easy access to any Portacath or PICC line.   We strive to give you quality time with your provider. You may need to reschedule your appointment if you arrive late (15 or more minutes).  Arriving late affects you and other patients whose appointments are after yours.  Also, if you miss three or more appointments without notifying the office, you may be dismissed from the clinic at the providers discretion.      For prescription refill requests, have your pharmacy contact our office and allow 72 hours for refills to be completed.    Today you received the following chemotherapy and/or immunotherapy agents Rituxan , Bendamustine       To help prevent nausea and vomiting after your treatment, we encourage you to take your nausea medication as directed.  BELOW ARE SYMPTOMS THAT SHOULD BE REPORTED IMMEDIATELY: *FEVER GREATER THAN 100.4 F (38 C) OR HIGHER *CHILLS OR SWEATING *NAUSEA AND VOMITING THAT IS NOT CONTROLLED WITH YOUR NAUSEA MEDICATION *UNUSUAL SHORTNESS OF BREATH *UNUSUAL BRUISING OR BLEEDING *URINARY PROBLEMS (pain or burning when urinating, or frequent urination) *BOWEL PROBLEMS (unusual diarrhea, constipation, pain near the anus) TENDERNESS IN MOUTH AND THROAT WITH OR WITHOUT PRESENCE OF ULCERS (sore throat, sores in mouth, or a toothache) UNUSUAL RASH, SWELLING OR PAIN  UNUSUAL VAGINAL DISCHARGE OR ITCHING   Items with * indicate a potential emergency and should be followed up as soon as possible or go to the Emergency Department if any problems should occur.  Please show the CHEMOTHERAPY ALERT CARD or  IMMUNOTHERAPY ALERT CARD at check-in to the Emergency Department and triage nurse.  Should you have questions after your visit or need to cancel or reschedule your appointment, please contact [facility] - A DEPT OF [facility]  [phone] and follow the prompts.  Office hours are 8:00 a.m. to 4:30 p.m. Monday - Friday. Please note that voicemails left after 4:00 p.m. may not be returned until the following business day.  We are closed weekends and major holidays. You have access to a nurse at all times for urgent questions. Please call the main number to the clinic [phone] and follow the prompts.  For any non-urgent questions, you may also contact your provider using MyChart. We now offer e-Visits for anyone 68 and older to request care online for non-urgent symptoms. For details visit [email or web address].   Also download the MyChart app! Go to the app store, search MyChart, open the app, select [facility], and log in with your MyChart username and password.  Rituximab  Injection What is this medication? RITUXIMAB  (ri TUX i mab) treats leukemia and lymphoma. It works by blocking a protein that causes cancer cells to grow and multiply. This helps to slow or stop the spread of cancer cells. It may also be used to treat autoimmune conditions, such as arthritis. It works by slowing down an overactive immune system. It is a monoclonal antibody. This medicine may be used for other purposes; ask your health care provider or pharmacist if you have questions. COMMON BRAND NAME(S): RIABNI , Rituxan , RUXIENCE , truxima  What should I tell my  care team before I take this medication? They need to know if you have any of these conditions: Chest pain Heart disease Immune system problems Infection, such as chickenpox, cold sores, hepatitis B, herpes Irregular heartbeat or rhythm Kidney disease Low blood counts, such as low white cells, platelets, red cells Lung  disease Recent or upcoming vaccine An unusual or allergic reaction to rituximab , other medications, foods, dyes, or preservatives Pregnant or trying to get pregnant Breast-feeding How should I use this medication? This medication is injected into a vein. It is given by a care team in a hospital or clinic setting. A special MedGuide will be given to you before each treatment. Be sure to read this information carefully each time. Talk to your care team about the use of this medication in children. While this medication may be prescribed for children as young as 6 months for selected conditions, precautions do apply. Overdosage: If you think you have taken too much of this medicine contact a poison control center or emergency room at once. NOTE: This medicine is only for you. Do not share this medicine with others. What if I miss a dose? Keep appointments for follow-up doses. It is important not to miss your dose. Call your care team if you are unable to keep an appointment. What may interact with this medication? Do not take this medication with any of the following: Live vaccines This medication may also interact with the following: Cisplatin This list may not describe all possible interactions. Give your health care provider a list of all the medicines, herbs, non-prescription drugs, or dietary supplements you use. Also tell them if you smoke, drink alcohol, or use illegal drugs. Some items may interact with your medicine. What should I watch for while using this medication? Your condition will be monitored carefully while you are receiving this medication. You may need blood work while taking this medication. This medication can cause serious infusion reactions. To reduce the risk your care team may give you other medications to take before receiving this one. Be sure to follow the directions from your care team. This medication may increase your risk of getting an infection. Call your care  team for advice if you get a fever, chills, sore throat, or other symptoms of a cold or flu. Do not treat yourself. Try to avoid being around people who are sick. Call your care team if you are around anyone with measles, chickenpox, or if you develop sores or blisters that do not heal properly. Avoid taking medications that contain aspirin, acetaminophen , ibuprofen, naproxen, or ketoprofen unless instructed by your care team. These medications may hide a fever. This medication may cause serious skin reactions. They can happen weeks to months after starting the medication. Contact your care team right away if you notice fevers or flu-like symptoms with a rash. The rash may be red or purple and then turn into blisters or peeling of the skin. You may also notice a red rash with swelling of the face, lips, or lymph nodes in your neck or under your arms. In some patients, this medication may cause a serious brain infection that may cause death. If you have any problems seeing, thinking, speaking, walking, or standing, tell your care team right away. If you cannot reach your care team, urgently seek another source of medical care. Talk to your care team if you may be pregnant. Serious birth defects can occur if you take this medication during pregnancy and for 12 months after  the last dose. You will need a negative pregnancy test before starting this medication. Contraception is recommended while taking this medication and for 12 months after the last dose. Your care team can help you find the option that works for you. Do not breastfeed while taking this medication and for at least 6 months after the last dose. What side effects may I notice from receiving this medication? Side effects that you should report to your care team as soon as possible: Allergic reactions or angioedema--skin rash, itching or hives, swelling of the face, eyes, lips, tongue, arms, or legs, trouble swallowing or breathing Bowel  blockage--stomach cramping, unable to have a bowel movement or pass gas, loss of appetite, vomiting Dizziness, loss of balance or coordination, confusion or trouble speaking Heart attack--pain or tightness in the chest, shoulders, arms, or jaw, nausea, shortness of breath, cold or clammy skin, feeling faint or lightheaded Heart rhythm changes--fast or irregular heartbeat, dizziness, feeling faint or lightheaded, chest pain, trouble breathing Infection--fever, chills, cough, sore throat, wounds that don't heal, pain or trouble when passing urine, general feeling of discomfort or being unwell Infusion reactions--chest pain, shortness of breath or trouble breathing, feeling faint or lightheaded Kidney injury--decrease in the amount of urine, swelling of the ankles, hands, or feet Liver injury--right upper belly pain, loss of appetite, nausea, light-colored stool, dark yellow or brown urine, yellowing skin or eyes, unusual weakness or fatigue Redness, blistering, peeling, or loosening of the skin, including inside the mouth Stomach pain that is severe, does not go away, or gets worse Tumor lysis syndrome (TLS)--nausea, vomiting, diarrhea, decrease in the amount of urine, dark urine, unusual weakness or fatigue, confusion, muscle pain or cramps, fast or irregular heartbeat, joint pain Side effects that usually do not require medical attention (report to your care team if they continue or are bothersome): Headache Joint pain Nausea Runny or stuffy nose Unusual weakness or fatigue This list may not describe all possible side effects. Call your doctor for medical advice about side effects. You may report side effects to FDA at 1-800-FDA-1088. Where should I keep my medication? This medication is given in a hospital or clinic. It will not be stored at home. NOTE: This sheet is a summary. It may not cover all possible information. If you have questions about this medicine, talk to your doctor, pharmacist,  or health care provider.  2024 Elsevier/Gold Standard (2021-08-14 00:00:00)  Bendamustine  Injection What is this medication? BENDAMUSTINE  (BEN da MUS teen) treats leukemia and lymphoma. It works by slowing down the growth of cancer cells. This medicine may be used for other purposes; ask your health care provider or pharmacist if you have questions. COMMON BRAND NAME(S): BELRAPZO , BENDEKA , Treanda , VIVIMUSTA  What should I tell my care team before I take this medication? They need to know if you have any of these conditions: Infection, especially a viral infection, such as chickenpox, cold sores, herpes Kidney disease Liver disease An unusual or allergic reaction to bendamustine , mannitol, other medications, foods, dyes, or preservatives Pregnant or trying to get pregnant Breast-feeding How should I use this medication? This medication is injected into a vein. It is given by your care team in a hospital or clinic setting. Talk to your care team about the use of this medication in children. Special care may be needed. Overdosage: If you think you have taken too much of this medicine contact a poison control center or emergency room at once. NOTE: This medicine is only for you. Do not share  this medicine with others. What if I miss a dose? Keep appointments for follow-up doses. It is important not to miss your dose. Call your care team if you are unable to keep an appointment. What may interact with this medication? Do not take this medication with any of the following: Clozapine This medication may also interact with the following: Atazanavir Cimetidine Ciprofloxacin  Enoxacin Fluvoxamine Medications for seizures, such as carbamazepine, phenobarbital Mexiletine Rifampin Tacrine Thiabendazole Zileuton This list may not describe all possible interactions. Give your health care provider a list of all the medicines, herbs, non-prescription drugs, or dietary supplements you use. Also  tell them if you smoke, drink alcohol, or use illegal drugs. Some items may interact with your medicine. What should I watch for while using this medication? Visit your care team for regular checks on your progress. This medication may make you feel generally unwell. This is not uncommon, as chemotherapy can affect healthy cells as well as cancer cells. Report any side effects. Continue your course of treatment even though you feel ill unless your care team tells you to stop. You may need blood work while taking this medication. This medication may increase your risk of getting an infection. Call your care team for advice if you get a fever, chills, sore throat, or other symptoms of a cold or flu. Do not treat yourself. Try to avoid being around people who are sick. This medication may cause serious skin reactions. They can happen weeks to months after starting the medication. Contact your care team right away if you notice fevers or flu-like symptoms with a rash. The rash may be red or purple and then turn into blisters or peeling of the skin. You may also notice a red rash with swelling of the face, lips, or lymph nodes in your neck or under your arms. In some patients, this medication may cause a serious brain infection that may cause death. If you have any problems seeing, thinking, speaking, walking, or standing, tell your care team right away. If you cannot reach your care team, urgently seek other source of medical care. This medication may increase your risk to bruise or bleed. Call your care team if you notice any unusual bleeding. Talk to your care team about your risk of cancer. You may be more at risk for certain types of cancer if you take this medication. Talk to your care team about your risk of skin cancer. You may be more at risk for skin cancer if you take this medication. Talk to your care team if you or your partner wish to become pregnant or think either of you might be pregnant. This  medication can cause serious birth defects if taken during pregnancy or for up to 6 months after the last dose. A negative pregnancy test is required before starting this medication. A reliable form of contraception is recommended while taking this medication and for 6 months after the last dose. Talk to your care team about reliable forms of contraception. Wear a condom while taking this medication and for at least 3 months after the last dose. Do not breast-feed while taking this medication or for at least 1 week after the last dose. This medication may cause infertility. Talk to your care team if you are concerned about your fertility. What side effects may I notice from receiving this medication? Side effects that you should report to your care team as soon as possible: Allergic reactions--skin rash, itching, hives, swelling of the face, lips,  tongue, or throat Infection--fever, chills, cough, sore throat, wounds that don't heal, pain or trouble when passing urine, general feeling of discomfort or being unwell Infusion reactions--chest pain, shortness of breath or trouble breathing, feeling faint or lightheaded Liver injury--right upper belly pain, loss of appetite, nausea, light-colored stool, dark yellow or brown urine, yellowing skin or eyes, unusual weakness or fatigue Low red blood cell level--unusual weakness or fatigue, dizziness, headache, trouble breathing Painful swelling, warmth, or redness of the skin, blisters or sores at the infusion site Rash, fever, and swollen lymph nodes Redness, blistering, peeling, or loosening of the skin, including inside the mouth Tumor lysis syndrome (TLS)--nausea, vomiting, diarrhea, decrease in the amount of urine, dark urine, unusual weakness or fatigue, confusion, muscle pain or cramps, fast or irregular heartbeat, joint pain Unusual bruising or bleeding Side effects that usually do not require medical attention (report to your care team if they  continue or are bothersome): Diarrhea Fatigue Headache Loss of appetite Nausea Vomiting This list may not describe all possible side effects. Call your doctor for medical advice about side effects. You may report side effects to FDA at 1-800-FDA-1088. Where should I keep my medication? This medication is given in a hospital or clinic. It will not be stored at home. NOTE: This sheet is a summary. It may not cover all possible information. If you have questions about this medicine, talk to your doctor, pharmacist, or health care provider.  2024 Elsevier/Gold Standard (2021-07-15 00:00:00)

## 2024-03-22 NOTE — Progress Notes (Signed)
 Patient wants to know how long he needs  to continue to take allopurinol , and the acyclovir .   The dermatologist removed a spot on his left hand and his right arm.

## 2024-03-23 ENCOUNTER — Ambulatory Visit

## 2024-03-23 VITALS — BP 113/59 | HR 79 | Temp 97.2°F | Resp 18

## 2024-03-23 DIAGNOSIS — C8218 Follicular lymphoma grade II, lymph nodes of multiple sites: Secondary | ICD-10-CM

## 2024-03-23 DIAGNOSIS — Z5111 Encounter for antineoplastic chemotherapy: Secondary | ICD-10-CM | POA: Diagnosis not present

## 2024-03-23 MED ORDER — DEXAMETHASONE SOD PHOSPHATE PF 10 MG/ML IJ SOLN
10.0000 mg | Freq: Once | INTRAMUSCULAR | Status: AC
  Administered 2024-03-23: 14:00:00 10 mg via INTRAVENOUS

## 2024-03-23 MED ORDER — SODIUM CHLORIDE 0.9 % IV SOLN
INTRAVENOUS | Status: DC
  Filled 2024-03-23: qty 250

## 2024-03-23 MED ORDER — SODIUM CHLORIDE 0.9 % IV SOLN
90.0000 mg/m2 | Freq: Once | INTRAVENOUS | Status: AC
  Administered 2024-03-23: 14:00:00 200 mg via INTRAVENOUS
  Filled 2024-03-23: qty 8

## 2024-03-25 ENCOUNTER — Other Ambulatory Visit: Payer: Self-pay | Admitting: Oncology

## 2024-03-25 DIAGNOSIS — C8218 Follicular lymphoma grade II, lymph nodes of multiple sites: Secondary | ICD-10-CM

## 2024-03-27 ENCOUNTER — Encounter: Payer: Self-pay | Admitting: Oncology

## 2024-04-04 ENCOUNTER — Ambulatory Visit: Admission: RE | Admit: 2024-04-04 | Source: Ambulatory Visit

## 2024-04-04 DIAGNOSIS — C8218 Follicular lymphoma grade II, lymph nodes of multiple sites: Secondary | ICD-10-CM | POA: Insufficient documentation

## 2024-04-04 DIAGNOSIS — N400 Enlarged prostate without lower urinary tract symptoms: Secondary | ICD-10-CM | POA: Insufficient documentation

## 2024-04-04 DIAGNOSIS — K429 Umbilical hernia without obstruction or gangrene: Secondary | ICD-10-CM | POA: Insufficient documentation

## 2024-04-04 DIAGNOSIS — D3502 Benign neoplasm of left adrenal gland: Secondary | ICD-10-CM | POA: Insufficient documentation

## 2024-04-04 DIAGNOSIS — I700 Atherosclerosis of aorta: Secondary | ICD-10-CM | POA: Diagnosis not present

## 2024-04-04 DIAGNOSIS — G319 Degenerative disease of nervous system, unspecified: Secondary | ICD-10-CM | POA: Insufficient documentation

## 2024-04-04 DIAGNOSIS — D3501 Benign neoplasm of right adrenal gland: Secondary | ICD-10-CM | POA: Insufficient documentation

## 2024-04-04 DIAGNOSIS — I517 Cardiomegaly: Secondary | ICD-10-CM | POA: Diagnosis not present

## 2024-04-04 LAB — GLUCOSE, CAPILLARY: Glucose-Capillary: 97 mg/dL (ref 70–99)

## 2024-04-04 MED ORDER — FLUDEOXYGLUCOSE F - 18 (FDG) INJECTION
9.9800 | Freq: Once | INTRAVENOUS | Status: AC | PRN
  Administered 2024-04-04: 9.98 via INTRAVENOUS

## 2024-04-17 ENCOUNTER — Ambulatory Visit: Admitting: Certified Registered"

## 2024-04-17 ENCOUNTER — Encounter: Payer: Self-pay | Admitting: Surgery

## 2024-04-17 ENCOUNTER — Encounter: Admission: AD | Disposition: A | Payer: Self-pay | Source: Ambulatory Visit | Attending: Surgery

## 2024-04-17 ENCOUNTER — Observation Stay
Admission: AD | Admit: 2024-04-17 | Discharge: 2024-04-18 | Disposition: A | Discharge status: Home / Self Care | Source: Ambulatory Visit | Attending: Surgery | Admitting: Surgery

## 2024-04-17 ENCOUNTER — Ambulatory Visit: Payer: Self-pay | Admitting: Surgery

## 2024-04-17 ENCOUNTER — Other Ambulatory Visit: Payer: Self-pay

## 2024-04-17 DIAGNOSIS — Z79899 Other long term (current) drug therapy: Secondary | ICD-10-CM | POA: Diagnosis not present

## 2024-04-17 DIAGNOSIS — K421 Umbilical hernia with gangrene: Principal | ICD-10-CM | POA: Insufficient documentation

## 2024-04-17 DIAGNOSIS — I10 Essential (primary) hypertension: Secondary | ICD-10-CM | POA: Diagnosis not present

## 2024-04-17 DIAGNOSIS — K429 Umbilical hernia without obstruction or gangrene: Secondary | ICD-10-CM

## 2024-04-17 DIAGNOSIS — K420 Umbilical hernia with obstruction, without gangrene: Principal | ICD-10-CM | POA: Diagnosis present

## 2024-04-17 HISTORY — PX: UMBILICAL HERNIA REPAIR: SHX196

## 2024-04-17 SURGERY — REPAIR, HERNIA, UMBILICAL, ADULT
Anesthesia: General | Site: Abdomen

## 2024-04-17 MED ORDER — OXYCODONE HCL 5 MG PO TABS: 5.0000 mg | ORAL_TABLET | Freq: Once | ORAL | Status: DC | PRN

## 2024-04-17 MED ORDER — IMIPRAMINE HCL 25 MG PO TABS: 25.0000 mg | ORAL_TABLET | Freq: Every day | ORAL | Status: DC

## 2024-04-17 MED ORDER — ONDANSETRON HCL 4 MG/2ML IJ SOLN
INTRAMUSCULAR | Status: AC
  Filled 2024-04-17: qty 2

## 2024-04-17 MED ORDER — ONDANSETRON HCL 4 MG PO TABS: 8.0000 mg | ORAL_TABLET | Freq: Three times a day (TID) | ORAL | Status: DC | PRN

## 2024-04-17 MED ORDER — PROPOFOL 10 MG/ML IV BOLUS
INTRAVENOUS | Status: AC
  Filled 2024-04-17: qty 20

## 2024-04-17 MED ORDER — MIDAZOLAM HCL 2 MG/2ML IJ SOLN
INTRAMUSCULAR | Status: AC
  Filled 2024-04-17: qty 2

## 2024-04-17 MED ORDER — BUPIVACAINE-EPINEPHRINE (PF) 0.5% -1:200000 IJ SOLN
INTRAMUSCULAR | Status: AC
  Filled 2024-04-17: qty 30

## 2024-04-17 MED ORDER — AMLODIPINE BESYLATE 5 MG PO TABS
5.0000 mg | ORAL_TABLET | Freq: Every day | ORAL | Status: DC
  Administered 2024-04-18: 5 mg via ORAL
  Filled 2024-04-17: qty 1

## 2024-04-17 MED ORDER — FENTANYL CITRATE (PF) 100 MCG/2ML IJ SOLN: 25.0000 ug | INTRAMUSCULAR | Status: DC | PRN

## 2024-04-17 MED ORDER — ROCURONIUM BROMIDE 10 MG/ML (PF) SYRINGE
PREFILLED_SYRINGE | INTRAVENOUS | Status: AC
  Filled 2024-04-17: qty 10

## 2024-04-17 MED ORDER — PANTOPRAZOLE SODIUM 40 MG PO TBEC
40.0000 mg | DELAYED_RELEASE_TABLET | Freq: Every day | ORAL | Status: DC
  Administered 2024-04-18: 40 mg via ORAL
  Filled 2024-04-17: qty 1

## 2024-04-17 MED ORDER — PROCHLORPERAZINE MALEATE 10 MG PO TABS: 10.0000 mg | ORAL_TABLET | Freq: Four times a day (QID) | ORAL | Status: DC | PRN

## 2024-04-17 MED ORDER — ACETAMINOPHEN 325 MG PO TABS: 650.0000 mg | ORAL_TABLET | Freq: Three times a day (TID) | ORAL | Status: DC | PRN

## 2024-04-17 MED ORDER — ONDANSETRON 4 MG PO TBDP: 4.0000 mg | ORAL_TABLET | Freq: Four times a day (QID) | ORAL | Status: DC | PRN

## 2024-04-17 MED ORDER — LIDOCAINE HCL (PF) 2 % IJ SOLN
INTRAMUSCULAR | Status: AC
  Filled 2024-04-17: qty 5

## 2024-04-17 MED ORDER — MIDAZOLAM HCL (PF) 2 MG/2ML IJ SOLN
INTRAMUSCULAR | Status: DC | PRN
  Administered 2024-04-17: 1 mg via INTRAVENOUS

## 2024-04-17 MED ORDER — BUPIVACAINE-EPINEPHRINE (PF) 0.5% -1:200000 IJ SOLN
INTRAMUSCULAR | Status: DC | PRN
  Administered 2024-04-17: 20 mL

## 2024-04-17 MED ORDER — OXYCODONE HCL 5 MG/5ML PO SOLN: 5.0000 mg | Freq: Once | ORAL | Status: DC | PRN

## 2024-04-17 MED ORDER — CIPROFLOXACIN-DEXAMETHASONE 0.3-0.1 % OT SUSP
4.0000 [drp] | Freq: Two times a day (BID) | OTIC | Status: DC
  Filled 2024-04-17: qty 7.5

## 2024-04-17 MED ORDER — CELECOXIB 200 MG PO CAPS
ORAL_CAPSULE | ORAL | Status: AC
  Filled 2024-04-17: qty 1

## 2024-04-17 MED ORDER — ACETAMINOPHEN 500 MG PO TABS
ORAL_TABLET | ORAL | Status: AC
  Filled 2024-04-17: qty 2

## 2024-04-17 MED ORDER — ACYCLOVIR 400 MG PO TABS
400.0000 mg | ORAL_TABLET | Freq: Every day | ORAL | Status: DC
  Administered 2024-04-18: 400 mg via ORAL
  Filled 2024-04-17: qty 2

## 2024-04-17 MED ORDER — CHLORHEXIDINE GLUCONATE CLOTH 2 % EX PADS: 6.0000 | MEDICATED_PAD | Freq: Once | CUTANEOUS | Status: DC

## 2024-04-17 MED ORDER — ATORVASTATIN CALCIUM 10 MG PO TABS: 10.0000 mg | ORAL_TABLET | Freq: Every day | ORAL | Status: DC

## 2024-04-17 MED ORDER — DEXAMETHASONE SOD PHOSPHATE PF 10 MG/ML IJ SOLN
INTRAMUSCULAR | Status: AC
  Filled 2024-04-17: qty 1

## 2024-04-17 MED ORDER — ROCURONIUM BROMIDE 100 MG/10ML IV SOLN
INTRAVENOUS | Status: DC | PRN
  Administered 2024-04-17: 50 mg via INTRAVENOUS

## 2024-04-17 MED ORDER — GABAPENTIN 300 MG PO CAPS
300.0000 mg | ORAL_CAPSULE | ORAL | Status: AC
  Administered 2024-04-17: 300 mg via ORAL

## 2024-04-17 MED ORDER — DEXAMETHASONE SOD PHOSPHATE PF 10 MG/ML IJ SOLN
INTRAMUSCULAR | Status: DC | PRN
  Administered 2024-04-17: 10 mg via INTRAVENOUS

## 2024-04-17 MED ORDER — FENTANYL CITRATE (PF) 100 MCG/2ML IJ SOLN
INTRAMUSCULAR | Status: DC | PRN
  Administered 2024-04-17: 25 ug via INTRAVENOUS
  Administered 2024-04-17: 50 ug via INTRAVENOUS
  Administered 2024-04-17: 25 ug via INTRAVENOUS

## 2024-04-17 MED ORDER — CEFAZOLIN SODIUM-DEXTROSE 2-4 GM/100ML-% IV SOLN
2.0000 g | INTRAVENOUS | Status: AC
  Administered 2024-04-17: .5 g via INTRAVENOUS

## 2024-04-17 MED ORDER — DROPERIDOL 2.5 MG/ML IJ SOLN: 0.6250 mg | Freq: Once | INTRAMUSCULAR | Status: DC | PRN

## 2024-04-17 MED ORDER — CEFAZOLIN SODIUM-DEXTROSE 2-4 GM/100ML-% IV SOLN
INTRAVENOUS | Status: AC
  Filled 2024-04-17: qty 100

## 2024-04-17 MED ORDER — 0.9 % SODIUM CHLORIDE (POUR BTL) OPTIME
TOPICAL | Status: DC | PRN
  Administered 2024-04-17: 500 mL

## 2024-04-17 MED ORDER — TRAMADOL HCL 50 MG PO TABS: 50.0000 mg | ORAL_TABLET | Freq: Four times a day (QID) | ORAL | Status: DC | PRN

## 2024-04-17 MED ORDER — CALCIUM CARBONATE-VITAMIN D 600-400 MG-UNIT PO TABS: 1.0000 | ORAL_TABLET | Freq: Every day | ORAL | Status: DC

## 2024-04-17 MED ORDER — MORPHINE SULFATE (PF) 2 MG/ML IV SOLN: 1.0000 mg | INTRAVENOUS | Status: DC | PRN

## 2024-04-17 MED ORDER — ACETAMINOPHEN 500 MG PO TABS
1000.0000 mg | ORAL_TABLET | ORAL | Status: AC
  Administered 2024-04-17: 1000 mg via ORAL

## 2024-04-17 MED ORDER — LACTATED RINGERS IV SOLN: INTRAVENOUS | Status: DC

## 2024-04-17 MED ORDER — DEXMEDETOMIDINE HCL IN NACL 80 MCG/20ML IV SOLN
INTRAVENOUS | Status: DC | PRN
  Administered 2024-04-17: 8 ug via INTRAVENOUS

## 2024-04-17 MED ORDER — FENTANYL CITRATE (PF) 100 MCG/2ML IJ SOLN
INTRAMUSCULAR | Status: AC
  Filled 2024-04-17: qty 2

## 2024-04-17 MED ORDER — SODIUM CHLORIDE 0.9 % IV SOLN
3.0000 g | Freq: Four times a day (QID) | INTRAVENOUS | Status: DC
  Administered 2024-04-17 – 2024-04-18 (×3): 3 g via INTRAVENOUS
  Filled 2024-04-17 (×5): qty 8

## 2024-04-17 MED ORDER — OXYCODONE HCL 5 MG PO TABS: 5.0000 mg | ORAL_TABLET | ORAL | Status: DC | PRN

## 2024-04-17 MED ORDER — LORATADINE 10 MG PO TABS
10.0000 mg | ORAL_TABLET | Freq: Every day | ORAL | Status: DC
  Administered 2024-04-18: 10 mg via ORAL
  Filled 2024-04-17: qty 1

## 2024-04-17 MED ORDER — LIDOCAINE HCL (CARDIAC) PF 100 MG/5ML IV SOSY
PREFILLED_SYRINGE | INTRAVENOUS | Status: DC | PRN
  Administered 2024-04-17: 80 mg via INTRAVENOUS

## 2024-04-17 MED ORDER — SUGAMMADEX SODIUM 200 MG/2ML IV SOLN
INTRAVENOUS | Status: DC | PRN
  Administered 2024-04-17: 400 mg via INTRAVENOUS

## 2024-04-17 MED ORDER — ONDANSETRON HCL 4 MG/2ML IJ SOLN: 4.0000 mg | Freq: Four times a day (QID) | INTRAMUSCULAR | Status: DC | PRN

## 2024-04-17 MED ORDER — HYDROMORPHONE HCL 1 MG/ML IJ SOLN
INTRAMUSCULAR | Status: AC
  Filled 2024-04-17: qty 1

## 2024-04-17 MED ORDER — CELECOXIB 200 MG PO CAPS
200.0000 mg | ORAL_CAPSULE | ORAL | Status: AC
  Administered 2024-04-17: 200 mg via ORAL

## 2024-04-17 MED ORDER — ALFUZOSIN HCL ER 10 MG PO TB24
10.0000 mg | ORAL_TABLET | Freq: Every day | ORAL | Status: DC
  Administered 2024-04-18: 10 mg via ORAL
  Filled 2024-04-17: qty 1

## 2024-04-17 MED ORDER — ONDANSETRON HCL 4 MG/2ML IJ SOLN
INTRAMUSCULAR | Status: DC | PRN
  Administered 2024-04-17: 4 mg via INTRAVENOUS

## 2024-04-17 MED ORDER — ALLOPURINOL 100 MG PO TABS
300.0000 mg | ORAL_TABLET | Freq: Every day | ORAL | Status: DC
  Administered 2024-04-18: 300 mg via ORAL
  Filled 2024-04-17: qty 3

## 2024-04-17 MED ORDER — MUPIROCIN 2 % EX OINT
TOPICAL_OINTMENT | Freq: Every day | CUTANEOUS | Status: DC
  Filled 2024-04-17: qty 22

## 2024-04-17 MED ORDER — GABAPENTIN 300 MG PO CAPS
ORAL_CAPSULE | ORAL | Status: AC
  Filled 2024-04-17: qty 1

## 2024-04-17 MED ORDER — PROPOFOL 10 MG/ML IV BOLUS
INTRAVENOUS | Status: DC | PRN
  Administered 2024-04-17: 110 mg via INTRAVENOUS

## 2024-04-17 MED ORDER — ACETAMINOPHEN 10 MG/ML IV SOLN: 1000.0000 mg | Freq: Once | INTRAVENOUS | Status: DC | PRN

## 2024-04-17 NOTE — Op Note (Signed)
 Preoperative diagnosis: umbilical hernia, strangulated Postoperative diagnosis: same  Procedure:  Open umbilical hernia repair  Anesthesia: LMA  Surgeon: Henriette Pierre  Wound Classification: Clean  Specimen: none  Complications: None  Estimated Blood Loss: minimal  Indications:see HPI  Findings: 2cm x 2cm strangulated umbilical hernia 2. Tension free repair achieved with suture 3. Adequate hemostasis  Description of procedure: The patient was brought to the operating room and general anesthesia was induced. A time-out was completed verifying correct patient, procedure, site, positioning, and implant(s) and/or special equipment prior to beginning this procedure. Antibiotics were administered prior to making the incision. SCDs placed. The anterior abdominal wall was prepped and draped in the standard sterile fashion.   An infraumbilical incision was made after infusing the preplanned incision with half percent Marcaine .  Dissection carried down and around necrotic hernia sac to fascia superior to the umbilical stalk was noted.  Hernia sac gently opened and strangulated sac with omental contents noted.  This was transected with ligasure, and sac passed off field pending pathology. Hemostasis was confirmed prior to reducing the stump contents. Inspection of abdominal cavity noted small bowel, undamaged.   The defect itself was primary closed using 0 Ethibond in an interrupted fashion.  The fascia as well as the skin incision was then infused with local. Area extensively irrigated due to infection present within the field secondary to necrotic tissue.    After confirming hemostasis, wound was irrigated and closed in a multilayer fashion, using 3-0 Vicryl for the deep dermal layer in an interrupted fashion and staples for skin.  Wound was then dressed with honeycomb dressing.  Patient was then successfully awakened and transferred to PACU in stable condition.  At the end of the procedure sponge  and instrument counts were correct

## 2024-04-17 NOTE — Anesthesia Procedure Notes (Signed)
 Procedure Name: Intubation Date/Time: 04/17/2024 6:12 PM  Performed by: Veronica Alm BROCKS, CRNAPre-anesthesia Checklist: Patient identified, Patient being monitored, Timeout performed, Emergency Drugs available and Suction available Patient Re-evaluated:Patient Re-evaluated prior to induction Oxygen Delivery Method: Circle system utilized Preoxygenation: Pre-oxygenation with 100% oxygen Induction Type: IV induction Ventilation: Mask ventilation without difficulty Laryngoscope Size: 3 and McGrath Grade View: Grade II Tube type: Oral Tube size: 7.0 mm Number of attempts: 1 Airway Equipment and Method: Stylet Placement Confirmation: ETT inserted through vocal cords under direct vision, positive ETCO2 and breath sounds checked- equal and bilateral Secured at: 21 cm Tube secured with: Tape Dental Injury: Teeth and Oropharynx as per pre-operative assessment

## 2024-04-17 NOTE — H&P (View-Only) (Signed)
 Subjective:   CC: Umbilical hernia with gangrene and obstruction [K42.1]  HPI: referred by [name], MD for evaluation of above.   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  He has had an umbilical hernia for 5-6 years that was previously reducible. Four to five days ago he developed pain at the hernia site, which is now non-reducible.  Over the past year he has lost 25 pounds. In the past couple of days he developed diarrhea. He denies nausea and vomiting. He has not taken pain medications and is not on anticoagulation.   Past Medical History:  has a past medical history of B-cell lymphoma (CMS/HHS-HCC), BPH (benign prostatic hyperplasia), Colon polyps, GERD (gastroesophageal reflux disease), Hyperlipidemia, Hypertension, and Sleep apnea.  Past Surgical History:  Past Surgical History:  Procedure Laterality Date   HEMORRHOIDECTOMY EXTERNAL  2008   COLONOSCOPY  11/11/2006   Int Hemorrhoids, Diverticulosis (Dr. EMERSON Remington @ [facility] [place], [place])    COLONOSCOPY  01/11/2012   Adenomatous Polyps: CBF 01/2017; OV made 02/19/2017 @ 8:30am w/Kim Moishe NP (dw)   EGD  02/23/2013   No repeat per RTE   COLONOSCOPY  02/11/2017   12mm Adenomatous Polyp: CBF 02/2020   COLONOSCOPY  2022   COLONOSCOPY  10/28/2020   Tubular adenoma/PHx CPNo repeat due to age/TKT   TONSILLECTOMY      Family History: family history includes Brain cancer in his mother; Colon cancer in his maternal grandmother; Colon polyps in his maternal uncle; Heart failure in his father.  Social History:  reports that he has never smoked. He has never been exposed to tobacco smoke. He has never used smokeless tobacco. He reports current alcohol use. He reports that he does not use drugs.  Current Medications: has a current medication list which includes the following prescription(s): acyclovir , alfuzosin ,  allopurinol , amlodipine , atorvastatin , calcium  carbonate-vitamin d3, cetirizine, multivitamin, omeprazole, ondansetron , prochlorperazine , and tadalafil.  Allergies:  Allergies as of 04/17/2024 - Reviewed 04/17/2024  Allergen Reaction Noted   Adhesive tape-silicones Rash 06/24/2013    ROS:  A 15 point review of systems was performed and pertinent positives and negatives noted in HPI   Objective:     BP (!) 141/78   Pulse 96   Ht 179.1 cm (5' 10.5)   Wt 88.9 kg (196 lb)   BMI 27.73 kg/m   Constitutional :  Alert, cooperative, no distress  Lymphatics/Throat:  Supple, no lymphadenopathy  Respiratory:  clear to auscultation bilaterally  Cardiovascular:  regular rate and rhythm  Gastrointestinal: soft, non-tender; bowel sounds normal; no masses,  no organomegaly. umbilical hernia noted.  moderate and strangulated  Musculoskeletal: Steady gait and movement  Skin: Cool and moist  Psychiatric: Normal affect, non-agitated, not confused       LABS:  N/a   RADS: N/a Assessment:       Umbilical hernia with gangrene and obstruction [K42.1], will proceed urgently to OR due to erythema and concern for necrosis.  Plan:     1. Umbilical hernia with gangrene and obstruction [K42.1]   Discussed the risk of surgery including recurrence, which can be up to 50% in the case of incisional or complex hernias, possible use of prosthetic materials (mesh) and the increased risk of mesh infxn if used, bleeding, chronic pain, post-op infxn, post-op SBO or ileus, and possible re-operation to address said risks. The risks of general anesthetic, if used, includes  MI, CVA, sudden death or even reaction to anesthetic medications also discussed. Alternatives include continued observation.  Benefits include possible symptom relief, prevention of incarceration, strangulation, enlargement in size over time, and the risk of emergency surgery in the face of strangulation.   Typical post-op recovery time of 3-5  days with 2 weeks of activity restrictions were also discussed.  ED return precautions given for sudden increase in pain, size of hernia with accompanying fever, nausea, and/or vomiting.  The patient verbalized understanding and all questions were answered to the patient's satisfaction.   2. Patient has elected to proceed with surgical treatment. Procedure will be scheduled  labs/images/medications/previous chart entries reviewed personally and relevant changes/updates noted above.

## 2024-04-17 NOTE — Interval H&P Note (Signed)
 No change. Ok to proceed

## 2024-04-17 NOTE — Transfer of Care (Signed)
 Immediate Anesthesia Transfer of Care Note  Patient: Sean Raymond  Procedure(s) Performed: REPAIR, HERNIA, UMBILICAL, ADULT  Patient Location: PACU  Anesthesia Type:General  Level of Consciousness: awake and alert   Airway & Oxygen Therapy: Patient Spontanous Breathing and Patient connected to face mask oxygen  Post-op Assessment: Report given to RN  Post vital signs: Reviewed and stable  Last Vitals:  Vitals Value Taken Time  BP 121/70 04/17/24 19:06  Temp    Pulse 101 04/17/24 19:08  Resp 26 04/17/24 19:08  SpO2 97 % 04/17/24 19:08  Vitals shown include unfiled device data.  Last Pain:  Vitals:   04/17/24 1741  TempSrc: Temporal  PainSc: 0-No pain         Complications: No notable events documented.

## 2024-04-17 NOTE — Progress Notes (Signed)
 Sean Raymond is a  79 y.o. male who presents for  CHIEF COMPLAINT Chief Complaint  Patient presents with   Umbilical Hernia   Abdominal Pain    Subjective: History of Present Illness  Pt in with periumbilical abdominal pain with known hernia. Some redness and swelling. Unable to reduce it at home   Past Medical History:  Diagnosis Date   B-cell lymphoma (CMS/HHS-HCC)    BPH (benign prostatic hyperplasia)    Colon polyps    GERD (gastroesophageal reflux disease)    Hyperlipidemia    Hypertension    benign   Sleep apnea    ON CPAP   Patient Active Problem List  Diagnosis   GERD (gastroesophageal reflux disease)   Esophageal spasm   Lymphoma in remission (CMS/HHS-HCC)   OSA on CPAP   HTN (hypertension)   Hyperlipidemia   BPH (benign prostatic hyperplasia)   B-cell lymphoma (CMS/HHS-HCC)   Sleep apnea   Hypertension   Colon polyps    Past Surgical History:  Procedure Laterality Date   HEMORRHOIDECTOMY EXTERNAL  2008   COLONOSCOPY  11/11/2006   Int Hemorrhoids, Diverticulosis (Dr. EMERSON Remington @ [facility] [place], [place])    COLONOSCOPY  01/11/2012   Adenomatous Polyps: CBF 01/2017; OV made 02/19/2017 @ 8:30am w/Kim Moishe NP (dw)   EGD  02/23/2013   No repeat per RTE   COLONOSCOPY  02/11/2017   12mm Adenomatous Polyp: CBF 02/2020   COLONOSCOPY  2022   COLONOSCOPY  10/28/2020   Tubular adenoma/PHx CPNo repeat due to age/TKT   TONSILLECTOMY       Current Outpatient Medications:    alfuzosin  (UROXATRAL ) 10 mg ER tablet, TAKE 1 TABLET(10 MG) BY MOUTH DAILY, Disp: 90 tablet, Rfl: 3   amLODIPine  (NORVASC ) 5 MG tablet, TAKE 1 TABLET(5 MG) BY MOUTH DAILY, Disp: 90 tablet, Rfl: 3   atorvastatin  (LIPITOR) 10 MG tablet, Take 1 tablet (10 mg total) by mouth once daily, Disp: 90 tablet, Rfl: 1   calcium  carbonate-vitamin D3 (CALTRATE 600+D) 600 mg(1,500mg ) -400 unit tablet, Take 1 tablet by mouth once daily  , Disp: , Rfl:    cetirizine  (ZYRTEC) 10 MG tablet, Take 10 mg by mouth once daily., Disp: , Rfl:    imipramine  (TOFRANIL ) 25 MG tablet, Take 1 tablet (25 mg total) by mouth at bedtime, Disp: 30 tablet, Rfl: 11   multivitamin tablet, Take 1 tablet by mouth once daily., Disp: , Rfl:    omeprazole (PRILOSEC) 40 MG DR capsule, Take 1 capsule (40 mg total) by mouth once daily, Disp: 90 capsule, Rfl: 1   tadalafiL (CIALIS) 10 MG tablet, TAKE 1 TABLET BY MOUTH EVERY DAY 30-45 MINUTES BEFORE ANTICIPATED SEXUAL ACTIVITY, Disp: 90 tablet, Rfl: 0  Adhesive tape-silicones  Social History   Socioeconomic History   Marital status: Married  Tobacco Use   Smoking status: Never    Passive exposure: Never   Smokeless tobacco: Never  Vaping Use   Vaping status: Never Used  Substance and Sexual Activity   Alcohol use: Yes    Alcohol/week: 0.0 standard drinks of alcohol    Comment: occasionally   Drug use: Never   Sexual activity: Defer   Social Drivers of Health   Financial Resource Strain: Low Risk  (12/15/2022)   Overall Financial Resource Strain (CARDIA)    Difficulty of Paying Living Expenses: Not hard at all  Food Insecurity: No Food Insecurity (12/15/2022)   Hunger Vital Sign    Worried About Running Out of  Food in the Last Year: Never true    Ran Out of Food in the Last Year: Never true  Transportation Needs: No Transportation Needs (12/15/2022)   PRAPARE - Administrator, Civil Service (Medical): No    Lack of Transportation (Non-Medical): No  Housing Stability: Unknown (06/15/2023)   Housing Stability Vital Sign    Unable to Pay for Housing in the Last Year: No    Homeless in the Last Year: No    Family History  Problem Relation Name Age of Onset   Brain cancer Mother     Heart failure Father     Colon polyps Maternal Uncle     Colon cancer Maternal Grandmother      A comprehensive ROS was negative except for HPI  PE: BP 120/70   Pulse 92   Wt 89.3 kg (196 lb 12.8 oz)    SpO2 98%   BMI 27.84 kg/m  The patient looks well today. He is in no distress. Neck is supple without adenopathy.  Carotids are 2+ bilaterally without bruits. Lungs are clear. Heart is regular rate and rhythm without murmurs, rubs, or gallops. Abdomen is distended, tender, non-reducible hernia noted with warmth/erythema.  Lower extremities without obvious edema.   Office Visit on 12/17/2023  Component Date Value Ref Range Status   WBC (White Blood Cell Count) 12/17/2023 5.0  4.1 - 10.2 103/uL Final   RBC (Red Blood Cell Count) 12/17/2023 3.91 (L)  4.69 - 6.13 106/uL Final   Hemoglobin 12/17/2023 11.4 (L)  14.1 - 18.1 gm/dL Final   Hematocrit 90/87/7974 34.5 (L)  40.0 - 52.0 % Final   MCV (Mean Corpuscular Volume) 12/17/2023 88.2  80.0 - 100.0 fl Final   MCH (Mean Corpuscular Hemoglobin) 12/17/2023 29.2  27.0 - 31.2 pg Final   MCHC (Mean Corpuscular Hemoglobin * 12/17/2023 33.0  32.0 - 36.0 gm/dL Final   Platelet Count 12/17/2023 166  150 - 450 103/uL Final   RDW-CV (Red Cell Distribution Widt* 12/17/2023 12.4  11.6 - 14.8 % Final   MPV (Mean Platelet Volume) 12/17/2023 10.0  9.4 - 12.4 fl Final   Neutrophils 12/17/2023 3.41  1.50 - 7.80 103/uL Final   Lymphocytes 12/17/2023 0.75 (L)  1.00 - 3.60 103/uL Final   Monocytes 12/17/2023 0.72  0.00 - 1.50 103/uL Final   Eosinophils 12/17/2023 0.08  0.00 - 0.55 103/uL Final   Basophils 12/17/2023 0.04  0.00 - 0.09 103/uL Final   Neutrophil % 12/17/2023 68.0  32.0 - 70.0 % Final   Lymphocyte % 12/17/2023 15.0  10.0 - 50.0 % Final   Monocyte % 12/17/2023 14.4 (H)  4.0 - 13.0 % Final   Eosinophil % 12/17/2023 1.6  1.0 - 5.0 % Final   Basophil% 12/17/2023 0.8  0.0 - 2.0 % Final   Immature Granulocyte % 12/17/2023 0.2  <=0.7 % Final   Immature Granulocyte Count 12/17/2023 0.01  <=0.06 10^3/L Final   Cholesterol, Total 12/17/2023 123  100 - 200 mg/dL Final   Triglyceride 90/87/7974 115  35 - 199 mg/dL Final   HDL  (High Density Lipoprotein) Cho* 12/17/2023 28.7 (L)  29.0 - 71.0 mg/dL Final   LDL Calculated 12/17/2023 71  0 - 130 mg/dL Final   VLDL Cholesterol 12/17/2023 23  mg/dL Final   Cholesterol/HDL Ratio 12/17/2023 4.3   Final   Glucose 12/17/2023 99  70 - 110 mg/dL Final   Sodium 90/87/7974 140  136 - 145 mmol/L Final   Potassium 12/17/2023 3.8  3.6 - 5.1 mmol/L Final   Chloride 12/17/2023 103  97 - 109 mmol/L Final   Carbon Dioxide (CO2) 12/17/2023 31.3  22.0 - 32.0 mmol/L Final   Urea Nitrogen (BUN) 12/17/2023 9  7 - 25 mg/dL Final   Creatinine 90/87/7974 0.8  0.7 - 1.3 mg/dL Final   Glomerular Filtration Rate (eGFR) 12/17/2023 92  >60 mL/min/1.73sq m Final   Calcium  12/17/2023 9.2  8.7 - 10.3 mg/dL Final   AST  90/87/7974 37  8 - 39 U/L Final   ALT  12/17/2023 10  6 - 57 U/L Final   Alk Phos (alkaline Phosphatase) 12/17/2023 87  34 - 104 U/L Final   Albumin 12/17/2023 3.9  3.5 - 4.8 g/dL Final   Bilirubin, Total 12/17/2023 0.5  0.3 - 1.2 mg/dL Final   Protein, Total 12/17/2023 5.5 (L)  6.1 - 7.9 g/dL Final   A/G Ratio 90/87/7974 2.4  1.0 - 5.0 gm/dL Final   PSA (Prostate Specific Antigen), T* 12/17/2023 2.43  0.10 - 4.00 ng/mL Final   Thyroid Stimulating Hormone (TSH) 12/17/2023 1.747  0.450-5.330 uIU/ml uIU/mL Final   Color 12/17/2023 Yellow  Colorless, Straw, Light Yellow, Yellow, Dark Yellow Final   Clarity 12/17/2023 Clear  Clear Final   Specific Gravity 12/17/2023 1.018  1.005 - 1.030 Final   pH, Urine 12/17/2023 6.0  5.0 - 8.0 Final   Protein, Urinalysis 12/17/2023 Trace (!)  Negative mg/dL Final   Glucose, Urinalysis 12/17/2023 Negative  Negative mg/dL Final   Ketones, Urinalysis 12/17/2023 Negative  Negative mg/dL Final   Blood, Urinalysis 12/17/2023 1+ (!)  Negative Final   Nitrite, Urinalysis 12/17/2023 Negative  Negative Final   Leukocyte Esterase, Urinalysis 12/17/2023 2+ (!)  Negative Final   Bilirubin, Urinalysis 12/17/2023 Negative   Negative Final   Urobilinogen, Urinalysis 12/17/2023 0.2  0.2 - 1.0 mg/dL Final   WBC, UA 90/87/7974 4  <=5 /hpf Final   Red Blood Cells, Urinalysis 12/17/2023 23 (H)  <=3 /hpf Final   Bacteria, Urinalysis 12/17/2023 0-5  0 - 5 /hpf Final   Squamous Epithelial Cells, Urinaly* 12/17/2023 0  /hpf Final   Hemoglobin A1C 12/17/2023 5.6  4.2 - 5.6 % Final   Average Blood Glucose (Calc) 12/17/2023 114  mg/dL Final  Office Visit on 06/15/2023  Component Date Value Ref Range Status   Glucose 06/15/2023 83  70 - 110 mg/dL Final   Sodium 96/88/7974 145  136 - 145 mmol/L Final   Potassium 06/15/2023 4.0  3.6 - 5.1 mmol/L Final   Chloride 06/15/2023 108  97 - 109 mmol/L Final   Carbon Dioxide (CO2) 06/15/2023 31.5  22.0 - 32.0 mmol/L Final   Urea Nitrogen (BUN) 06/15/2023 9  7 - 25 mg/dL Final   Creatinine 96/88/7974 0.8  0.7 - 1.3 mg/dL Final   Glomerular Filtration Rate (eGFR) 06/15/2023 92  >60 mL/min/1.73sq m Final   Calcium  06/15/2023 9.1  8.7 - 10.3 mg/dL Final   AST  96/88/7974 22  8 - 39 U/L Final   ALT  06/15/2023 19  6 - 57 U/L Final   Alk Phos (alkaline Phosphatase) 06/15/2023 65  34 - 104 U/L Final   Albumin 06/15/2023 4.1  3.5 - 4.8 g/dL Final   Bilirubin, Total 06/15/2023 0.7  0.3 - 1.2 mg/dL Final   Protein, Total 06/15/2023 5.5 (L)  6.1 - 7.9 g/dL Final   A/G Ratio 96/88/7974 2.9  1.0 - 5.0 gm/dL Final   Cholesterol, Total 06/15/2023 104  100 - 200  mg/dL Final   Triglyceride 96/88/7974 134  35 - 199 mg/dL Final   HDL (High Density Lipoprotein) Cho* 06/15/2023 32.8  29.0 - 71.0 mg/dL Final   LDL Calculated 06/15/2023 44  0 - 130 mg/dL Final   VLDL Cholesterol 06/15/2023 27  mg/dL Final   Cholesterol/HDL Ratio 06/15/2023 3.2   Final   PSA (Prostate Specific Antigen), T* 06/15/2023 3.24  0.10 - 4.00 ng/mL Final   Color 06/15/2023 Light Yellow  Colorless, Straw, Light Yellow, Yellow, Dark Yellow Final   Clarity 06/15/2023 Clear  Clear Final    Specific Gravity 06/15/2023 1.009  1.005 - 1.030 Final   pH, Urine 06/15/2023 7.0  5.0 - 8.0 Final   Protein, Urinalysis 06/15/2023 Negative  Negative mg/dL Final   Glucose, Urinalysis 06/15/2023 Negative  Negative mg/dL Final   Ketones, Urinalysis 06/15/2023 Negative  Negative mg/dL Final   Blood, Urinalysis 06/15/2023 Negative  Negative Final   Nitrite, Urinalysis 06/15/2023 Negative  Negative Final   Leukocyte Esterase, Urinalysis 06/15/2023 1+ (!)  Negative Final   Bilirubin, Urinalysis 06/15/2023 Negative  Negative Final   Urobilinogen, Urinalysis 06/15/2023 0.2  0.2 - 1.0 mg/dL Final   WBC, UA 96/88/7974 2  <=5 /hpf Final   Red Blood Cells, Urinalysis 06/15/2023 1  <=3 /hpf Final   Bacteria, Urinalysis 06/15/2023 0-5  0 - 5 /hpf Final   Squamous Epithelial Cells, Urinaly* 06/15/2023 0  /hpf Final   Thyroid Stimulating Hormone (TSH) 06/15/2023 1.870  0.450-5.330 uIU/ml uIU/mL Final   Hemoglobin A1C 06/15/2023 5.8 (H)  4.2 - 5.6 % Final   Average Blood Glucose (Calc) 06/15/2023 120  mg/dL Final   DIAGNOSIS: Periumbilical abdominal pain  (primary encounter diagnosis)  Umbilical hernia without obstruction and without gangrene   PLAN: Sent to Surgery Dept. STAT. Will be seen by Dr. Tye Ellison Statement:   I personally performed the service. (TP)  [name], MD, MD

## 2024-04-17 NOTE — Anesthesia Preprocedure Evaluation (Signed)
 "                                  Anesthesia Evaluation  Patient identified by MRN, date of birth, ID band Patient awake    Reviewed: Allergy & Precautions, H&P , NPO status , Patient's Chart, lab work & pertinent test results, reviewed documented beta blocker date and time   Airway Mallampati: II  TM Distance: >3 FB Neck ROM: full    Dental  (+) Teeth Intact   Pulmonary sleep apnea and Continuous Positive Airway Pressure Ventilation    Pulmonary exam normal        Cardiovascular Exercise Tolerance: Good hypertension, On Medications Normal cardiovascular exam+ Valvular Problems/Murmurs  Rhythm:regular Rate:Normal     Neuro/Psych negative neurological ROS  negative psych ROS   GI/Hepatic Neg liver ROS,GERD  Medicated,,  Endo/Other  negative endocrine ROS    Renal/GU negative Renal ROS  negative genitourinary   Musculoskeletal   Abdominal   Peds  Hematology negative hematology ROS (+)   Anesthesia Other Findings Past Medical History: 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     Comment:  Left hand base of hand - EDC No date: Transfusion history 5 yrs ago: Vertigo     Comment:  positional vertigo Past Surgical History: No date: CATARACT EXTRACTION; Right 02/14/2020: CATARACT EXTRACTION  W/PHACO; Left     Comment:  Procedure: CATARACT EXTRACTION PHACO AND INTRAOCULAR               LENS PLACEMENT (IOC) LEFT 4.74 01:03.1 7.5%;  Surgeon:               Mittie Gaskin, MD;  Location: [facility];              Service: Ophthalmology;  Laterality: Left; No date: COLONOSCOPY No date: HEMORROIDECTOMY 11/24/2023: IR CV LINE INJECTION No date: LYMPH NODE BIOPSY 10/22/2023: MYRINGOTOMY WITH TUBE PLACEMENT; Right     Comment:  Procedure: MYRINGOTOMY WITH TUBE PLACEMENT;  Surgeon:               Herminio Miu, MD;  Location: [facility];                Service: ENT;  Laterality: Right;  butterfly tube 10/22/2023: NASAL ENDOSCOPY; N/A     Comment:  Procedure: ENDOSCOPY, NOSE;  Surgeon: Herminio Miu,               MD;  Location: [facility];  Service: ENT;                Laterality: N/A;  ENDOSCOPY BIOPSY OF NASOPHARYNGEAL MASS No date:  TONSILLECTOMY BMI    Body Mass Index: 27.30 kg/m     Reproductive/Obstetrics negative OB ROS                              Anesthesia Physical Anesthesia Plan  ASA: 3  Anesthesia Plan: General ETT   Post-op Pain Management:    Induction:   PONV Risk Score and Plan:   Airway Management Planned:   Additional Equipment:   Intra-op Plan:   Post-operative Plan:   Informed Consent: I have reviewed the patients History and Physical, chart, labs and discussed the procedure including the risks, benefits and alternatives for the proposed anesthesia with the patient or authorized representative who has indicated his/her understanding and acceptance.     Dental Advisory Given  Plan Discussed with: CRNA  Anesthesia Plan Comments:         Anesthesia Quick Evaluation  "

## 2024-04-17 NOTE — H&P (Signed)
 Subjective:   CC: Umbilical hernia with gangrene and obstruction [K42.1]  HPI: referred by [name], MD for evaluation of above.   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  He has had an umbilical hernia for 5-6 years that was previously reducible. Four to five days ago he developed pain at the hernia site, which is now non-reducible.  Over the past year he has lost 25 pounds. In the past couple of days he developed diarrhea. He denies nausea and vomiting. He has not taken pain medications and is not on anticoagulation.   Past Medical History:  has a past medical history of B-cell lymphoma (CMS/HHS-HCC), BPH (benign prostatic hyperplasia), Colon polyps, GERD (gastroesophageal reflux disease), Hyperlipidemia, Hypertension, and Sleep apnea.  Past Surgical History:  Past Surgical History:  Procedure Laterality Date   HEMORRHOIDECTOMY EXTERNAL  2008   COLONOSCOPY  11/11/2006   Int Hemorrhoids, Diverticulosis (Dr. EMERSON Remington @ [facility] [place], [place])    COLONOSCOPY  01/11/2012   Adenomatous Polyps: CBF 01/2017; OV made 02/19/2017 @ 8:30am w/Kim Moishe NP (dw)   EGD  02/23/2013   No repeat per RTE   COLONOSCOPY  02/11/2017   12mm Adenomatous Polyp: CBF 02/2020   COLONOSCOPY  2022   COLONOSCOPY  10/28/2020   Tubular adenoma/PHx CPNo repeat due to age/TKT   TONSILLECTOMY      Family History: family history includes Brain cancer in his mother; Colon cancer in his maternal grandmother; Colon polyps in his maternal uncle; Heart failure in his father.  Social History:  reports that he has never smoked. He has never been exposed to tobacco smoke. He has never used smokeless tobacco. He reports current alcohol use. He reports that he does not use drugs.  Current Medications: has a current medication list which includes the following prescription(s): acyclovir , alfuzosin ,  allopurinol , amlodipine , atorvastatin , calcium  carbonate-vitamin d3, cetirizine, multivitamin, omeprazole, ondansetron , prochlorperazine , and tadalafil.  Allergies:  Allergies as of 04/17/2024 - Reviewed 04/17/2024  Allergen Reaction Noted   Adhesive tape-silicones Rash 06/24/2013    ROS:  A 15 point review of systems was performed and pertinent positives and negatives noted in HPI   Objective:     BP (!) 141/78   Pulse 96   Ht 179.1 cm (5' 10.5)   Wt 88.9 kg (196 lb)   BMI 27.73 kg/m   Constitutional :  Alert, cooperative, no distress  Lymphatics/Throat:  Supple, no lymphadenopathy  Respiratory:  clear to auscultation bilaterally  Cardiovascular:  regular rate and rhythm  Gastrointestinal: soft, non-tender; bowel sounds normal; no masses,  no organomegaly. umbilical hernia noted.  moderate and strangulated  Musculoskeletal: Steady gait and movement  Skin: Cool and moist  Psychiatric: Normal affect, non-agitated, not confused       LABS:  N/a   RADS: N/a Assessment:       Umbilical hernia with gangrene and obstruction [K42.1], will proceed urgently to OR due to erythema and concern for necrosis.  Plan:     1. Umbilical hernia with gangrene and obstruction [K42.1]   Discussed the risk of surgery including recurrence, which can be up to 50% in the case of incisional or complex hernias, possible use of prosthetic materials (mesh) and the increased risk of mesh infxn if used, bleeding, chronic pain, post-op infxn, post-op SBO or ileus, and possible re-operation to address said risks. The risks of general anesthetic, if used, includes  MI, CVA, sudden death or even reaction to anesthetic medications also discussed. Alternatives include continued observation.  Benefits include possible symptom relief, prevention of incarceration, strangulation, enlargement in size over time, and the risk of emergency surgery in the face of strangulation.   Typical post-op recovery time of 3-5  days with 2 weeks of activity restrictions were also discussed.  ED return precautions given for sudden increase in pain, size of hernia with accompanying fever, nausea, and/or vomiting.  The patient verbalized understanding and all questions were answered to the patient's satisfaction.   2. Patient has elected to proceed with surgical treatment. Procedure will be scheduled  labs/images/medications/previous chart entries reviewed personally and relevant changes/updates noted above.

## 2024-04-18 ENCOUNTER — Other Ambulatory Visit: Payer: Self-pay

## 2024-04-18 ENCOUNTER — Telehealth: Payer: Self-pay | Admitting: Oncology

## 2024-04-18 ENCOUNTER — Encounter: Payer: Self-pay | Admitting: Surgery

## 2024-04-18 ENCOUNTER — Other Ambulatory Visit: Payer: Self-pay | Admitting: Oncology

## 2024-04-18 DIAGNOSIS — C8218 Follicular lymphoma grade II, lymph nodes of multiple sites: Secondary | ICD-10-CM

## 2024-04-18 DIAGNOSIS — K421 Umbilical hernia with gangrene: Secondary | ICD-10-CM | POA: Diagnosis not present

## 2024-04-18 LAB — CBC
HCT: 35.2 % — ABNORMAL LOW (ref 39.0–52.0)
Hemoglobin: 12.1 g/dL — ABNORMAL LOW (ref 13.0–17.0)
MCH: 31.3 pg (ref 26.0–34.0)
MCHC: 34.4 g/dL (ref 30.0–36.0)
MCV: 91 fL (ref 80.0–100.0)
Platelets: 122 K/uL — ABNORMAL LOW (ref 150–400)
RBC: 3.87 MIL/uL — ABNORMAL LOW (ref 4.22–5.81)
RDW: 13.7 % (ref 11.5–15.5)
WBC: 10.2 K/uL (ref 4.0–10.5)
nRBC: 0 % (ref 0.0–0.2)

## 2024-04-18 LAB — BASIC METABOLIC PANEL WITH GFR
Anion gap: 8 (ref 5–15)
BUN: 10 mg/dL (ref 8–23)
CO2: 27 mmol/L (ref 22–32)
Calcium: 9.2 mg/dL (ref 8.9–10.3)
Chloride: 106 mmol/L (ref 98–111)
Creatinine, Ser: 0.8 mg/dL (ref 0.61–1.24)
GFR, Estimated: >60 mL/min
Glucose, Bld: 146 mg/dL — ABNORMAL HIGH (ref 70–99)
Potassium: 4.3 mmol/L (ref 3.5–5.1)
Sodium: 141 mmol/L (ref 135–145)

## 2024-04-18 MED ORDER — AMOXICILLIN-POT CLAVULANATE 875-125 MG PO TABS
1.0000 | ORAL_TABLET | Freq: Two times a day (BID) | ORAL | 0 refills | Status: AC
  Filled 2024-04-18: qty 28, 14d supply, fill #0

## 2024-04-18 MED ORDER — OYSTER SHELL CALCIUM/D3 500-5 MG-MCG PO TABS
1.0000 | ORAL_TABLET | Freq: Every day | ORAL | Status: DC
  Administered 2024-04-18: 1 via ORAL
  Filled 2024-04-18: qty 1

## 2024-04-18 NOTE — Progress Notes (Signed)
 Mobility Specialist Progress Note:    04/18/24 0942  Mobility  Activity Ambulated with assistance  Level of Assistance Standby assist, set-up cues, supervision of patient - no hands on  Assistive Device None  Distance Ambulated (ft) 30 ft  Range of Motion/Exercises Active;All extremities  Activity Response Tolerated well  Mobility visit 1 Mobility  Mobility Specialist Start Time (ACUTE ONLY) [id number]  Mobility Specialist Stop Time (ACUTE ONLY) 0945  Mobility Specialist Time Calculation (min) (ACUTE ONLY) 7 min   Pt received requesting assistance, required supervision to stand and ambulate with no AD. Tolerated well, asx throughout. Returned pt to bed, all needs met.  Sherrilee Ditty Mobility Specialist Please contact via Special Educational Needs Teacher or  Rehab office at [phone]

## 2024-04-18 NOTE — Discharge Instructions (Addendum)
" °  Diet: Resume home heart healthy regular diet.   Activity: No heavy lifting >20 pounds (children, pets, laundry, garbage) or strenuous activity until follow-up, but light activity and walking are encouraged. Do not drive or drink alcohol if taking narcotic pain medications.  Wound care: Remove honey comb dressing in 48 hrs. Once dressing removed, may shower with soapy water  and pat dry (do not rub incision), but no baths or submerging incision underwater until follow-up. (no swimming) Plan to remove skin staples at follow-up visit.   Medications: Resume all home medications. For mild to moderate pain: acetaminophen  (Tylenol ) or ibuprofen (if no kidney disease). Combining Tylenol  with alcohol can substantially increase your risk of causing liver disease. Narcotic pain medications, if prescribed, can be used for severe pain, though may cause nausea, constipation, and drowsiness. Do not combine Tylenol  and Norco within a 6 hour period as Norco contains Tylenol . If you do not need the narcotic pain medication, you do not need to fill the prescription.Take antibiotic as prescribed for 14-days.   Call office [phone]) at any time if any questions, worsening pain, fevers/chills, bleeding, drainage from incision site, or other concerns.  "

## 2024-04-18 NOTE — Progress Notes (Signed)
 Discharge instructions given to patient, questions answered. IV removed without complications. Patient transporting home via car by his son.

## 2024-04-18 NOTE — TOC CM/SW Note (Signed)
 Transition of Care [facility]) - Inpatient Brief Assessment   Patient Details  Name: Sean Raymond MRN: [id number] Date of Birth: 05/11/1945  Transition of Care [facility]) CM/SW Contact:    Corean ONEIDA Haddock, RN Phone Number: 04/18/2024, 10:55 AM   Clinical Narrative:   Transition of Care Department [facility]) has reviewed patient and no TOC needs have been identified at this time.  If new patient transition needs arise, please place a TOC consult.  Transition of Care Asessment: Insurance and Status: Insurance coverage has been reviewed Patient has primary care physician: Yes     Prior/Current Home Services: No current home services Social Drivers of Health Review: SDOH reviewed no interventions necessary Readmission risk has been reviewed: Yes Transition of care needs: no transition of care needs at this time

## 2024-04-18 NOTE — Telephone Encounter (Signed)
 Pt calling to cancel his appts due to having unexpected hernia surgery. The pt states he doesn't know how long he is going to be in the hospital but he hopes he can do his treatments next week if possible. Can the pt treatments orders be updated. Thanks.

## 2024-04-18 NOTE — Discharge Summary (Signed)
 [facility]-General Surgery  SURGICAL DISCHARGE SUMMARY  Patient ID: Sean Raymond MRN: [id number] DOB/AGE: 1946/03/31 79 y.o.  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 He noted some redness and swelling. Patient was unable to reduce it at home. Referral to General Surgery was sent STAT.  Dr. Tye noted  findings on exam concerning for strangulated umbilical hernia.  Patient was taken to the operating room on 04/17/2024 for open umbilical hernia repair. Strangulated omental contents were noted within the hernia sac, no bowel content. Surgery went well.  Patient tolerated procedure.  Patient is now tolerating regular diet post-op and is able to ambulate. Surgical incision show no signs of infection. Abdominal pain has remained minimal. Patient is clear from surgical standpoint.  Patient will follow-up outpatient with Dr. Tye in 2 weeks.     Physical Examination:  Constitutional: alert, in no acute distress Pulmonary: CTA bilaterally, normal breath sounds Cardiac: regular rate and rhythm Gastrointestinal: soft, non-tender, and non-distended Skin: abdominal incision is clean, dry. Honeycomb dressing is in place with skin staples underneath intact    Allergies as of 04/18/2024       Reactions   Rituximab -pvvr Other (See Comments)   Patient had a hypersensitivity to Rituximab . See progress note on 01/13/23. Patient able to complete infusion.   Tape Rash   bandaids        Medication List     TAKE these medications    acyclovir  400 MG tablet Commonly known as: ZOVIRAX  Take 1 tablet (400 mg total) by mouth daily.   alfuzosin  10 MG 24 hr tablet Commonly known as: UROXATRAL  Take 10 mg by mouth daily.   allopurinol  300 MG tablet Commonly known as: ZYLOPRIM  TAKE 1 TABLET(300 MG) BY MOUTH DAILY   amLODipine  5 MG tablet Commonly known as: NORVASC  Take 5 mg by mouth daily.   amoxicillin -clavulanate 875-125 MG tablet Commonly known as: AUGMENTIN  Take 1 tablet by mouth 2 (two) times daily for 14 days.   atorvastatin  10 MG tablet Commonly known as: LIPITOR Take 10 mg by mouth daily at 6 PM.   Calcium  Carbonate-Vitamin D  600-400 MG-UNIT tablet Take 1 tablet by mouth daily.   cetirizine 10 MG tablet Commonly known as: ZYRTEC Take 10 mg by mouth daily.   ciprofloxacin -dexamethasone  OTIC suspension Commonly known as: CIPRODEX  Place 4 drops into both ears 2 (two) times daily for 5 days. DOS 10/22/23.   Fiber Gummies 2 g Chew Chew by mouth.   imipramine  25 MG tablet Commonly known as: TOFRANIL  Take 25 mg by mouth at bedtime.   Multi-Vitamins Tabs Take 1 tablet by mouth daily.   mupirocin  ointment 2 % Commonly known as: BACTROBAN  Apply once daily with bandage change   omeprazole 40 MG capsule Commonly known as: PRILOSEC Take 40 mg by mouth daily.   ondansetron  8 MG tablet Commonly  known as: Zofran  Take 1 tablet (8 mg total) by mouth every 8 (eight) hours as needed for nausea or vomiting. Start on the third day after chemotherapy.   prochlorperazine  10 MG tablet Commonly known as: COMPAZINE  Take 1 tablet (10 mg total) by mouth every 6 (six) hours as needed for nausea or vomiting.   tadalafil 20 MG  tablet Commonly known as: CIALIS Take 10 mg by mouth daily as needed.          Follow-up Information     Tye, Isami, DO Follow up in 2 week(s).   Specialties: General Surgery, Surgery Why: 2 week f/u s/p open umbilical repair Contact information: [address] [place] [address] [phone]                  Time spent on discharge management including discussion of hospital course, clinical condition, outpatient instructions, prescriptions, and follow up with the patient and members of the medical team: >30 minutes  Jernie Schutt Barrientos PA-C

## 2024-04-19 ENCOUNTER — Inpatient Hospital Stay: Attending: Oncology

## 2024-04-19 ENCOUNTER — Inpatient Hospital Stay

## 2024-04-19 ENCOUNTER — Inpatient Hospital Stay: Admitting: Oncology

## 2024-04-19 LAB — SURGICAL PATHOLOGY

## 2024-04-20 ENCOUNTER — Inpatient Hospital Stay

## 2024-04-21 ENCOUNTER — Encounter: Payer: Self-pay | Admitting: Oncology

## 2024-04-21 NOTE — Anesthesia Postprocedure Evaluation (Signed)
"   Anesthesia Post Note  Patient: Sean Raymond  Procedure(s) Performed: REPAIR, HERNIA, UMBILICAL, ADULT  Patient location during evaluation: PACU Anesthesia Type: General Level of consciousness: awake and alert Pain management: pain level controlled Vital Signs Assessment: post-procedure vital signs reviewed and stable Respiratory status: spontaneous breathing, nonlabored ventilation, respiratory function stable and patient connected to nasal cannula oxygen Cardiovascular status: blood pressure returned to baseline and stable Postop Assessment: no apparent nausea or vomiting Anesthetic complications: no   No notable events documented.   Last Vitals:  Vitals:   04/18/24 0353 04/18/24 0752  BP: (!) 108/59 120/74  Pulse: 66 75  Resp: 18 17  Temp: 37.1 C (!) 36.3 C  SpO2: 96% 94%    Last Pain:  Vitals:   04/18/24 0800  TempSrc:   PainSc: 0-No pain                 Lynwood [name]      "

## 2024-04-22 ENCOUNTER — Other Ambulatory Visit: Payer: Self-pay

## 2024-04-24 ENCOUNTER — Other Ambulatory Visit: Payer: Self-pay

## 2024-04-29 DIAGNOSIS — C8218 Follicular lymphoma grade II, lymph nodes of multiple sites: Secondary | ICD-10-CM

## 2024-05-02 ENCOUNTER — Inpatient Hospital Stay: Attending: Oncology

## 2024-05-02 ENCOUNTER — Encounter: Payer: Self-pay | Admitting: Oncology

## 2024-05-02 ENCOUNTER — Inpatient Hospital Stay

## 2024-05-02 ENCOUNTER — Inpatient Hospital Stay: Admitting: Oncology

## 2024-05-02 VITALS — BP 120/72 | HR 77

## 2024-05-02 VITALS — BP 126/61 | HR 88 | Temp 97.6°F | Ht 70.5 in | Wt 198.9 lb

## 2024-05-02 DIAGNOSIS — C8218 Follicular lymphoma grade II, lymph nodes of multiple sites: Secondary | ICD-10-CM | POA: Diagnosis not present

## 2024-05-02 DIAGNOSIS — Z79630 Long term (current) use of alkylating agent: Secondary | ICD-10-CM | POA: Diagnosis not present

## 2024-05-02 DIAGNOSIS — Z79620 Long term (current) use of immunosuppressive biologic: Secondary | ICD-10-CM | POA: Insufficient documentation

## 2024-05-02 DIAGNOSIS — C8210 Follicular lymphoma grade II, unspecified site: Secondary | ICD-10-CM | POA: Insufficient documentation

## 2024-05-02 DIAGNOSIS — Z5111 Encounter for antineoplastic chemotherapy: Secondary | ICD-10-CM | POA: Insufficient documentation

## 2024-05-02 LAB — CBC WITH DIFFERENTIAL (CANCER CENTER ONLY)
Abs Immature Granulocytes: 0.03 10*3/uL (ref 0.00–0.07)
Basophils Absolute: 0.1 10*3/uL (ref 0.0–0.1)
Basophils Relative: 1 %
Eosinophils Absolute: 0.1 10*3/uL (ref 0.0–0.5)
Eosinophils Relative: 3 %
HCT: 35.1 % — ABNORMAL LOW (ref 39.0–52.0)
Hemoglobin: 11.7 g/dL — ABNORMAL LOW (ref 13.0–17.0)
Immature Granulocytes: 1 %
Lymphocytes Relative: 21 %
Lymphs Abs: 0.8 10*3/uL (ref 0.7–4.0)
MCH: 30.8 pg (ref 26.0–34.0)
MCHC: 33.3 g/dL (ref 30.0–36.0)
MCV: 92.4 fL (ref 80.0–100.0)
Monocytes Absolute: 0.5 10*3/uL (ref 0.1–1.0)
Monocytes Relative: 13 %
Neutro Abs: 2.2 10*3/uL (ref 1.7–7.7)
Neutrophils Relative %: 61 %
Platelet Count: 131 10*3/uL — ABNORMAL LOW (ref 150–400)
RBC: 3.8 MIL/uL — ABNORMAL LOW (ref 4.22–5.81)
RDW: 14.2 % (ref 11.5–15.5)
WBC Count: 3.6 10*3/uL — ABNORMAL LOW (ref 4.0–10.5)
nRBC: 0 % (ref 0.0–0.2)

## 2024-05-02 LAB — CMP (CANCER CENTER ONLY)
ALT: 18 U/L (ref 0–44)
AST: 27 U/L (ref 15–41)
Albumin: 4.1 g/dL (ref 3.5–5.0)
Alkaline Phosphatase: 94 U/L (ref 38–126)
Anion gap: 10 (ref 5–15)
BUN: 8 mg/dL (ref 8–23)
CO2: 26 mmol/L (ref 22–32)
Calcium: 9.2 mg/dL (ref 8.9–10.3)
Chloride: 105 mmol/L (ref 98–111)
Creatinine: 0.85 mg/dL (ref 0.61–1.24)
GFR, Estimated: >60 mL/min
Glucose, Bld: 162 mg/dL — ABNORMAL HIGH (ref 70–99)
Potassium: 3.9 mmol/L (ref 3.5–5.1)
Sodium: 141 mmol/L (ref 135–145)
Total Bilirubin: 0.4 mg/dL (ref 0.0–1.2)
Total Protein: 5.8 g/dL — ABNORMAL LOW (ref 6.5–8.1)

## 2024-05-02 MED ORDER — SODIUM CHLORIDE 0.9 % IV SOLN
INTRAVENOUS | Status: DC
  Filled 2024-05-02: qty 250

## 2024-05-02 MED ORDER — DEXAMETHASONE SOD PHOSPHATE PF 10 MG/ML IJ SOLN
10.0000 mg | Freq: Once | INTRAMUSCULAR | Status: AC
  Administered 2024-05-02: 10 mg via INTRAVENOUS
  Filled 2024-05-02: qty 1

## 2024-05-02 MED ORDER — PALONOSETRON HCL INJECTION 0.25 MG/5ML
0.2500 mg | Freq: Once | INTRAVENOUS | Status: AC
  Administered 2024-05-02: 0.25 mg via INTRAVENOUS
  Filled 2024-05-02: qty 5

## 2024-05-02 MED ORDER — ACETAMINOPHEN 325 MG PO TABS
650.0000 mg | ORAL_TABLET | Freq: Once | ORAL | Status: AC
  Administered 2024-05-02: 650 mg via ORAL
  Filled 2024-05-02: qty 2

## 2024-05-02 MED ORDER — SODIUM CHLORIDE 0.9 % IV SOLN
90.0000 mg/m2 | Freq: Once | INTRAVENOUS | Status: AC
  Administered 2024-05-02: 200 mg via INTRAVENOUS
  Filled 2024-05-02: qty 8

## 2024-05-02 MED ORDER — DIPHENHYDRAMINE HCL 25 MG PO TABS
25.0000 mg | ORAL_TABLET | Freq: Once | ORAL | Status: AC
  Administered 2024-05-02: 25 mg via ORAL
  Filled 2024-05-02: qty 1

## 2024-05-02 MED ORDER — FAMOTIDINE IN NACL 20-0.9 MG/50ML-% IV SOLN
20.0000 mg | Freq: Once | INTRAVENOUS | Status: AC
  Administered 2024-05-02: 20 mg via INTRAVENOUS
  Filled 2024-05-02: qty 50

## 2024-05-02 MED ORDER — SODIUM CHLORIDE 0.9 % IV SOLN
375.0000 mg/m2 | Freq: Once | INTRAVENOUS | Status: AC
  Administered 2024-05-02: 800 mg via INTRAVENOUS
  Filled 2024-05-02: qty 30

## 2024-05-02 NOTE — Patient Instructions (Signed)
 [facility] - A DEPT OF [facility]  Discharge Instructions: Thank you for choosing [facility] to provide your oncology and hematology care.  If you have a lab appointment with the Cancer Center, please go directly to the Cancer Center and check in at the registration area.  Wear comfortable clothing and clothing appropriate for easy access to any Portacath or PICC line.   We strive to give you quality time with your provider. You may need to reschedule your appointment if you arrive late (15 or more minutes).  Arriving late affects you and other patients whose appointments are after yours.  Also, if you miss three or more appointments without notifying the office, you may be dismissed from the clinic at the providers discretion.      For prescription refill requests, have your pharmacy contact our office and allow 72 hours for refills to be completed.    Today you received the following chemotherapy and/or immunotherapy agents Rituxan  and Bendeka .      To help prevent nausea and vomiting after your treatment, we encourage you to take your nausea medication as directed.  BELOW ARE SYMPTOMS THAT SHOULD BE REPORTED IMMEDIATELY: *FEVER GREATER THAN 100.4 F (38 C) OR HIGHER *CHILLS OR SWEATING *NAUSEA AND VOMITING THAT IS NOT CONTROLLED WITH YOUR NAUSEA MEDICATION *UNUSUAL SHORTNESS OF BREATH *UNUSUAL BRUISING OR BLEEDING *URINARY PROBLEMS (pain or burning when urinating, or frequent urination) *BOWEL PROBLEMS (unusual diarrhea, constipation, pain near the anus) TENDERNESS IN MOUTH AND THROAT WITH OR WITHOUT PRESENCE OF ULCERS (sore throat, sores in mouth, or a toothache) UNUSUAL RASH, SWELLING OR PAIN  UNUSUAL VAGINAL DISCHARGE OR ITCHING   Items with * indicate a potential emergency and should be followed up as soon as possible or go to the Emergency Department if any problems should occur.  Please show the CHEMOTHERAPY ALERT CARD or  IMMUNOTHERAPY ALERT CARD at check-in to the Emergency Department and triage nurse.  Should you have questions after your visit or need to cancel or reschedule your appointment, please contact [facility] - A DEPT OF [facility]  [phone] and follow the prompts.  Office hours are 8:00 a.m. to 4:30 p.m. Monday - Friday. Please note that voicemails left after 4:00 p.m. may not be returned until the following business day.  We are closed weekends and major holidays. You have access to a nurse at all times for urgent questions. Please call the main number to the clinic [phone] and follow the prompts.  For any non-urgent questions, you may also contact your provider using MyChart. We now offer e-Visits for anyone 58 and older to request care online for non-urgent symptoms. For details visit [email or web address].   Also download the MyChart app! Go to the app store, search MyChart, open the app, select [facility], and log in with your MyChart username and password.

## 2024-05-02 NOTE — Progress Notes (Signed)
 " [facility]  Telephone:([phone]) [phone] Fax:(336) [phone]  ID: Sean Raymond OB: 05-27-1945  MR#: [id number]  RDW#:[id number]  Patient Care Team: Auston Reyes BIRCH, MD as PCP - General (Internal Medicine) Jacobo Evalene PARAS, MD as Consulting Physician (Oncology)  CHIEF COMPLAINT: Progressive grade 2 follicular lymphoma.  INTERVAL HISTORY: Patient returns to clinic today for further evaluation and consideration of cycle 5, day 1 of Rituxan  and Treanda .  He had emergency surgery for strangulated abdominal hernia several weeks ago, but now is fully recovered and back to his baseline.  He does not complain of weakness or fatigue today. He is tolerating treatments without significant side effects. 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 He has no other neurologic complaints.  He denies any recent fevers or illnesses.  He has a good appetite and denies weight loss.  He denies any chest pain, shortness of breath, cough, or hemoptysis.  He denies any further abdominal pain.  He denies any nausea, vomiting, constipation, or diarrhea.  He has no urinary complaints.  Patient offers no further specific complaints today.  REVIEW OF SYSTEMS:   Review of Systems  Constitutional: Negative.  Negative for diaphoresis, fever, malaise/fatigue and weight loss.  Eyes:  Positive for double vision. Negative for blurred vision and pain.  Respiratory: Negative.  Negative for cough and shortness of breath.   Cardiovascular: Negative.  Negative for chest pain and leg swelling.  Gastrointestinal: Negative.  Negative for abdominal pain.  Genitourinary: Negative.  Negative for dysuria.  Musculoskeletal: Negative.  Negative for neck pain.  Skin: Negative.  Negative for rash.  Neurological: Negative.  Negative for sensory change, focal weakness, weakness and headaches.  Psychiatric/Behavioral: Negative.  The patient is not nervous/anxious.     As per HPI.  Otherwise, a complete review of systems is negative.  PAST MEDICAL HISTORY: Past Medical History:  Diagnosis Date   Follicular lymphoma (HCC) 2009   Patient had chemo + rad tx's and zevaline shots./ no flare ups since 2015   GERD (gastroesophageal reflux disease)    Heart murmur    past finding by Dr Auston   Hemorrhoids    Hypercholesterolemia    Hypertension    started as a preventative   OSA on CPAP    SCC (squamous cell carcinoma) 02/09/2024   left hand posterior - trx ED&C will recheck at followup   SCC (squamous cell carcinoma) 03/16/2024   right proximal dorsal forearm - ED&C done.   Seasonal allergies    Sleep apnea    CPAP   Squamous cell carcinoma of skin 02/10/2021   Left hand base of hand - EDC   Transfusion history    Vertigo 5 yrs ago   positional vertigo    PAST SURGICAL HISTORY: Hemorrhoidectomy.  FAMILY HISTORY: Colon cancer and lung cancer.     ADVANCED DIRECTIVES:    HEALTH MAINTENANCE: Social History   Tobacco Use   Smoking status: Never   Smokeless tobacco: Never  Vaping Use   Vaping status: Never Used  Substance Use Topics   Alcohol use: Yes    Alcohol/week: 2.0 standard drinks of alcohol    Types: 2 Cans of beer per week    Comment: occasional   Drug use: No     Allergies  Allergen Reactions   Rituximab -Pvvr Other (See Comments)    Patient had a hypersensitivity to Rituximab . See progress note on 01/13/23. Patient able to complete infusion.   Tape Rash  bandaids    Current Outpatient Medications  Medication Sig Dispense Refill   acyclovir  (ZOVIRAX ) 400 MG tablet Take 1 tablet (400 mg total) by mouth daily. 30 tablet 3   alfuzosin  (UROXATRAL ) 10 MG 24 hr tablet Take 10 mg by mouth daily.     allopurinol  (ZYLOPRIM ) 300 MG tablet TAKE 1 TABLET(300 MG) BY MOUTH DAILY 90 tablet 0   amLODipine  (NORVASC ) 5 MG tablet Take 5 mg by mouth daily.      amoxicillin -clavulanate (AUGMENTIN ) 875-125 MG tablet Take 1 tablet by mouth 2 (two)  times daily for 14 days. 28 tablet 0   atorvastatin  (LIPITOR) 10 MG tablet Take 10 mg by mouth daily at 6 PM.      Calcium  Carbonate-Vitamin D  600-400 MG-UNIT per tablet Take 1 tablet by mouth daily.      cetirizine (ZYRTEC) 10 MG tablet Take 10 mg by mouth daily.     Multiple Vitamin (MULTI-VITAMINS) TABS Take 1 tablet by mouth daily.      omeprazole (PRILOSEC) 40 MG capsule Take 40 mg by mouth daily.      ondansetron  (ZOFRAN ) 8 MG tablet Take 1 tablet (8 mg total) by mouth every 8 (eight) hours as needed for nausea or vomiting. Start on the third day after chemotherapy. 60 tablet 2   prochlorperazine  (COMPAZINE ) 10 MG tablet Take 1 tablet (10 mg total) by mouth every 6 (six) hours as needed for nausea or vomiting. 60 tablet 2   tadalafil (CIALIS) 20 MG tablet Take 10 mg by mouth daily as needed.     HYDROcodone-acetaminophen  (NORCO/VICODIN) 5-325 MG tablet Take 1 tablet by mouth 3 (three) times daily as needed. (Patient not taking: Reported on 05/02/2024)     imipramine  (TOFRANIL ) 25 MG tablet Take 25 mg by mouth at bedtime. (Patient not taking: Reported on 05/02/2024)     mupirocin  ointment (BACTROBAN ) 2 % Apply once daily with bandage change (Patient not taking: Reported on 05/02/2024) 22 g 1   No current facility-administered medications for this visit.   Facility-Administered Medications Ordered in Other Visits  Medication Dose Route Frequency Provider Last Rate Last Admin   0.9 %  sodium chloride  infusion   Intravenous Continuous Irving Lubbers J, MD 10 mL/hr at 05/02/24 1051 New Bag at 05/02/24 1051   bendamustine  (BENDEKA ) 200 mg in sodium chloride  0.9 % 50 mL (3.4483 mg/mL) chemo infusion  90 mg/m2 (Treatment Plan Recorded) Intravenous Once Tahir Blank J, MD       famotidine  (PEPCID ) IVPB 20 mg premix  20 mg Intravenous Once Leam Madero J, MD 200 mL/hr at 05/02/24 1100 20 mg at 05/02/24 1100   riTUXimab -pvvr (RUXIENCE ) 800 mg in sodium chloride  0.9 % 250 mL (2.4242 mg/mL)  infusion  375 mg/m2 (Treatment Plan Recorded) Intravenous Once Jacobo Evalene PARAS, MD        OBJECTIVE: Vitals:   05/02/24 0954  BP: 126/61  Pulse: 88  Temp: 97.6 F (36.4 C)  SpO2: 100%       Body mass index is 28.14 kg/m.    ECOG FS:0 - Asymptomatic  General: Well-developed, well-nourished, no acute distress. Eyes: Pink conjunctiva, anicteric sclera. HEENT: Normocephalic, moist mucous membranes. Lungs: No audible wheezing or coughing. Heart: Regular rate and rhythm. Abdomen: Soft, nontender, no obvious distention.  Musculoskeletal: No edema, cyanosis, or clubbing. Neuro: Alert, answering all questions appropriately. Cranial nerves grossly intact. Skin: No rashes or petechiae noted. Psych: Normal affect.  LAB RESULTS:  Lab Results  Component Value Date   NA 141  05/02/2024   K 3.9 05/02/2024   CL 105 05/02/2024   CO2 26 05/02/2024   GLUCOSE 162 (H) 05/02/2024   BUN 8 05/02/2024   CREATININE 0.85 05/02/2024   CALCIUM  9.2 05/02/2024   PROT 5.8 (L) 05/02/2024   ALBUMIN 4.1 05/02/2024   AST 27 05/02/2024   ALT 18 05/02/2024   ALKPHOS 94 05/02/2024   BILITOT 0.4 05/02/2024   GFRNONAA >60 05/02/2024   GFRAA >60 12/21/2019    Lab Results  Component Value Date   WBC 3.6 (L) 05/02/2024   NEUTROABS 2.2 05/02/2024   HGB 11.7 (L) 05/02/2024   HCT 35.1 (L) 05/02/2024   MCV 92.4 05/02/2024   PLT 131 (L) 05/02/2024     STUDIES: NM PET Image Restage (PS) Whole Body Result Date: 04/15/2024 CLINICAL DATA:  Subsequent treatment strategy for lymphoma. EXAM: NUCLEAR MEDICINE PET WHOLE BODY TECHNIQUE: 10.0 mCi F-18 FDG was injected intravenously. Full-ring PET imaging was performed from the head to foot after the radiotracer. CT data was obtained and used for attenuation correction and anatomic localization. Fasting blood glucose: 97 mg/dl COMPARISON:  90/90/7974. FINDINGS: Mediastinal blood pool activity: SUV max 2.7 HEAD/NECK: No abnormal hypermetabolism. Incidental CT  findings: Cerebral atrophy.  Mucosal thickening in the paranasal sinuses. CHEST: Small residual left subpectoral/left axillary lymph nodes index lymph node measuring 1.5 cm, SUV max 3.2, previously 5.4 x 5.7 cm and 29.0. No additional abnormal hypermetabolism. Incidental CT findings: Right IJ Port-A-Cath terminates in the right atrium. Atherosclerotic calcification of the aorta. Heart is enlarged. No pericardial or pleural effusion. Volume loss in the left lower lobe and lingula. ABDOMEN/PELVIS: New focal proximal gastric luminal hypermetabolism, SUV max 8.0, possibly corresponding to a 10 mm polypoid lesion (6/95). Correlation with CT is challenging without IV or oral contrast and internal food debris. No residual splenic hypermetabolism. Marked improvement in size and FDG uptake within abdominal, mesenteric and pelvic adenopathy. Index high left periaortic lymph node measures 1.7 cm (6/98), SUV max 3.4, previously 4.8 x 5.8 cm and 19.0. No residual hypermetabolic mesenteric lymph nodes. Residual index left external iliac lymph node measures 2.6 cm (6/163), SUV max 3.1, previously 7.4 x 8.2 cm and 23.1. New focal hypermetabolism in the cecum, SUV max 11.1. No definitive CT correlate. Incidental CT findings: Liver margin is irregular. Tiny gallstone. Bilateral adrenal adenomas. No specific follow-up necessary. Low-attenuation lesions in the kidneys. No specific follow-up necessary. Similar small low-attenuation lesion in the midportion of the spleen, 11 mm. There may be slight gastric wall thickening. Small umbilical hernia contains fluid. Prostate is enlarged. Associated bladder wall thickening and diverticula. SKELETON: No abnormal hypermetabolism. Incidental CT findings: Osteopenia.  Degenerative changes in the spine. EXTREMITIES: No abnormal hypermetabolism. Incidental CT findings: None. IMPRESSION: 1. Marked interval response to therapy as evidenced by small residual mildly hypermetabolic lymph nodes in the  left axilla and abdominopelvic retroperitoneum, with resolution of splenic hypermetabolism. 2. New focal hypermetabolism in the stomach. Correlation with CT is challenging but there may be a small polypoid lesion. Difficult to exclude malignancy. 3. Focal hypermetabolism in the cecum without a definitive CT correlate. 4. Cirrhosis. 5. Cholelithiasis. 6. Bilateral adrenal adenomas. 7. Possible slight gastric wall thickening. 8. Enlarged prostate. Bladder wall thickening is indicative of an element of outlet obstruction. 9.  Aortic atherosclerosis (ICD10-I70.0). Electronically Signed   By: Newell Eke M.D.   On: 04/15/2024 12:01     ONCOLOGY HISTORY: Patient initially received treatment over 15 years ago with R-[facility] in [place] .  He then  underwent rituximab  and Zevalin therapy in June 2014.  He did not require any treatment until imaging with CT scan on December 28, 2022 reported continued progression of disease.  Patient also had a mild pancytopenia and increased fatigue.  He agreed to weekly Rituxan  x 4 for control of disease.  Patient completed week 4 of treatment on February 03, 2023.  Repeat imaging on March 18, 2023 reviewed independently with a mixed response to therapy.  Because of this, patient agreed to maintenance Rituxan  every 8 weeks for a total of 2 years.  Repeat CT scan on Aug 18, 2023 reviewed independently with again a mixed response to therapy.  Further imaging with PET scan on November 24, 2023 reviewed independently and report as above with significant progression of disease.  Patient subsequently initiated Rituxan  and Treanda  on December 29, 2023.  ASSESSMENT: Progressive grade 2 follicular lymphoma.  PLAN:    Progressive grade 2 follicular lymphoma: See oncology history as above.  PET scan on November 24, 2023 revealed significant progression of disease.  Plan to do a total of 6 cycles of Treanda  and Rituxan  on day 1 with Treanda  on day 2 every 28 days.  Patient received  cycle 4 of treatment on December 17 and 18, but then cycle 5 was delayed secondary to hernia surgery.  Restaging PET scan results from April 04, 2024 reviewed independently and report as above with significant improvement of disease burden.  Plan to complete his final 2 cycles of treatment.  Proceed with cycle 5-day 1 of treatment today.  Return to clinic tomorrow for Treanda  only.  Patient will then return to clinic in 4 weeks for further evaluation and consideration of cycle 6, day 1.  He has been instructed to discontinue allopurinol .  Continue acyclovir  until the conclusion of treatments. Axillary lymphadenopathy: Resolved.  Patient does not require XRT at this time. Leukopenia: Mild.  Patient's total white blood cell count is 3.6 today.  Proceed with treatment as above.   Anemia: Chronic and unchanged.  Patient's most recent hemoglobin is 11.7.  Previously patient was noted to have a mildly decreased iron saturation ratio, otherwise his iron panel is within normal limits. Thrombocytopenia: Platelet count improved to 131.  Proceed with treatment as above.   History of reaction to Rituxan : Likely rate based.  Additional premeds have been added for the remainder of his treatments.  This has not recurred. Hearing loss/fluid: Resolved with tube placement by ENT. Abdominal hernia: Surgically repaired.  Patient reports he finishes his postoperative antibiotics today.  Follow-up with surgery as scheduled.   Patient expressed understanding and was in agreement with this plan. He also understands that He can call clinic at any time with any questions, concerns, or complaints.    Evalene JINNY Reusing, MD   05/02/2024 11:05 AM     "

## 2024-05-02 NOTE — Progress Notes (Signed)
 Patient states wanting to discuss PET scan results from 04/04/24.

## 2024-05-03 ENCOUNTER — Inpatient Hospital Stay

## 2024-05-03 VITALS — BP 129/71 | HR 76 | Temp 97.7°F | Resp 18

## 2024-05-03 DIAGNOSIS — C8218 Follicular lymphoma grade II, lymph nodes of multiple sites: Secondary | ICD-10-CM

## 2024-05-03 DIAGNOSIS — Z5111 Encounter for antineoplastic chemotherapy: Secondary | ICD-10-CM | POA: Diagnosis not present

## 2024-05-03 MED ORDER — SODIUM CHLORIDE 0.9 % IV SOLN
INTRAVENOUS | Status: DC
  Filled 2024-05-03: qty 250

## 2024-05-03 MED ORDER — DEXAMETHASONE SOD PHOSPHATE PF 10 MG/ML IJ SOLN
10.0000 mg | Freq: Once | INTRAMUSCULAR | Status: AC
  Administered 2024-05-03: 10 mg via INTRAVENOUS
  Filled 2024-05-03: qty 1

## 2024-05-03 MED ORDER — SODIUM CHLORIDE 0.9 % IV SOLN
90.0000 mg/m2 | Freq: Once | INTRAVENOUS | Status: AC
  Administered 2024-05-03: 200 mg via INTRAVENOUS
  Filled 2024-05-03: qty 8

## 2024-05-03 NOTE — Patient Instructions (Signed)
 [facility] - A DEPT OF [facility]  Discharge Instructions: Thank you for choosing [facility] to provide your oncology and hematology care.  If you have a lab appointment with the Cancer Center, please go directly to the Cancer Center and check in at the registration area.  Wear comfortable clothing and clothing appropriate for easy access to any Portacath or PICC line.   We strive to give you quality time with your provider. You may need to reschedule your appointment if you arrive late (15 or more minutes).  Arriving late affects you and other patients whose appointments are after yours.  Also, if you miss three or more appointments without notifying the office, you may be dismissed from the clinic at the provider's discretion.      For prescription refill requests, have your pharmacy contact our office and allow 72 hours for refills to be completed.    Today you received the following chemotherapy and/or immunotherapy agents BENDEKA      To help prevent nausea and vomiting after your treatment, we encourage you to take your nausea medication as directed.  BELOW ARE SYMPTOMS THAT SHOULD BE REPORTED IMMEDIATELY: *FEVER GREATER THAN 100.4 F (38 C) OR HIGHER *CHILLS OR SWEATING *NAUSEA AND VOMITING THAT IS NOT CONTROLLED WITH YOUR NAUSEA MEDICATION *UNUSUAL SHORTNESS OF BREATH *UNUSUAL BRUISING OR BLEEDING *URINARY PROBLEMS (pain or burning when urinating, or frequent urination) *BOWEL PROBLEMS (unusual diarrhea, constipation, pain near the anus) TENDERNESS IN MOUTH AND THROAT WITH OR WITHOUT PRESENCE OF ULCERS (sore throat, sores in mouth, or a toothache) UNUSUAL RASH, SWELLING OR PAIN  UNUSUAL VAGINAL DISCHARGE OR ITCHING   Items with * indicate a potential emergency and should be followed up as soon as possible or go to the Emergency Department if any problems should occur.  Please show the CHEMOTHERAPY ALERT CARD or IMMUNOTHERAPY  ALERT CARD at check-in to the Emergency Department and triage nurse.  Should you have questions after your visit or need to cancel or reschedule your appointment, please contact [facility] - A DEPT OF [facility]  [phone] and follow the prompts.  Office hours are 8:00 a.m. to 4:30 p.m. Monday - Friday. Please note that voicemails left after 4:00 p.m. may not be returned until the following business day.  We are closed weekends and major holidays. You have access to a nurse at all times for urgent questions. Please call the main number to the clinic [phone] and follow the prompts.  For any non-urgent questions, you may also contact your provider using MyChart. We now offer e-Visits for anyone 43 and older to request care online for non-urgent symptoms. For details visit [email or web address].   Also download the MyChart app! Go to the app store, search "MyChart", open the app, select [facility], and log in with your MyChart username and password.  Bendamustine Injection What is this medication? BENDAMUSTINE (BEN da MUS teen) treats leukemia and lymphoma. It works by slowing down the growth of cancer cells. This medicine may be used for other purposes; ask your health care provider or pharmacist if you have questions. COMMON BRAND NAME(S): Marilynne Halsted, VIVIMUSTA What should I tell my care team before I take this medication? They need to know if you have any of these conditions: Infection, especially a viral infection, such as chickenpox, cold sores, herpes Kidney disease Liver disease An unusual or allergic reaction to bendamustine, mannitol, other  medications, foods, dyes, or preservatives Pregnant or trying to get pregnant Breast-feeding How should I use this medication? This medication is injected into a vein. It is given by your care team in a hospital or clinic setting. Talk to your care team about the use of this medication in  children. Special care may be needed. Overdosage: If you think you have taken too much of this medicine contact a poison control center or emergency room at once. NOTE: This medicine is only for you. Do not share this medicine with others. What if I miss a dose? Keep appointments for follow-up doses. It is important not to miss your dose. Call your care team if you are unable to keep an appointment. What may interact with this medication? Do not take this medication with any of the following: Clozapine This medication may also interact with the following: Atazanavir Cimetidine Ciprofloxacin Enoxacin Fluvoxamine Medications for seizures, such as carbamazepine, phenobarbital Mexiletine Rifampin Tacrine Thiabendazole Zileuton This list may not describe all possible interactions. Give your health care provider a list of all the medicines, herbs, non-prescription drugs, or dietary supplements you use. Also tell them if you smoke, drink alcohol, or use illegal drugs. Some items may interact with your medicine. What should I watch for while using this medication? Visit your care team for regular checks on your progress. This medication may make you feel generally unwell. This is not uncommon, as chemotherapy can affect healthy cells as well as cancer cells. Report any side effects. Continue your course of treatment even though you feel ill unless your care team tells you to stop. You may need blood work while taking this medication. This medication may increase your risk of getting an infection. Call your care team for advice if you get a fever, chills, sore throat, or other symptoms of a cold or flu. Do not treat yourself. Try to avoid being around people who are sick. This medication may cause serious skin reactions. They can happen weeks to months after starting the medication. Contact your care team right away if you notice fevers or flu-like symptoms with a rash. The rash may be red or purple  and then turn into blisters or peeling of the skin. You may also notice a red rash with swelling of the face, lips, or lymph nodes in your neck or under your arms. In some patients, this medication may cause a serious brain infection that may cause death. If you have any problems seeing, thinking, speaking, walking, or standing, tell your care team right away. If you cannot reach your care team, urgently seek other source of medical care. This medication may increase your risk to bruise or bleed. Call your care team if you notice any unusual bleeding. Talk to your care team about your risk of cancer. You may be more at risk for certain types of cancer if you take this medication. Talk to your care team about your risk of skin cancer. You may be more at risk for skin cancer if you take this medication. Talk to your care team if you or your partner wish to become pregnant or think either of you might be pregnant. This medication can cause serious birth defects if taken during pregnancy or for up to 6 months after the last dose. A negative pregnancy test is required before starting this medication. A reliable form of contraception is recommended while taking this medication and for 6 months after the last dose. Talk to your care team about reliable  forms of contraception. Wear a condom while taking this medication and for at least 3 months after the last dose. Do not breast-feed while taking this medication or for at least 1 week after the last dose. This medication may cause infertility. Talk to your care team if you are concerned about your fertility. What side effects may I notice from receiving this medication? Side effects that you should report to your care team as soon as possible: Allergic reactions--skin rash, itching, hives, swelling of the face, lips, tongue, or throat Infection--fever, chills, cough, sore throat, wounds that don't heal, pain or trouble when passing urine, general feeling of  discomfort or being unwell Infusion reactions--chest pain, shortness of breath or trouble breathing, feeling faint or lightheaded Liver injury--right upper belly pain, loss of appetite, nausea, light-colored stool, dark yellow or brown urine, yellowing skin or eyes, unusual weakness or fatigue Low red blood cell level--unusual weakness or fatigue, dizziness, headache, trouble breathing Painful swelling, warmth, or redness of the skin, blisters or sores at the infusion site Rash, fever, and swollen lymph nodes Redness, blistering, peeling, or loosening of the skin, including inside the mouth Tumor lysis syndrome (TLS)--nausea, vomiting, diarrhea, decrease in the amount of urine, dark urine, unusual weakness or fatigue, confusion, muscle pain or cramps, fast or irregular heartbeat, joint pain Unusual bruising or bleeding Side effects that usually do not require medical attention (report to your care team if they continue or are bothersome): Diarrhea Fatigue Headache Loss of appetite Nausea Vomiting This list may not describe all possible side effects. Call your doctor for medical advice about side effects. You may report side effects to FDA at 1-800-FDA-1088. Where should I keep my medication? This medication is given in a hospital or clinic. It will not be stored at home. NOTE: This sheet is a summary. It may not cover all possible information. If you have questions about this medicine, talk to your doctor, pharmacist, or health care provider.  2024 [employer]/Gold Standard (2021-07-15 00:00:00)

## 2024-05-05 ENCOUNTER — Other Ambulatory Visit: Payer: Self-pay

## 2024-05-05 DIAGNOSIS — L02211 Cutaneous abscess of abdominal wall: Secondary | ICD-10-CM

## 2024-05-05 DIAGNOSIS — Z09 Encounter for follow-up examination after completed treatment for conditions other than malignant neoplasm: Secondary | ICD-10-CM

## 2024-05-10 ENCOUNTER — Ambulatory Visit: Admission: RE | Admit: 2024-05-10 | Discharge: 2024-05-10

## 2024-05-10 DIAGNOSIS — Z09 Encounter for follow-up examination after completed treatment for conditions other than malignant neoplasm: Secondary | ICD-10-CM

## 2024-05-10 DIAGNOSIS — L02211 Cutaneous abscess of abdominal wall: Secondary | ICD-10-CM

## 2024-05-10 MED ORDER — IOHEXOL 300 MG/ML  SOLN
100.0000 mL | Freq: Once | INTRAMUSCULAR | Status: AC | PRN
  Administered 2024-05-10: 100 mL via INTRAVENOUS

## 2024-05-10 MED ORDER — IOHEXOL 9 MG/ML PO SOLN
500.0000 mL | ORAL | Status: AC
  Administered 2024-05-10 (×2): 500 mL via ORAL

## 2024-05-17 ENCOUNTER — Inpatient Hospital Stay

## 2024-05-17 ENCOUNTER — Inpatient Hospital Stay: Admitting: Oncology

## 2024-05-18 ENCOUNTER — Inpatient Hospital Stay

## 2024-05-30 ENCOUNTER — Inpatient Hospital Stay: Admitting: Oncology

## 2024-05-30 ENCOUNTER — Inpatient Hospital Stay

## 2024-05-31 ENCOUNTER — Inpatient Hospital Stay

## 2024-06-15 ENCOUNTER — Ambulatory Visit: Admitting: Dermatology

## 2024-09-14 ENCOUNTER — Ambulatory Visit: Admitting: Dermatology
